# Patient Record
Sex: Female | Born: 1950
Health system: Southern US, Community
[De-identification: ages and names within clinical notes are randomized; demographics above are authoritative.]

## PROBLEM LIST (undated history)

## (undated) DIAGNOSIS — F1320 Sedative, hypnotic or anxiolytic dependence, uncomplicated: Secondary | ICD-10-CM

## (undated) DIAGNOSIS — Z79899 Other long term (current) drug therapy: Secondary | ICD-10-CM

## (undated) DIAGNOSIS — R51 Headache: Secondary | ICD-10-CM

## (undated) DIAGNOSIS — L309 Dermatitis, unspecified: Secondary | ICD-10-CM

## (undated) DIAGNOSIS — Z780 Asymptomatic menopausal state: Secondary | ICD-10-CM

## (undated) DIAGNOSIS — Z8719 Personal history of other diseases of the digestive system: Secondary | ICD-10-CM

## (undated) DIAGNOSIS — T7840XA Allergy, unspecified, initial encounter: Secondary | ICD-10-CM

## (undated) DIAGNOSIS — K2960 Other gastritis without bleeding: Secondary | ICD-10-CM

## (undated) DIAGNOSIS — M353 Polymyalgia rheumatica: Secondary | ICD-10-CM

## (undated) DIAGNOSIS — I10 Essential (primary) hypertension: Secondary | ICD-10-CM

## (undated) DIAGNOSIS — K602 Anal fissure, unspecified: Secondary | ICD-10-CM

## (undated) DIAGNOSIS — Z9889 Other specified postprocedural states: Secondary | ICD-10-CM

## (undated) HISTORY — DX: Other long term (current) drug therapy: Z79.899

## (undated) HISTORY — DX: Essential (primary) hypertension: I10

## (undated) HISTORY — DX: Headache: R51

## (undated) HISTORY — DX: Sedative, hypnotic or anxiolytic dependence, uncomplicated: F13.20

## (undated) HISTORY — PX: NO PAST SURGERIES: SHX2092

## (undated) HISTORY — DX: Other gastritis without bleeding: K29.60

## (undated) HISTORY — DX: Other specified postprocedural states: Z98.890

## (undated) HISTORY — DX: Asymptomatic menopausal state: Z78.0

## (undated) HISTORY — DX: Polymyalgia rheumatica: M35.3

## (undated) HISTORY — DX: Dermatitis, unspecified: L30.9

## (undated) HISTORY — DX: Personal history of other diseases of the digestive system: Z87.19

## (undated) HISTORY — DX: Allergy, unspecified, initial encounter: T78.40XA

## (undated) HISTORY — DX: Anal fissure, unspecified: K60.2

---

## 2004-07-27 ENCOUNTER — Ambulatory Visit: Payer: Self-pay | Admitting: Family Medicine

## 2004-11-30 ENCOUNTER — Ambulatory Visit: Payer: Self-pay | Admitting: Family Medicine

## 2004-12-15 ENCOUNTER — Ambulatory Visit: Payer: Self-pay | Admitting: Family Medicine

## 2005-05-10 ENCOUNTER — Ambulatory Visit: Payer: Self-pay | Admitting: Family Medicine

## 2005-09-13 ENCOUNTER — Ambulatory Visit: Payer: Self-pay | Admitting: Family Medicine

## 2005-12-13 ENCOUNTER — Ambulatory Visit: Payer: Self-pay | Admitting: Family Medicine

## 2005-12-27 ENCOUNTER — Ambulatory Visit: Payer: Self-pay | Admitting: Family Medicine

## 2006-06-13 ENCOUNTER — Ambulatory Visit: Payer: Self-pay | Admitting: Family Medicine

## 2006-12-28 ENCOUNTER — Ambulatory Visit: Payer: Self-pay | Admitting: Family Medicine

## 2007-06-05 DIAGNOSIS — R519 Headache, unspecified: Secondary | ICD-10-CM | POA: Insufficient documentation

## 2007-06-05 DIAGNOSIS — R51 Headache: Secondary | ICD-10-CM | POA: Insufficient documentation

## 2007-07-04 ENCOUNTER — Telehealth: Payer: Self-pay | Admitting: Family Medicine

## 2007-07-17 ENCOUNTER — Telehealth: Payer: Self-pay | Admitting: Family Medicine

## 2007-08-08 ENCOUNTER — Telehealth: Payer: Self-pay | Admitting: Family Medicine

## 2007-08-09 ENCOUNTER — Encounter: Payer: Self-pay | Admitting: Family Medicine

## 2007-08-29 ENCOUNTER — Ambulatory Visit: Payer: Self-pay | Admitting: Family Medicine

## 2007-08-29 DIAGNOSIS — G4700 Insomnia, unspecified: Secondary | ICD-10-CM | POA: Insufficient documentation

## 2007-08-29 DIAGNOSIS — I10 Essential (primary) hypertension: Secondary | ICD-10-CM | POA: Insufficient documentation

## 2007-08-29 DIAGNOSIS — F411 Generalized anxiety disorder: Secondary | ICD-10-CM | POA: Insufficient documentation

## 2007-09-25 ENCOUNTER — Telehealth: Payer: Self-pay | Admitting: Family Medicine

## 2007-10-17 ENCOUNTER — Telehealth: Payer: Self-pay | Admitting: Family Medicine

## 2007-10-24 ENCOUNTER — Telehealth: Payer: Self-pay | Admitting: Family Medicine

## 2007-11-15 ENCOUNTER — Telehealth: Payer: Self-pay | Admitting: Family Medicine

## 2007-11-28 ENCOUNTER — Telehealth: Payer: Self-pay | Admitting: Family Medicine

## 2007-12-11 ENCOUNTER — Telehealth: Payer: Self-pay | Admitting: Family Medicine

## 2008-01-10 ENCOUNTER — Telehealth: Payer: Self-pay | Admitting: Family Medicine

## 2008-02-06 ENCOUNTER — Ambulatory Visit: Payer: Self-pay | Admitting: Family Medicine

## 2008-03-05 ENCOUNTER — Telehealth: Payer: Self-pay | Admitting: Family Medicine

## 2008-03-19 ENCOUNTER — Ambulatory Visit: Payer: Self-pay | Admitting: Family Medicine

## 2008-03-19 DIAGNOSIS — N390 Urinary tract infection, site not specified: Secondary | ICD-10-CM | POA: Insufficient documentation

## 2008-03-24 LAB — CONVERTED CEMR LAB
ALT: 12 units/L (ref 0–35)
AST: 17 units/L (ref 0–37)
Alkaline Phosphatase: 61 units/L (ref 39–117)
BUN: 17 mg/dL (ref 6–23)
Basophils Absolute: 0 10*3/uL (ref 0.0–0.1)
Basophils Relative: 0.2 % (ref 0.0–1.0)
CO2: 33 meq/L — ABNORMAL HIGH (ref 19–32)
Chloride: 98 meq/L (ref 96–112)
Creatinine, Ser: 0.9 mg/dL (ref 0.4–1.2)
Direct LDL: 136.1 mg/dL
Eosinophils Absolute: 0 10*3/uL (ref 0.0–0.7)
Eosinophils Relative: 0.2 % (ref 0.0–5.0)
GFR calc non Af Amer: 69 mL/min
HDL: 137 mg/dL (ref >39.0)
Lymphocytes Relative: 18.9 % (ref 12.0–46.0)
Neutrophils Relative %: 74.6 % (ref 43.0–77.0)
Platelets: 380 10*3/uL (ref 150–400)
Potassium: 3.7 meq/L (ref 3.5–5.1)
RBC: 3.67 M/uL — ABNORMAL LOW (ref 3.87–5.11)
Total Bilirubin: 0.8 mg/dL (ref 0.3–1.2)
Triglycerides: 55 mg/dL (ref 0–149)
VLDL: 11 mg/dL (ref 0–40)
WBC: 7 10*3/uL (ref 4.5–10.5)

## 2008-05-01 ENCOUNTER — Telehealth: Payer: Self-pay | Admitting: Family Medicine

## 2008-06-04 ENCOUNTER — Telehealth: Payer: Self-pay | Admitting: Family Medicine

## 2008-09-03 ENCOUNTER — Telehealth: Payer: Self-pay | Admitting: Family Medicine

## 2008-09-04 ENCOUNTER — Ambulatory Visit: Payer: Self-pay | Admitting: Family Medicine

## 2008-11-19 ENCOUNTER — Telehealth: Payer: Self-pay | Admitting: Family Medicine

## 2009-01-29 ENCOUNTER — Encounter: Payer: Self-pay | Admitting: Family Medicine

## 2009-01-29 ENCOUNTER — Telehealth: Payer: Self-pay | Admitting: Family Medicine

## 2009-02-03 ENCOUNTER — Telehealth: Payer: Self-pay

## 2009-02-04 ENCOUNTER — Telehealth: Payer: Self-pay | Admitting: Family Medicine

## 2009-02-11 ENCOUNTER — Telehealth: Payer: Self-pay | Admitting: Family Medicine

## 2009-03-05 ENCOUNTER — Telehealth: Payer: Self-pay | Admitting: Family Medicine

## 2009-03-11 ENCOUNTER — Ambulatory Visit: Payer: Self-pay | Admitting: Family Medicine

## 2009-07-08 ENCOUNTER — Telehealth: Payer: Self-pay | Admitting: Family Medicine

## 2009-09-01 ENCOUNTER — Ambulatory Visit: Payer: Self-pay | Admitting: Family Medicine

## 2009-09-01 LAB — CONVERTED CEMR LAB
Bilirubin Urine: NEGATIVE
Blood in Urine, dipstick: NEGATIVE
Glucose, Urine, Semiquant: NEGATIVE
Ketones, urine, test strip: NEGATIVE
Protein, U semiquant: NEGATIVE
Urobilinogen, UA: 0.2
pH: 6.5

## 2009-09-02 ENCOUNTER — Encounter: Payer: Self-pay | Admitting: Family Medicine

## 2009-09-02 LAB — CONVERTED CEMR LAB
Albumin: 4.5 g/dL (ref 3.5–5.2)
Alkaline Phosphatase: 82 units/L (ref 39–117)
BUN: 23 mg/dL (ref 6–23)
Basophils Absolute: 0 10*3/uL (ref 0.0–0.1)
Chloride: 101 meq/L (ref 96–112)
Cholesterol: 267 mg/dL — ABNORMAL HIGH (ref 0–200)
Creatinine, Ser: 0.9 mg/dL (ref 0.4–1.2)
Direct LDL: 150.3 mg/dL
Eosinophils Absolute: 0 10*3/uL (ref 0.0–0.7)
GFR calc non Af Amer: 68.72 mL/min (ref >60)
Glucose, Bld: 90 mg/dL (ref 70–99)
HDL: 96.7 mg/dL (ref >39.00)
Hemoglobin: 14.4 g/dL (ref 12.0–15.0)
Lymphocytes Relative: 18.9 % (ref 12.0–46.0)
MCHC: 34.1 g/dL (ref 30.0–36.0)
Neutro Abs: 5 10*3/uL (ref 1.4–7.7)
Phosphorus: 3.7 mg/dL (ref 2.3–4.6)
Platelets: 250 10*3/uL (ref 150.0–400.0)
RDW: 12.2 % (ref 11.5–14.6)
TSH: 0.86 microintl units/mL (ref 0.35–5.50)
Total Bilirubin: 1 mg/dL (ref 0.3–1.2)
Total CHOL/HDL Ratio: 3
Triglycerides: 61 mg/dL (ref 0.0–149.0)
VLDL: 12.2 mg/dL (ref 0.0–40.0)

## 2009-11-11 ENCOUNTER — Telehealth: Payer: Self-pay | Admitting: Family Medicine

## 2010-02-16 ENCOUNTER — Ambulatory Visit: Payer: Self-pay | Admitting: Family Medicine

## 2010-02-16 DIAGNOSIS — E559 Vitamin D deficiency, unspecified: Secondary | ICD-10-CM | POA: Insufficient documentation

## 2010-02-16 DIAGNOSIS — E785 Hyperlipidemia, unspecified: Secondary | ICD-10-CM | POA: Insufficient documentation

## 2010-02-16 DIAGNOSIS — T50995A Adverse effect of other drugs, medicaments and biological substances, initial encounter: Secondary | ICD-10-CM | POA: Insufficient documentation

## 2010-02-23 ENCOUNTER — Telehealth (INDEPENDENT_AMBULATORY_CARE_PROVIDER_SITE_OTHER): Payer: Self-pay | Admitting: *Deleted

## 2010-02-23 LAB — CONVERTED CEMR LAB
Cholesterol: 242 mg/dL — ABNORMAL HIGH (ref 0–200)
Triglycerides: 44 mg/dL (ref 0.0–149.0)
VLDL: 8.8 mg/dL (ref 0.0–40.0)
Vit D, 25-Hydroxy: 24 ng/mL — ABNORMAL LOW (ref 30–89)

## 2010-07-27 ENCOUNTER — Ambulatory Visit: Payer: Self-pay | Admitting: Family Medicine

## 2010-07-28 LAB — CONVERTED CEMR LAB
ALT: 18 units/L (ref 0–35)
AST: 21 units/L (ref 0–37)
Bilirubin, Direct: 0.1 mg/dL (ref 0.0–0.3)
Creatinine, Ser: 0.8 mg/dL (ref 0.4–1.2)
Eosinophils Relative: 0.4 % (ref 0.0–5.0)
GFR calc non Af Amer: 83.26 mL/min (ref >60)
Glucose, Bld: 94 mg/dL (ref 70–99)
HDL: 109.5 mg/dL (ref >39.00)
Lymphocytes Relative: 19.2 % (ref 12.0–46.0)
Monocytes Absolute: 0.3 10*3/uL (ref 0.1–1.0)
Monocytes Relative: 5.1 % (ref 3.0–12.0)
Neutrophils Relative %: 75 % (ref 43.0–77.0)
Platelets: 248 10*3/uL (ref 150.0–400.0)
Sodium: 140 meq/L (ref 135–145)
Total Bilirubin: 0.8 mg/dL (ref 0.3–1.2)
Total CHOL/HDL Ratio: 2
Triglycerides: 73 mg/dL (ref 0.0–149.0)
Vit D, 25-Hydroxy: 40 ng/mL (ref 30–89)
WBC: 6.1 10*3/uL (ref 4.5–10.5)

## 2010-09-22 ENCOUNTER — Telehealth: Payer: Self-pay | Admitting: Family Medicine

## 2010-09-23 ENCOUNTER — Telehealth: Payer: Self-pay | Admitting: Family Medicine

## 2010-09-28 ENCOUNTER — Telehealth: Payer: Self-pay | Admitting: Family Medicine

## 2010-10-05 ENCOUNTER — Telehealth: Payer: Self-pay | Admitting: Family Medicine

## 2010-10-26 NOTE — Progress Notes (Signed)
Summary: Pt has several questions re: bp , Enteric Aspirin, Lisinopril  Phone Note Call from Patient Call back at Home Phone [phone]   Caller: Patient Summary of Call: Pt called and wants to know when she should have her bp rechecked? Also pt is suppose to be taking Enteric Aspirin 81mg  low dose, but pt hasnt started taking it yet because she needs to know if it will interfere with other meds? Pt is also wondering if she can discontinue Lisinopril, since her bp reading are a lot better?   Initial call taken by: Lucy Antigua,  November 11, 2009 1:52 PM  Follow-up for Phone Call        will need 3 month  ck up sometime in march and to cont her meds til seen and discusssed at time of visit.  Follow-up by: Pura Spice, RN,  November 11, 2009 2:11 PM    Prescriptions: LISINOPRIL-HYDROCHLOROTHIAZIDE 20-12.5 MG  TABS (LISINOPRIL-HYDROCHLOROTHIAZIDE) 1 once daily for blood pressure  #60 x 0   Entered by:   Lynann Beaver CMA   Authorized by:   Judithann Sheen MD   Signed by:   Lynann Beaver CMA on 11/11/2009   Method used:   [facility]        [facility] [phone][facility] (retail[facility]       [address] [place], [place]  [address]       Ph: [phone] or [phone]       Fax: [phone]   RxID:   [id number]

## 2010-10-26 NOTE — Progress Notes (Signed)
Summary: Re: Vit D  Phone Note Outgoing Call   Initial call taken by: Kathrynn Speed CMA,  Feb 23, 2010 1:47 PM Call placed by: Kathrynn Speed CMA,  Feb 23, 2010 1:47 PM Summary of Call: Per Dr. Cruzita Lederer D is Low Prescription at Drug store take one cap a week for 12 weeks, Lipids OK No Meds needed & continue with Caltrate  Initial call taken by: Kathrynn Speed CMA,  Feb 23, 2010 1:49 PM

## 2010-10-26 NOTE — Assessment & Plan Note (Signed)
Summary: follow up on cholesterol and meds/cjr   Vital Signs:  Patient profile:   60 year old female Weight:      125 pounds BMI:     22.22 O2 Sat:      92 % Temp:     98.1 degrees F Pulse rate:   100 / minute BP sitting:   120 / 80  (left arm)  Vitals Entered By: Pura Spice, RN (Feb 16, 2010 11:17 AM) CC: ? reck chloesterol and vit d level and refill all meds  Is Patient Diabetic? No   History of Present Illness: This 60 year old white married female with chronic tension headaches is in for refill of medications as well as to recheck her cholesterol level. She relates her anxiety depression and crit improved but continues to have headaches. Continues to need medication for insomnia Blood pressure is under good control 2 over laboratory studies  Allergies (verified): No Known Drug Allergies  Past History:  Past Surgical History: Last updated: 06/05/2007 Denies surgical history  Social History: Last updated: 06/05/2007 Occupation: Married  Past Medical History: Eczema Headache benigh essential hyperytension  Review of Systems      See HPI  The patient denies anorexia, fever, weight loss, weight gain, vision loss, decreased hearing, hoarseness, chest pain, syncope, dyspnea on exertion, peripheral edema, prolonged cough, headaches, hemoptysis, abdominal pain, melena, hematochezia, severe indigestion/heartburn, hematuria, incontinence, genital sores, muscle weakness, suspicious skin lesions, transient blindness, difficulty walking, depression, unusual weight change, abnormal bleeding, enlarged lymph nodes, angioedema, breast masses, and testicular masses.    Physical Exam  General:  Well-developed,well-nourished,in no acute distress; alert,appropriate and cooperative throughout examination Head:  Normocephalic and atraumatic without obvious abnormalities. No apparent alopecia or balding. Eyes:  No corneal or conjunctival inflammation noted. EOMI. Perrla. Funduscopic  exam benign, without hemorrhages, exudates or papilledema. Vision grossly normal. Ears:  External ear exam shows no significant lesions or deformities.  Otoscopic examination reveals clear canals, tympanic membranes are intact bilaterally without bulging, retraction, inflammation or discharge. Hearing is grossly normal bilaterally. Nose:  External nasal examination shows no deformity or inflammation. Nasal mucosa are pink and moist without lesions or exudates. Mouth:  Oral mucosa and oropharynx without lesions or exudates.  Teeth in good repair. Breasts:  gynecologist examined Lungs:  Normal respiratory effort, chest expands symmetrically. Lungs are clear to auscultation, no crackles or wheezes. Heart:  Normal rate and regular rhythm. S1 and S2 normal without gallop, murmur, click, rub or other extra sounds. Abdomen:  Bowel sounds positive,abdomen soft and non-tender without masses, organomegaly or hernias noted. Rectal:  GYN Genitalia:  GYN Extremities:  No clubbing, cyanosis, edema, or deformity noted with normal full range of motion of all joints.   Neurologic:  No cranial nerve deficits noted. Station and gait are normal. Plantar reflexes are down-going bilaterally. DTRs are symmetrical throughout. Sensory, motor and coordinative functions appear intact.   Impression & Recommendations:  Problem # 1:  HYPERTENSION, BENIGN (ICD-401.1) Assessment Improved  Her updated medication list for this problem includes:    Lisinopril-hydrochlorothiazide 20-12.5 Mg Tabs (Lisinopril-hydrochlorothiazide) .Marland Kitchen... 1 once daily for blood pressure  Problem # 2:  ANXIETY STATE, UNSPECIFIED (ICD-300.00) Assessment: Improved  Her updated medication list for this problem includes:    Alprazolam 1 Mg Tabs (Alprazolam) .Marland Kitchen... Take 1 tablet four times a day as needed stress and anxiety. no early refill  Problem # 3:  HEADACHE (ICD-784.0) Assessment: Unchanged  Her updated medication list for this problem  includes:    Butalbital-apap-caffeine  50-325-40 Mg Tabs (Butalbital-apap-caffeine) .Marland Kitchen... Take two  tabs every 6-8 hrs as needed migraines. do not take over 4 per day    Aspirin 81 Mg Tbec (Aspirin) .Marland Kitchen... 1 qd  Problem # 4:  INSOMNIA (ICD-780.52) Assessment: Unchanged  Her updated medication list for this problem includes:    Temazepam 30 Mg Caps (Temazepam) .Marland Kitchen... 1 by mouth hs as needed sleep  Complete Medication List: 1)  Butalbital-apap-caffeine 50-325-40 Mg Tabs (Butalbital-apap-caffeine) .... Take two  tabs every 6-8 hrs as needed migraines. do not take over 4 per day 2)  Lisinopril-hydrochlorothiazide 20-12.5 Mg Tabs (Lisinopril-hydrochlorothiazide) .Marland Kitchen.. 1 once daily for blood pressure 3)  Alprazolam 1 Mg Tabs (Alprazolam) .... Take 1 tablet four times a day as needed stress and anxiety. no early refill 4)  Temazepam 30 Mg Caps (Temazepam) .Marland Kitchen.. 1 by mouth hs as needed sleep 5)  Aspirin 81 Mg Tbec (Aspirin) .Marland Kitchen.. 1 qd 6)  Vitamin D (ergocalciferol) 50000 Unit Caps (Ergocalciferol) .Marland KitchenMarland KitchenMarland Kitchen 1 weekly x 12 weeks  Other Orders: Venipuncture (09811) T-Vitamin D (25-Hydroxy) ([address]1478-29562) TLB-Lipid Panel (80061-LIPID)  Patient Instructions: 1)  we'll call results of lab on the lateral 2)  Will refill your medications for blood pressure headaches anxiety depression and insomnia Prescriptions: VITAMIN D (ERGOCALCIFEROL) 50000 UNIT CAPS (ERGOCALCIFEROL) 1 weekly x 12 weeks  #12 x 1   Entered and Authorized by:   Judithann Sheen MD   Signed by:   Judithann Sheen MD on 02/23/2010   Method used:   Electronically to        [facility] [phone]* (retail)       [address] [place], [place]  [address]       Ph: [phone] or [phone]       Fax: [phone]   RxID:   [id number] TEMAZEPAM 30 MG  CAPS (TEMAZEPAM) 1 by mouth hs as needed sleep  #30 x 5   Entered and Authorized by:   Judithann Sheen MD   Signed by:   Judithann Sheen MD on 02/16/2010   Method  used:   Print then Give to Patient   RxID:   [id number] ALPRAZOLAM 1 MG  TABS (ALPRAZOLAM) TAKE 1 TABLET FOUR TIMES A DAY as needed stress and anxiety. NO EARLY REFILL  #120 x 5   Entered and Authorized by:   Judithann Sheen MD   Signed by:   Judithann Sheen MD on 02/16/2010   Method used:   Print then Give to Patient   RxID:   [id number] LISINOPRIL-HYDROCHLOROTHIAZIDE 20-12.5 MG  TABS (LISINOPRIL-HYDROCHLOROTHIAZIDE) 1 once daily for blood pressure  #30 x 11   Entered and Authorized by:   Judithann Sheen MD   Signed by:   Judithann Sheen MD on 02/16/2010   Method used:   Electronically to        [facility] [phone]* (retail)       [address] [place], [place]  [address]       Ph: [phone] or [phone]       Fax: [phone]   RxID:   [id number] BUTALBITAL-APAP-CAFFEINE 50-325-40 MG TABS (BUTALBITAL-APAP-CAFFEINE) TAKE TWO  TABS EVERY 6-8 HRS as needed MIGRAINES. DO Not take over 4 per day  #120 x 5   Entered and Authorized by:   Judithann Sheen MD   Signed  by:   Judithann Sheen MD on 02/16/2010   Method used:   Print then Give to Patient   RxID:   [id number]

## 2010-10-26 NOTE — Assessment & Plan Note (Signed)
Summary: MED CK / REFILL (PT WILL COME IN FASTING FOR POTENTIAL LABS) ...   Vital Signs:  Patient profile:   60 year old female Height:      63 inches (160.02 cm) Weight:      131 pounds (59.55 kg) O2 Sat:      99 % on Room air Temp:     97.6 degrees F (36.44 degrees C) oral Pulse rate:   87 / minute BP sitting:   142 / 82  (left arm) Cuff size:   regular  Vitals Entered By: Josph Macho RMA (July 27, 2010 10:27 AM)  O2 Flow:  Room air CC: Med check/ CF Is Patient Diabetic? No   History of Present Illness: is a 60 year old Caucasian female in today for reevaluation of her medications and medical problems. She denies any recent illness, fevers, chills, congestion, chest pain, palpitations, shortness of breath, GI or GU complaints. She takes alprazolam 4 times a day for ongoing stress and temazepam at bedtime which helps her sleep. She declines SSRIs and says she's not tried them. Denies sedation with this alprazolam and feels it is working well.  Current Medications (verified): 1)  Butalbital-Apap-Caffeine 50-325-40 Mg Tabs (Butalbital-Apap-Caffeine) .... Take Two  Tabs Every 6-8 Hrs As Needed Migraines. Do Not Take Over 4 Per Day 2)  Lisinopril-Hydrochlorothiazide 20-12.5 Mg  Tabs (Lisinopril-Hydrochlorothiazide) .Marland Kitchen.. 1 Once Daily For Blood Pressure 3)  Alprazolam 1 Mg  Tabs (Alprazolam) .... Take 1 Tablet Four Times A Day As Needed Stress and Anxiety. No Early Refill 4)  Temazepam 30 Mg  Caps (Temazepam) .Marland Kitchen.. 1 By Mouth Hs As Needed Sleep 5)  Aspirin 81 Mg Tbec (Aspirin) .Marland Kitchen.. 1 Qd 6)  Vitamin D (Ergocalciferol) 50000 Unit Caps (Ergocalciferol) .Marland Kitchen.. 1 Weekly X 12 Weeks  Allergies (verified): No Known Drug Allergies  Past History:  Past medical history reviewed for relevance to current acute and chronic problems. Social history (including risk factors) reviewed for relevance to current acute and chronic problems.  Past Medical History: Reviewed history from 02/16/2010  and no changes required. Eczema Headache benigh essential hyperytension  Social History: Reviewed history from 06/05/2007 and no changes required. Occupation: Married  Review of Systems      See HPI  Physical Exam  General:  Well-developed,well-nourished,in no acute distress; alert,appropriate and cooperative throughout examination Head:  Normocephalic and atraumatic without obvious abnormalities. No apparent alopecia or balding. Mouth:  Oral mucosa and oropharynx without lesions or exudates.  Teeth in good repair. Neck:  No deformities, masses, or tenderness noted. Lungs:  Normal respiratory effort, chest expands symmetrically. Lungs are clear to auscultation, no crackles or wheezes. Heart:  Normal rate and regular rhythm. S1 and S2 normal without gallop, murmur, click, rub or other extra sounds. Abdomen:  Bowel sounds positive,abdomen soft and non-tender without masses, organomegaly or hernias noted. Extremities:  No clubbing, cyanosis, edema, or deformity noted with normal full range of motion of all joints.   Cervical Nodes:  No lymphadenopathy noted Psych:  Cognition and judgment appear intact. Alert and cooperative with normal attention span and concentration. No apparent delusions, illusions, hallucinations   Impression & Recommendations:  Problem # 1:  HYPERLIPIDEMIA (ICD-272.4)  Orders: Venipuncture (09811) Specimen Handling (91478) TLB-Lipid Panel (80061-LIPID) Avoid trans fats, increase exercise  Problem # 2:  HYPERTENSION, BENIGN (ICD-401.1)  Her updated medication list for this problem includes:    Lisinopril-hydrochlorothiazide 20-12.5 Mg Tabs (Lisinopril-hydrochlorothiazide) .Marland Kitchen... 1 once daily for blood pressure  Orders: Venipuncture (29562) Specimen Handling (13086)  TLB-Renal Function Panel (80069-RENAL) TLB-CBC Platelet - w/Differential (85025-CBCD) TLB-Hepatic/Liver Function Pnl (80076-HEPATIC) TLB-TSH (Thyroid Stimulating Hormone)  (84443-TSH) Improved with repeat eval  Problem # 3:  INSOMNIA (ICD-780.52)  Her updated medication list for this problem includes:    Temazepam 30 Mg Caps (Temazepam) .Marland Kitchen... 1 by mouth hs as needed sleep Reports Temazepam is helpful, refill given  Problem # 4:  ANXIETY STATE, UNSPECIFIED (ICD-300.00)  Her updated medication list for this problem includes:    Alprazolam 1 Mg Tabs (Alprazolam) .Marland Kitchen... Take 1 tablet four times a day as needed stress and anxiety. no early refill Patient encouraged to consider an SSRI, patient declines, will discuss with PMD when he returns  Complete Medication List: 1)  Butalbital-apap-caffeine 50-325-40 Mg Tabs (Butalbital-apap-caffeine) .... Take two  tabs every 6-8 hrs as needed migraines. do not take over 4 per day 2)  Lisinopril-hydrochlorothiazide 20-12.5 Mg Tabs (Lisinopril-hydrochlorothiazide) .Marland Kitchen.. 1 once daily for blood pressure 3)  Alprazolam 1 Mg Tabs (Alprazolam) .... Take 1 tablet four times a day as needed stress and anxiety. no early refill 4)  Temazepam 30 Mg Caps (Temazepam) .Marland Kitchen.. 1 by mouth hs as needed sleep 5)  Aspirin 81 Mg Tbec (Aspirin) .Marland Kitchen.. 1 qd 6)  Vitamin D (ergocalciferol) 50000 Unit Caps (Ergocalciferol) .Marland KitchenMarland KitchenMarland Kitchen 1 weekly x 12 weeks 7)  Citracal Petites/vitamin D 200-250 Mg-unit Tabs (Calcium citrate-vitamin d) .Marland Kitchen.. 1 tab by mouth two times a day  Other Orders: T-Vitamin D (25-Hydroxy) ([address])  Patient Instructions: 1)  Please schedule a follow-up appointment in 2 to 3 months or sooner as needed Prescriptions: TEMAZEPAM 30 MG  CAPS (TEMAZEPAM) 1 by mouth hs as needed sleep  #30 x 1   Entered and Authorized by:   Danise Edge MD   Signed by:   Danise Edge MD on 07/27/2010   Method used:   Print then Give to Patient   RxID:   [id number] ALPRAZOLAM 1 MG  TABS (ALPRAZOLAM) TAKE 1 TABLET FOUR TIMES A DAY as needed stress and anxiety. NO EARLY REFILL  #120 x 1   Entered and Authorized by:   Danise Edge MD   Signed by:    Danise Edge MD on 07/27/2010   Method used:   Print then Give to Patient   RxID:   [id number] BUTALBITAL-APAP-CAFFEINE 50-325-40 MG TABS (BUTALBITAL-APAP-CAFFEINE) TAKE TWO  TABS EVERY 6-8 HRS as needed MIGRAINES. DO Not take over 4 per day  #120 x 1   Entered and Authorized by:   Danise Edge MD   Signed by:   Danise Edge MD on 07/27/2010   Method used:   Print then Give to Patient   RxID:   [id number]    Orders Added: 1)  T-Vitamin D (25-Hydroxy) [44034-74259] 2)  Venipuncture [56387] 3)  Specimen Handling [99000] 4)  TLB-Lipid Panel [80061-LIPID] 5)  TLB-Renal Function Panel [80069-RENAL] 6)  TLB-CBC Platelet - w/Differential [85025-CBCD] 7)  TLB-Hepatic/Liver Function Pnl [80076-HEPATIC] 8)  TLB-TSH (Thyroid Stimulating Hormone) [84443-TSH] 9)  Est. Patient Level IV [56433]

## 2010-10-28 NOTE — Progress Notes (Signed)
Summary: Pt called re: [facility] [place]  Phone Note Outgoing Call   Call placed by: Kyung Rudd, CMA,  September 28, 2010 4:38 PM Call placed to: Patient Summary of Call: left message for pt to call back with pharmacy information Initial call taken by: Kyung Rudd, CMA,  September 28, 2010 4:39 PM  Follow-up for Phone Call        Pt says she is out of town and to pls call med into [facility] in [place], [place] [phone]      If there are any questions, you can contact pt at [phone] cell. Follow-up by: Lucy Antigua,  September 29, 2010 1:26 PM  Additional Follow-up for Phone Call Additional follow up Details #1::        Phone call completed, rx called in  Additional Follow-up by: Alfred Levins, CMA,  September 30, 2010 11:20 AM    Prescriptions: BUTALBITAL-APAP-CAFFEINE 50-325-40 MG TABS (BUTALBITAL-APAP-CAFFEINE) TAKE TWO  TABS EVERY 6-8 HRS as needed MIGRAINES. DO Not take over 4 per day  #30 x 0   Entered by:   Alfred Levins, CMA   Authorized by:   Judithann Sheen MD   Signed by:   Alfred Levins, CMA on 09/30/2010   Method used:   Telephoned to ...       [facility]  [place] [phone]* (retail)       [address] [place], [place]  [address]       Ph: [phone] or [phone]       Fax: [phone]   RxID:   [id number]

## 2010-10-28 NOTE — Progress Notes (Signed)
Summary: Pt req refill of [facility]albital to [facility] in [place] by 09/29/10  Phone Note Refill Request Call back at Home Phone [phone] Message from:  Patient on September 22, 2010 1:43 PM  Refills Requested: Medication #1:  BUTALBITAL-APAP-CAFFEINE 50-325-40 MG TABS TAKE TWO  TABS EVERY 6-8 HRS as needed MIGRAINES. DO Not take over 4 per day   Dosage confirmed as above?Dosage Confirmed   Supply Requested: 1 month Pls call this into [facility] [address] [facility] [place] [phone] by 09/29/10, at the latest.  Pt would like this med to have a few refills on it, so that it will last until her appt with Dr Scotty Court in March 2012   Method Requested: Telephone to Pharmacy Initial call taken by: Lucy Antigua,  September 22, 2010 1:42 PM  Follow-up for Phone Call        denied per dr Scotty Court until 09/29/10 Follow-up by: Kern Reap CMA Duncan Dull),  September 23, 2010 12:47 PM  Additional Follow-up for Phone Call Additional follow up Details #1::        Pt has been informed.  Additional Follow-up by: Lucy Antigua,  September 23, 2010 3:34 PM

## 2010-10-28 NOTE — Progress Notes (Signed)
Summary: Pt has questions re: Butalbital on 09/29/10.   Phone Note Call from Patient Call back at [facility] Phone [phone]   Summary of Call: Pt called and wanted to know, if the refill of Butalbital for 09/29/10 could have some refills put on it to last until her appt in March? Also wanted to know if she could get refills put on scripts for Alprazolam and Temazepam as well. Pt said that Dr Abner Greenspan only gave pt one script for these, with no refills. Pls advise.  Initial call taken by: Lucy Antigua,  September 23, 2010 4:03 PM     Appended Document: Pt has questions re: Butalbital on 09/29/10.     Prescriptions: TEMAZEPAM 30 MG  CAPS (TEMAZEPAM) 1 by mouth hs as needed sleep  #30 x 3   Entered by:   Alfred Levins, CMA   Authorized by:   Judithann Sheen MD   Signed by:   Alfred Levins, CMA on 09/30/2010   Method used:   Telephoned to ...       [facility]  [place] [phone]* (retail)       [address] [place], [place]  [address]       Ph: [phone] or [phone]       Fax: [phone]   RxID:   [id number] ALPRAZOLAM 1 MG  TABS (ALPRAZOLAM) TAKE 1 TABLET FOUR TIMES A DAY as needed stress and anxiety. NO EARLY REFILL  #120 x 3   Entered by:   Alfred Levins, CMA   Authorized by:   Judithann Sheen MD   Signed by:   Alfred Levins, CMA on 09/30/2010   Method used:   Telephoned to ...       [facility]  [place] [phone]* (retail)       [address] [place], [place]  [address]       Ph: [phone] or [phone]       Fax: [phone]   RxID:   [id number]

## 2010-10-28 NOTE — Progress Notes (Signed)
Summary: med refill  Phone Note Refill Request Message from:  [facility] [address]  Refills Requested: Medication #1:  BUTALBITAL-APAP-CAFFEINE 50-325-40 MG TABS TAKE TWO  TABS EVERY 6-8 HRS as needed MIGRAINES. DO Not take over 4 per day pt needs #120 for one month supply. [facility] call [facility] [address] [facility] [place]  Initial call taken by: Heron Sabins,  October 05, 2010 11:30 AM  Follow-up for Phone Call        Rx called to pharmacy Follow-up by: Alfred Levins, CMA,  October 05, 2010 2:34 PM

## 2010-11-30 ENCOUNTER — Encounter: Payer: Self-pay | Admitting: Family Medicine

## 2010-11-30 ENCOUNTER — Ambulatory Visit (INDEPENDENT_AMBULATORY_CARE_PROVIDER_SITE_OTHER): Payer: BC Managed Care – PPO | Admitting: Family Medicine

## 2010-11-30 ENCOUNTER — Telehealth: Payer: Self-pay | Admitting: Family Medicine

## 2010-11-30 VITALS — BP 120/60 | HR 98 | Temp 98.5°F | Resp 16 | Wt 131.0 lb

## 2010-11-30 DIAGNOSIS — F411 Generalized anxiety disorder: Secondary | ICD-10-CM

## 2010-11-30 DIAGNOSIS — F419 Anxiety disorder, unspecified: Secondary | ICD-10-CM

## 2010-11-30 DIAGNOSIS — R51 Headache: Secondary | ICD-10-CM

## 2010-11-30 DIAGNOSIS — G4700 Insomnia, unspecified: Secondary | ICD-10-CM

## 2010-11-30 MED ORDER — ALPRAZOLAM 1 MG PO TABS: 1.0000 mg | ORAL_TABLET | Freq: Four times a day (QID) | ORAL | Status: DC | PRN

## 2010-11-30 MED ORDER — LISINOPRIL-HYDROCHLOROTHIAZIDE 20-12.5 MG PO TABS: 1.0000 | ORAL_TABLET | Freq: Every day | ORAL | Status: DC

## 2010-11-30 MED ORDER — BUTALBITAL-APAP-CAFF-COD 50-325-40-30 MG PO CAPS: 2.0000 | ORAL_CAPSULE | Freq: Four times a day (QID) | ORAL | Status: DC | PRN

## 2010-11-30 MED ORDER — TEMAZEPAM 30 MG PO CAPS: 30.0000 mg | ORAL_CAPSULE | Freq: Every evening | ORAL | Status: DC | PRN

## 2010-11-30 NOTE — Patient Instructions (Signed)
Refilled medications, not over 4 fioricet per day Return for physical in November as well as labs

## 2010-11-30 NOTE — Telephone Encounter (Signed)
Pharmacy called and said that pt says that she has always on plain Fioricet. Pls [facility] plain [facility]et [facility] [address] [place].

## 2010-11-30 NOTE — Progress Notes (Signed)
  Subjective:    Patient ID: Brooke Pennington, female    DOB: October 28, 1950, 60 y.o.   MRN: [id number] This 60 yr old emale is in for refill medications. Continue to be very anxious with panic attacks if she does not take alprazalam, also continues to need fioricet for headaches, also needs temazepam for insomnia, no oher complaints. BP well controlled HPI    Review of SystemsSee HPI     Objective:   Physical Exam well nourishe cooperative pleasant in no distress Heent negative Heart normal size, reg rhthym, no murmurs Lungs clear No edema       Assessment & Plan:  To continue  Same treatment, return in November for preventive health exam

## 2010-12-01 ENCOUNTER — Telehealth: Payer: Self-pay | Admitting: Family Medicine

## 2010-12-01 NOTE — Telephone Encounter (Signed)
°  Pt called to ck on status of previous call.... Pts # [id number].

## 2010-12-01 NOTE — Telephone Encounter (Signed)
Triage vm--------please return regarding her Butabitol without codeine. Please return call.

## 2010-12-02 MED ORDER — BUTALBITAL-APAP-CAFFEINE 50-325-40 MG PO TABS: 2.0000 | ORAL_TABLET | Freq: Four times a day (QID) | ORAL | Status: DC | PRN

## 2010-12-02 NOTE — Telephone Encounter (Signed)
Correction from printed rx for fioricet with codeine. Rx for plain Fioricet #120 with 0 refills called in to pharm

## 2010-12-02 NOTE — Telephone Encounter (Signed)
pls contact pharmacy. If did not fill fioricet then ok to call in plain fioricet without codine #120 no rf.

## 2010-12-02 NOTE — Telephone Encounter (Signed)
Pt called back again and said that Dr Scotty Court usually writes scripts for the [facility] without the codeine in it. Pt req the plain Butabitol APAP with Caffeine 50-325-40 mg, unless Dr Scotty Court is wanting to change the med. Pt is completely out of med and needs this taken care of today. [facility] [address] [address] [place] [phone].

## 2010-12-03 NOTE — Telephone Encounter (Signed)
No this needs to come from Dr. Scotty Court next week.

## 2010-12-03 NOTE — Telephone Encounter (Signed)
Left message on machine for patient

## 2011-05-16 ENCOUNTER — Encounter: Payer: Self-pay | Admitting: Family Medicine

## 2011-05-17 ENCOUNTER — Ambulatory Visit (INDEPENDENT_AMBULATORY_CARE_PROVIDER_SITE_OTHER): Payer: BC Managed Care – PPO | Admitting: Family Medicine

## 2011-05-17 ENCOUNTER — Encounter: Payer: Self-pay | Admitting: Family Medicine

## 2011-05-17 VITALS — BP 118/72 | HR 86 | Temp 98.4°F | Wt 136.0 lb

## 2011-05-17 DIAGNOSIS — F411 Generalized anxiety disorder: Secondary | ICD-10-CM

## 2011-05-17 DIAGNOSIS — G4700 Insomnia, unspecified: Secondary | ICD-10-CM

## 2011-05-17 DIAGNOSIS — R51 Headache: Secondary | ICD-10-CM

## 2011-05-17 DIAGNOSIS — G8929 Other chronic pain: Secondary | ICD-10-CM

## 2011-05-17 DIAGNOSIS — F419 Anxiety disorder, unspecified: Secondary | ICD-10-CM

## 2011-05-17 MED ORDER — BUTALBITAL-APAP-CAFFEINE 50-325-40 MG PO TABS: 2.0000 | ORAL_TABLET | Freq: Four times a day (QID) | ORAL | Status: DC | PRN

## 2011-05-17 MED ORDER — ALPRAZOLAM 1 MG PO TABS: 1.0000 mg | ORAL_TABLET | Freq: Four times a day (QID) | ORAL | Status: DC | PRN

## 2011-05-17 MED ORDER — TEMAZEPAM 30 MG PO CAPS: 30.0000 mg | ORAL_CAPSULE | Freq: Every evening | ORAL | Status: DC | PRN

## 2011-05-17 NOTE — Progress Notes (Signed)
  Subjective:    Patient ID: Brooke Pennington, female    DOB: 02/08/1951, 60 y.o.   MRN: [id number] This 60 year old white married female with known history of chronic headaches of anxiety and depression is in for refill of her medications and plans for a physical examination in November She relates she continues to have headaches but no increased frequency or severity and on systems review no other complaints she is under care of a gynecologist for Pap smear HPI    Review of Systems see history of present illness     Objective:   Physical Exam patient is a well-built well-nourished white female in no distress Vital signs normal Heart and lungs reveal no abnormality No peripheral edema        Assessment & Plan:  Exaggerative depression refill alprazolam 1 mg 4 times a day Chronic headaches Fioricet one every 4-6 hours when necessary for pain or headache Insomnia refill temazepam

## 2011-05-17 NOTE — Patient Instructions (Signed)
Have refilled your medications Fioricet temazepam and alprazolam turn in November for physical examination the lab studies

## 2011-08-03 ENCOUNTER — Encounter: Payer: BC Managed Care – PPO | Admitting: Family Medicine

## 2011-10-20 ENCOUNTER — Telehealth: Payer: Self-pay | Admitting: *Deleted

## 2011-10-20 NOTE — Telephone Encounter (Signed)
I do not prescribe this medication for chronic headaches . this is a rescue  Medication that I rarely  Use because it can cause rebound headaches and cause worsening over time  .  I advise referral to  a headache specialist    I wont be able to continue prescribing this regimen.  (I think Dr Sharene Skeans is in [place] and she is an excellent headache specialist)   In the meantime we can refill for  30 pills no refills  .

## 2011-10-20 NOTE — Telephone Encounter (Signed)
Pt is needing a refill on her headache med, butalbital-acetaminophen-caffeine (FIORICET) 50-325-40 MG per tablet. She is former [place] pt who is establishing with you on 2/5 @ 2pm. She uses [facility] on [address] in [place]. If this is problematic, please call pt. Would like to pick this up Sun., Monday if Sunday not possible. Is going out of town next week.

## 2011-10-20 NOTE — Telephone Encounter (Signed)
Pt is asking to speak to [place].  States she talked to [place] several months ago, and was assigned Dr Fabian Sharp as her MD, and needs to discuss this.

## 2011-10-21 ENCOUNTER — Telehealth: Payer: Self-pay | Admitting: *Deleted

## 2011-10-21 MED ORDER — BUTALBITAL-APAP-CAFFEINE 50-325-40 MG PO TABS: 2.0000 | ORAL_TABLET | Freq: Four times a day (QID) | ORAL | Status: DC | PRN

## 2011-10-21 NOTE — Telephone Encounter (Signed)
Left message on machine for patient to return our call.  #30 tabs called in.

## 2011-10-21 NOTE — Telephone Encounter (Signed)
Please call pt back about her meds.  She is confused.

## 2011-10-24 NOTE — Telephone Encounter (Signed)
See previous phone note.  

## 2011-10-24 NOTE — Telephone Encounter (Signed)
Left message to call back  

## 2011-10-24 NOTE — Telephone Encounter (Signed)
Pt aware of this. She is going to think about the Headache Specialist and discuss this with Dr. Fabian Sharp when she comes in.

## 2011-11-01 ENCOUNTER — Ambulatory Visit (INDEPENDENT_AMBULATORY_CARE_PROVIDER_SITE_OTHER): Payer: BC Managed Care – PPO | Admitting: Internal Medicine

## 2011-11-01 ENCOUNTER — Encounter: Payer: Self-pay | Admitting: Internal Medicine

## 2011-11-01 VITALS — BP 140/80 | HR 66 | Ht 63.0 in | Wt 144.0 lb

## 2011-11-01 DIAGNOSIS — I10 Essential (primary) hypertension: Secondary | ICD-10-CM

## 2011-11-01 DIAGNOSIS — G4700 Insomnia, unspecified: Secondary | ICD-10-CM

## 2011-11-01 DIAGNOSIS — R51 Headache: Secondary | ICD-10-CM

## 2011-11-01 DIAGNOSIS — Z79899 Other long term (current) drug therapy: Secondary | ICD-10-CM | POA: Insufficient documentation

## 2011-11-01 DIAGNOSIS — F411 Generalized anxiety disorder: Secondary | ICD-10-CM

## 2011-11-01 MED ORDER — ALPRAZOLAM 1 MG PO TABS: 1.0000 mg | ORAL_TABLET | Freq: Four times a day (QID) | ORAL | Status: DC | PRN

## 2011-11-01 MED ORDER — BUTALBITAL-APAP-CAFFEINE 50-325-40 MG PO TABS: 2.0000 | ORAL_TABLET | Freq: Two times a day (BID) | ORAL | Status: DC | PRN

## 2011-11-01 MED ORDER — ESCITALOPRAM OXALATE 10 MG PO TABS: 5.0000 mg | ORAL_TABLET | Freq: Every day | ORAL | Status: DC

## 2011-11-01 NOTE — Patient Instructions (Addendum)
Track headaches  And medication usage to minimize use and avoid rebound.  Begin low dose anxiety controller  And xanax .  eventual goal would be to see if anxiety is controlled on less xanax medication. Do not drive with the controlled medciations Consider seeing  Headache specialist .  Get fasting labs at next visit  In about 3-4 weeks.  You are due for this.  Get a mammogram.

## 2011-11-01 NOTE — Assessment & Plan Note (Signed)
Discussed use of this medicine which is a rescue medicine and is a controlled substance and has caffeine in it. It could also interfere with her sleep if she takes it in the evening. She can get rebound headaches with this but she does not think this is happening. At this time he will consider headache clinic but would like a refill of the medication. We've agreed to track her headaches and her medication use to see if she can avoid some triggers and in the meantime try to treat her anxiety disorder.  She will come back in about a month to review this problem also.

## 2011-11-01 NOTE — Assessment & Plan Note (Signed)
Dust use of Restoril as a dependent producing medication and can get rebound sleep problems. In addition to her Xanax and headache medication but try to reduce her pill count. Because she only uses this 2 or 3 times a week she will stop this at this point and optimize safe for treatment of the other problems as we discussed

## 2011-11-01 NOTE — Assessment & Plan Note (Signed)
Discussed at length that daily benzodiazepine and would not be a long-term solution for her anxiety as these her rescue medicines and treat the symptoms but not the underlying problem.  I do not feel that it is in her best interest nor this clinician's comfort level to continue round-the-clock benzodiazepine and without other interventions. She has great fear of not being able to continue her current regimen. However she is willing to try adding on low-dose controller medication extensive counseling given risk benefit. She will track her use of the Xanax not drive with that and we will follow her up in 3-4 weeks. She agrees to this plan.

## 2011-11-01 NOTE — Progress Notes (Signed)
Subjective:    Patient ID: Brooke Pennington, female    DOB: 1951-04-15, 61 y.o.   MRN: [id number]  HPI This is a new patient to my practice transferred from Dr. Scotty Court who is retired.  Her conditions include chronic tension headaches anxiety state and insomnia as well as hypertension.   Hx of headaches for years and usually from stress and using rx insTEAD  Of ot 2-3 advil. Has seem to have onset when stressed .   Med 2-3 x per week.  Not a rebound   Not caffiene .  Anxiety is a trigger  . No panic attacks.  On daily meds  Prn for xanax.  Takes  Usually evenings  Not a lot  A lot of driving.   Stress.  Takes a med and waits a while.    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 and lost marriage  Separated 03-08-2001 . and now remarried.   Hypertension on current regimen for a while seems to be tolerated is due for laboratory studies   HCM : Has had   ? Polyps neg colon  Screen utd on gyne  Last mammogram.  Was 6 years ago Unsure of her tetanus shot but would like to delay it today Review of Systems ROS:  GEN/ HEENT: No fever, significant weight changes sweats headaches vision problems hearing changes, CV/ PULM; No chest pain shortness of breath cough, syncope,edema  change in exercise tolerance. GI /GU: No adominal pain, vomiting, change in bowel habits. No blood in the stool. No significant GU symptoms. SKIN/HEME: ,no acute skin rashes suspicious lesions or bleeding. No lymphadenopathy, nodules, masses.  NEURO/ PSYCH:  No neurologic signs such as weakness numbness. No depression IMM/ Allergy: No unusual infections.  Allergy .   REST of 12 system review negative except as per HPI  Past Medical History  Diagnosis Date  . Eczema   . Headache   . Hypertension     History   Social History  . Marital Status: Married    Spouse Name: N/A    Number of Children: N/A  .  Years of Education: N/A   Occupational History  . Not on file.   Social History Main Topics  . Smoking status: Never Smoker   . Smokeless tobacco: Never Used  . Alcohol Use: 0.6 oz/week    1 Glasses of wine per week  . Drug Use: No  . Sexually Active: Yes   Other Topics Concern  . Not on file   Social History Narrative   Married h[employer] 2 College degreeG2P2    Past Surgical History  Procedure Date  . No past surgeries     Family History  Problem Relation Age of Onset  . Coronary artery disease Father   . Heart disease Father   . COPD Mother     No Known Allergies  Current Outpatient Prescriptions on File Prior to Visit  Medication Sig Dispense Refill  . aspirin 81 MG tablet Take 81 mg by mouth daily.        . calcium citrate-vitamin D 200-200 MG-UNIT TABS Take 1 tablet by mouth 2 (two) times daily.        Marland Kitchen lisinopril-hydrochlorothiazide (PRINZIDE,ZESTORETIC) 20-12.5 MG per tablet Take 1 tablet by mouth daily. For hypertension  30 tablet  11  . temazepam (RESTORIL) 30 MG capsule Take 1 capsule (30 mg  total) by mouth at bedtime as needed.  30 capsule  5  . escitalopram (LEXAPRO) 10 MG tablet Take 0.5 tablets (5 mg total) by mouth daily. Can increase to 1 po qd after 2 weeks  30 tablet  3    BP 140/80  Pulse 66  Ht 5\' 3"  (1.6 m)  Wt 144 lb (65.318 kg)  BMI 25.51 kg/m2       Objective:   Physical Exam WDWn in na d. She appears mildly anxious but has good eye contact she appears healthy HEENT: Normocephalic ;atraumatic , Eyes;  PERRL, EOMs  Full, lids and conjunctiva clear,,Ears: no deformities, canals nl, TM landmarks normal, Nose: no deformity or discharge  Mouth : OP clear without lesion or edema . Neck: Supple without adenopathy or masses or bruits Chest:  Clear to A&P without wheezes rales or rhonchi CV:  S1-S2 no gallops or murmurs peripheral perfusion is normal Abdomen:  Sof,t normal bowel sounds without hepatosplenomegaly, no guarding rebound or masses no  CVA tenderness No clubbing cyanosis or edema Oriented x 3 and no noted deficits in memory, attention, and speech. NEURO: oriented x 3 CN 3-12 appear intact. No focal muscle weakness or atrophy. DTRs symmetrical. Gait WNL.  Grossly non focal. No tremor or abnormal movement. Psych oriented no tremor or not depressed normal speech no tremor.  Lab Results  Component Value Date   WBC 6.1 07/27/2010   HGB 13.3 07/27/2010   HCT 38.2 07/27/2010   PLT 248.0 07/27/2010   GLUCOSE 94 07/27/2010   CHOL 267* 07/27/2010   TRIG 73.0 07/27/2010   HDL 109.50 07/27/2010   LDLDIRECT 128.9 07/27/2010   ALT 18 07/27/2010   AST 21 07/27/2010   NA 140 07/27/2010   K 4.9 07/27/2010   CL 101 07/27/2010   CREATININE 0.8 07/27/2010   BUN 19 07/27/2010   CO2 32 07/27/2010   TSH 1.16 07/27/2010        Assessment & Plan:  Chronic headaches   appear to be afternoon bitemporal tension headaches that she call them.  She's fallen into management of as needed medication of a rescue medicine that is controlled and contains caffeine. There no alarm symptoms with her headaches otherwise. The assessment and plan we'll try to reduce pill count and manage triggers. Unclear if this is affecting her sleep   Hypertension  controlled due for labs continue   Anxiety on around-the-clock Xanax for a number of years remote history of a medication and that was an antidepressant that "didn't work"  he is willing to add a controller medication and that we will try discussion about medicines good effects bad effects rebound anxiety et Karie Soda.  Sleep disturbance intermittent unclear if related to medications or underlying problem.   History of mildly elevated lipids we'll repeat a followup  Health care maintenance this at that time needs a mammogram we'll hold off on tdap    Total visit > 50% spent counseling and coordinating care     today as per patient doesn't want a flu shot

## 2011-11-01 NOTE — Assessment & Plan Note (Signed)
If her laboratory tests that got delayed because of Dr. Charmian Muff retirement. We'll plan lab tests fasting at her next visit

## 2011-11-07 ENCOUNTER — Telehealth: Payer: Self-pay | Admitting: *Deleted

## 2011-11-07 NOTE — Telephone Encounter (Signed)
Per Dr. Fabian Sharp try taking 1/4 tab and see if goes away for 1 week.

## 2011-11-07 NOTE — Telephone Encounter (Signed)
Left message to call back  

## 2011-11-07 NOTE — Telephone Encounter (Signed)
Lexapro 10 mg 1/2 daily is making pt nauseated x one week, and is asking if she should remain on it.

## 2011-11-16 NOTE — Telephone Encounter (Signed)
Pt aware of this. 

## 2011-11-28 ENCOUNTER — Encounter: Payer: Self-pay | Admitting: Internal Medicine

## 2011-11-28 ENCOUNTER — Ambulatory Visit (INDEPENDENT_AMBULATORY_CARE_PROVIDER_SITE_OTHER): Payer: BC Managed Care – PPO | Admitting: Internal Medicine

## 2011-11-28 VITALS — BP 100/60 | HR 72 | Temp 98.3°F | Ht 63.0 in | Wt 145.0 lb

## 2011-11-28 DIAGNOSIS — I10 Essential (primary) hypertension: Secondary | ICD-10-CM

## 2011-11-28 DIAGNOSIS — R51 Headache: Secondary | ICD-10-CM

## 2011-11-28 DIAGNOSIS — E559 Vitamin D deficiency, unspecified: Secondary | ICD-10-CM

## 2011-11-28 DIAGNOSIS — Z79899 Other long term (current) drug therapy: Secondary | ICD-10-CM

## 2011-11-28 DIAGNOSIS — F411 Generalized anxiety disorder: Secondary | ICD-10-CM

## 2011-11-28 DIAGNOSIS — G4700 Insomnia, unspecified: Secondary | ICD-10-CM

## 2011-11-28 DIAGNOSIS — E785 Hyperlipidemia, unspecified: Secondary | ICD-10-CM

## 2011-11-28 DIAGNOSIS — F4321 Adjustment disorder with depressed mood: Secondary | ICD-10-CM

## 2011-11-28 LAB — CBC WITH DIFFERENTIAL/PLATELET
Basophils Absolute: 0 10*3/uL (ref 0.0–0.1)
Eosinophils Absolute: 0 10*3/uL (ref 0.0–0.7)
Lymphocytes Relative: 21.1 % (ref 12.0–46.0)
MCHC: 34.2 g/dL (ref 30.0–36.0)
MCV: 100.3 fl — ABNORMAL HIGH (ref 78.0–100.0)
Monocytes Absolute: 0.3 10*3/uL (ref 0.1–1.0)
Neutro Abs: 3.7 10*3/uL (ref 1.4–7.7)
Neutrophils Relative %: 71.4 % (ref 43.0–77.0)
RDW: 12.6 % (ref 11.5–14.6)

## 2011-11-28 LAB — BASIC METABOLIC PANEL
CO2: 31 mEq/L (ref 19–32)
Calcium: 9.2 mg/dL (ref 8.4–10.5)
Chloride: 103 mEq/L (ref 96–112)
Creatinine, Ser: 0.8 mg/dL (ref 0.4–1.2)
Glucose, Bld: 84 mg/dL (ref 70–99)

## 2011-11-28 LAB — LIPID PANEL
Total CHOL/HDL Ratio: 3
Triglycerides: 58 mg/dL (ref 0.0–149.0)

## 2011-11-28 LAB — HEPATIC FUNCTION PANEL
Albumin: 4.1 g/dL (ref 3.5–5.2)
Alkaline Phosphatase: 78 U/L (ref 39–117)
Bilirubin, Direct: 0 mg/dL (ref 0.0–0.3)
Total Protein: 6.7 g/dL (ref 6.0–8.3)

## 2011-11-28 LAB — TSH: TSH: 1.27 u[IU]/mL (ref 0.35–5.50)

## 2011-11-28 MED ORDER — BUTALBITAL-APAP-CAFFEINE 50-325-40 MG PO TABS: 2.0000 | ORAL_TABLET | Freq: Two times a day (BID) | ORAL | Status: DC | PRN

## 2011-11-28 NOTE — Progress Notes (Signed)
  Subjective:    Patient ID: Rolanda Jay, female    DOB: 1951/01/03, 61 y.o.   MRN: [id number]  HPI Patient comes in today for follow up of  multiple medical problems.    BP  seems better today needs refill of meds . Was on ecotrin  And  To ask.  Headaches  :   Not that many stopped the esgic fo a few weeks cause ran out.  No rebound does caffiene  qd . Took  esgic 2 bid   Last one a week or so ago.   hasn't had many HAs  Just 2  Minor stress headache. Hasnt done HA calendar  Yet.  Asks for refill   When goes to bed  Mind races.    Dog died last fall and mom and had rough time   Giving time .     6 years.    Talks with sister.  Xanax about 4 x per day .  Or if cant sleep.   Lexapro had initial  nausea and had gi issues.poss a virus and now better did 1/4 tab  and just recently 10 mg per day.  Less than a week.  Seems ok now.   Review of Systems Neg cp sob falling vision change bleeding  Rest as per hpi Past history family history social history reviewed in the electronic medical record.     Objective:   Physical Exam  WDWN in nad    Mildly anxious good eye contact tearful when speaks of mom. appropriate conversation HEENT: Normocephalic ;atraumatic , Eyes;  PERRL, EOMs  Full, lids and conjunctiva clear,,Ears: no deformities, canals nl, TM landmarks normal, Nose: no deformity or discharge  Mouth : OP clear without lesion or edema . Neck: Supple without adenopathy or masses or bruits Chest:  Clear to A&P without wheezes rales or rhonchi CV:  S1-S2 no gallops or murmurs peripheral perfusion is normal Abdomen:  Sof,t normal bowel sounds without hepatosplenomegaly, no guarding rebound or masses no CVA tenderness No clubbing cyanosis or edema Neuro non focal no tremor       Assessment & Plan:  HT  Stable continue same med if labs ok   Headaches  Continue tracking   And use of meds  Use nsaid first and then fiorinal as rescue  Asks for 60 pills with refill  . Disc that this  Should  last quite a while if  Has controlled also caffeine can interfere with sleep.  Sleep  some anxiety other issues  Since she is taking reg benzos do not add the temazepam also .  Avoid her caffeine ( tea ) in pm .  Before 4-6 pm.  Anxiety   hasnt  Been on lexapro full dose for more than a week so will continue and assess in 2 months.   ? Vit d   Hx of osteopenia . Asks about other supplements not a lot of dairy.  Get level and then give recs   Check level today .  Grieving  Mom of 6 years recent pet.  Counseled.  Supports etc.   Hyperlipidemia  lsi    Check today   Asks about ASA   Risk benefit uncertain in her situation . Would temporarily hold while using a nsaid prn for HAs .   rsik of bleeding but eventually ok to uses low dose   If wishes .  At 67 may have more benefit than risk .

## 2011-11-28 NOTE — Patient Instructions (Addendum)
Will notify you  of labs when available.  Limit esgic medicine for headache as we discussed and it does have  caffiene in it . And this could affect sleep. Calendar  Headaches and what you take for the headaches and be aware of caffiene.  Try aleve as needed for basic headache .   1-2 aleve      Max twice a day  OR  Ibuprofen 600- 800 mg every 8 hours if needed.  Will notify you  of labs when available. Then make plan .

## 2011-11-29 ENCOUNTER — Encounter: Payer: Self-pay | Admitting: Internal Medicine

## 2011-11-29 ENCOUNTER — Encounter: Payer: BC Managed Care – PPO | Admitting: Family Medicine

## 2011-11-29 DIAGNOSIS — D7589 Other specified diseases of blood and blood-forming organs: Secondary | ICD-10-CM

## 2011-11-29 DIAGNOSIS — F4321 Adjustment disorder with depressed mood: Secondary | ICD-10-CM | POA: Insufficient documentation

## 2011-11-29 HISTORY — DX: Other specified diseases of blood and blood-forming organs: D75.89

## 2011-11-29 LAB — VITAMIN D 25 HYDROXY (VIT D DEFICIENCY, FRACTURES): Vit D, 25-Hydroxy: 17 ng/mL — ABNORMAL LOW (ref 30–89)

## 2011-11-29 MED ORDER — LISINOPRIL-HYDROCHLOROTHIAZIDE 20-12.5 MG PO TABS: 1.0000 | ORAL_TABLET | Freq: Every day | ORAL | Status: DC

## 2011-12-01 NOTE — Progress Notes (Signed)
Quick Note:  Left message to call back. ______ 

## 2011-12-02 ENCOUNTER — Telehealth: Payer: Self-pay | Admitting: *Deleted

## 2011-12-02 ENCOUNTER — Other Ambulatory Visit: Payer: Self-pay | Admitting: Internal Medicine

## 2011-12-02 DIAGNOSIS — D649 Anemia, unspecified: Secondary | ICD-10-CM

## 2011-12-02 DIAGNOSIS — E559 Vitamin D deficiency, unspecified: Secondary | ICD-10-CM

## 2011-12-02 DIAGNOSIS — D7589 Other specified diseases of blood and blood-forming organs: Secondary | ICD-10-CM

## 2011-12-02 NOTE — Telephone Encounter (Signed)
Pt is wanting to know when she should have her lipids rechecked since they were elevated?

## 2011-12-06 NOTE — Telephone Encounter (Signed)
i would have her  Intensify healthy eating and exercise and check this in 6 months or so .  We can coordinate this at her next OV .

## 2011-12-07 NOTE — Telephone Encounter (Signed)
Left message to call back  

## 2011-12-07 NOTE — Telephone Encounter (Signed)
Pt aware of this. 

## 2011-12-19 ENCOUNTER — Telehealth: Payer: Self-pay

## 2011-12-19 NOTE — Telephone Encounter (Signed)
Need more info ;is she asking for more ? How many ?

## 2011-12-19 NOTE — Telephone Encounter (Signed)
VM:  Pt states she has a question about her medication.  Pt was standing at her sink and opened her butalbital and the medication fell in the sink into some water.  Pt states she has been taking the medication as Dr. Fabian Sharp has recommended.  Pls advise.

## 2011-12-20 NOTE — Telephone Encounter (Signed)
Called pt to get more information.

## 2011-12-27 ENCOUNTER — Telehealth: Payer: Self-pay | Admitting: *Deleted

## 2011-12-27 DIAGNOSIS — Z79899 Other long term (current) drug therapy: Secondary | ICD-10-CM

## 2011-12-27 DIAGNOSIS — E785 Hyperlipidemia, unspecified: Secondary | ICD-10-CM

## 2011-12-27 DIAGNOSIS — E559 Vitamin D deficiency, unspecified: Secondary | ICD-10-CM

## 2011-12-27 DIAGNOSIS — R51 Headache: Secondary | ICD-10-CM

## 2011-12-27 DIAGNOSIS — I10 Essential (primary) hypertension: Secondary | ICD-10-CM

## 2011-12-27 DIAGNOSIS — F4321 Adjustment disorder with depressed mood: Secondary | ICD-10-CM

## 2011-12-27 DIAGNOSIS — G4700 Insomnia, unspecified: Secondary | ICD-10-CM

## 2011-12-27 DIAGNOSIS — F411 Generalized anxiety disorder: Secondary | ICD-10-CM

## 2011-12-27 NOTE — Telephone Encounter (Signed)
Left a message for pt to return call 

## 2011-12-27 NOTE — Telephone Encounter (Signed)
Pt is asking for a refill of Fioricet.  States she dropped her whole bottle down the sink.

## 2011-12-27 NOTE — Telephone Encounter (Signed)
Ok to refill x 1  early

## 2011-12-28 MED ORDER — BUTALBITAL-APAP-CAFFEINE 50-325-40 MG PO TABS: ORAL_TABLET | ORAL | Status: DC

## 2011-12-28 NOTE — Telephone Encounter (Signed)
Rx called into pharmacy. Pt aware.  

## 2012-01-09 ENCOUNTER — Other Ambulatory Visit: Payer: Self-pay

## 2012-01-09 NOTE — Telephone Encounter (Signed)
Pt called requesting alprazolam 1 mg tablet- take 1 tablet qid.  Pt last seen 11/28/2011.  Rx last filled 11/01/11 #90 x 1 rf.  Pls advise.

## 2012-01-10 NOTE — Telephone Encounter (Signed)
Can refill x 1  Disp 90

## 2012-01-11 MED ORDER — ALPRAZOLAM 1 MG PO TABS: 1.0000 mg | ORAL_TABLET | Freq: Four times a day (QID) | ORAL | Status: DC | PRN

## 2012-01-11 NOTE — Telephone Encounter (Signed)
Rx called in to pharmacy. 

## 2012-01-16 ENCOUNTER — Other Ambulatory Visit: Payer: Self-pay

## 2012-01-16 DIAGNOSIS — E785 Hyperlipidemia, unspecified: Secondary | ICD-10-CM

## 2012-01-16 DIAGNOSIS — Z79899 Other long term (current) drug therapy: Secondary | ICD-10-CM

## 2012-01-16 DIAGNOSIS — F4321 Adjustment disorder with depressed mood: Secondary | ICD-10-CM

## 2012-01-16 DIAGNOSIS — I10 Essential (primary) hypertension: Secondary | ICD-10-CM

## 2012-01-16 DIAGNOSIS — R51 Headache: Secondary | ICD-10-CM

## 2012-01-16 DIAGNOSIS — E559 Vitamin D deficiency, unspecified: Secondary | ICD-10-CM

## 2012-01-16 DIAGNOSIS — F411 Generalized anxiety disorder: Secondary | ICD-10-CM

## 2012-01-16 DIAGNOSIS — G4700 Insomnia, unspecified: Secondary | ICD-10-CM

## 2012-01-16 NOTE — Telephone Encounter (Signed)
Pt called stating she is coming in next week and needs her butalbital-acetaminophen- caffeine called in to the pharmacy. Pt states on voicemail that she has been taking the medication as Dr. Fabian Sharp prescribed.  Rx last filled 12/28/11 #60.  Pt has lab appt on 4/26 and f/u ov on 5/3. Pls advise.

## 2012-01-16 NOTE — Telephone Encounter (Signed)
Needs to try lower dosing perhaps one at a time  To wean down for her headaches .   In the meantime can refill disp#   60 no  Refill .

## 2012-01-17 MED ORDER — BUTALBITAL-APAP-CAFFEINE 50-325-40 MG PO TABS: ORAL_TABLET | ORAL | Status: DC

## 2012-01-17 NOTE — Telephone Encounter (Signed)
LMTCB

## 2012-01-17 NOTE — Telephone Encounter (Signed)
Spoke with patient and she is willing to try and ween of the medication.  Rx called in

## 2012-01-20 ENCOUNTER — Other Ambulatory Visit (INDEPENDENT_AMBULATORY_CARE_PROVIDER_SITE_OTHER): Payer: BC Managed Care – PPO

## 2012-01-20 DIAGNOSIS — D649 Anemia, unspecified: Secondary | ICD-10-CM

## 2012-01-20 DIAGNOSIS — E559 Vitamin D deficiency, unspecified: Secondary | ICD-10-CM

## 2012-01-20 DIAGNOSIS — D7589 Other specified diseases of blood and blood-forming organs: Secondary | ICD-10-CM

## 2012-01-20 LAB — IBC PANEL
Saturation Ratios: 30.3 % (ref 20.0–50.0)
Transferrin: 254.5 mg/dL (ref 212.0–360.0)

## 2012-01-20 LAB — CBC WITH DIFFERENTIAL/PLATELET
Eosinophils Relative: 0.1 % (ref 0.0–5.0)
HCT: 35.9 % — ABNORMAL LOW (ref 36.0–46.0)
Hemoglobin: 12.2 g/dL (ref 12.0–15.0)
Lymphs Abs: 1.2 10*3/uL (ref 0.7–4.0)
Monocytes Relative: 4.6 % (ref 3.0–12.0)
Neutro Abs: 5.2 10*3/uL (ref 1.4–7.7)
Platelets: 256 10*3/uL (ref 150.0–400.0)
WBC: 6.7 10*3/uL (ref 4.5–10.5)

## 2012-01-20 LAB — VITAMIN B12: Vitamin B-12: 651 pg/mL (ref 211–911)

## 2012-01-23 ENCOUNTER — Telehealth: Payer: Self-pay

## 2012-01-23 NOTE — Telephone Encounter (Signed)
Pt states for the last week she has been hearing gurgling noises in her stomach.  Pt states at times it feels as though she needs to have a bowel movement and sometimes she does and sometimes she does not. Pt states when she does not and she wipes she gets a little stool on the tissue.  Pt denies any blood in BM.  Pt states today and yesterday she had lower abdominal pain that feels uncomfortable and a like a "fullness in her vagina".  Pt states she has had a bowel movement 3 times today.  Pt states this is keeping her up at night and last night was the first time she slept all night.  Pt states the pain in on the left side below her navel.  Pt would like to know if Dr. Fabian Sharp recommends she goes to her gynecologist.  Pt does not have a GI specialist.  Pls advise.

## 2012-01-23 NOTE — Telephone Encounter (Signed)
Pt states she has been sick with allergies and would like to know if this could be causing this to happen?

## 2012-01-23 NOTE — Telephone Encounter (Signed)
Doubt its from allergy . She should see her gyne to rule out gyne issues but it sounds like a GI problem .  Change in bowel habits .  If she has no fever or vomiting  We can wait and discuss this at her OV

## 2012-01-24 NOTE — Telephone Encounter (Signed)
Left a message for pt to return call 

## 2012-01-25 NOTE — Telephone Encounter (Signed)
Pt is aware and states it seems like everything has cleared itself up.  Pt states she will discuss this at there ov on 01/27/12.

## 2012-01-25 NOTE — Telephone Encounter (Signed)
Left a message for pt to return call 

## 2012-01-27 ENCOUNTER — Telehealth: Payer: Self-pay | Admitting: Internal Medicine

## 2012-01-27 ENCOUNTER — Encounter: Payer: Self-pay | Admitting: Internal Medicine

## 2012-01-27 ENCOUNTER — Ambulatory Visit (INDEPENDENT_AMBULATORY_CARE_PROVIDER_SITE_OTHER): Payer: BC Managed Care – PPO | Admitting: Internal Medicine

## 2012-01-27 VITALS — BP 116/74 | HR 88 | Temp 98.0°F | Wt 139.0 lb

## 2012-01-27 DIAGNOSIS — I10 Essential (primary) hypertension: Secondary | ICD-10-CM

## 2012-01-27 DIAGNOSIS — Z79899 Other long term (current) drug therapy: Secondary | ICD-10-CM

## 2012-01-27 DIAGNOSIS — D649 Anemia, unspecified: Secondary | ICD-10-CM

## 2012-01-27 DIAGNOSIS — F411 Generalized anxiety disorder: Secondary | ICD-10-CM

## 2012-01-27 DIAGNOSIS — E785 Hyperlipidemia, unspecified: Secondary | ICD-10-CM

## 2012-01-27 DIAGNOSIS — E559 Vitamin D deficiency, unspecified: Secondary | ICD-10-CM

## 2012-01-27 DIAGNOSIS — R51 Headache: Secondary | ICD-10-CM

## 2012-01-27 DIAGNOSIS — F4321 Adjustment disorder with depressed mood: Secondary | ICD-10-CM

## 2012-01-27 DIAGNOSIS — G4700 Insomnia, unspecified: Secondary | ICD-10-CM

## 2012-01-27 MED ORDER — BUTALBITAL-APAP-CAFFEINE 50-325-40 MG PO TABS: ORAL_TABLET | ORAL | Status: DC

## 2012-01-27 MED ORDER — ALPRAZOLAM 1 MG PO TABS: 1.0000 mg | ORAL_TABLET | Freq: Four times a day (QID) | ORAL | Status: DC | PRN

## 2012-01-27 MED ORDER — ESCITALOPRAM OXALATE 10 MG PO TABS: 15.0000 mg | ORAL_TABLET | Freq: Every day | ORAL | Status: DC

## 2012-01-27 NOTE — Telephone Encounter (Signed)
Spoke with pt and pt is aware that medication will not be refilled until Tuesday.  Pt states she only has 3 to 4 tablets left.  Pt states she is not 100% sure and will call back on Monday to give the final number of medication.

## 2012-01-27 NOTE — Progress Notes (Signed)
  Subjective:    Patient ID: Brooke Pennington, female    DOB: Feb 23, 1951, 61 y.o.   MRN: [id number]  HPI Patient comes in today for followup of multiple medical issues and is medication management. At the onset of the visit the electronic record was not available. Since her last visit she has been able to get tolerates Lexapro 10 mg unsure if it helps her she's been taking the Xanax pretty much 4 times a day and the Fioricet pretty regularly however she states that when she stops it she doesn't get her rebound headache. Takes it to once or twice a day midday and evening and it makes her feel better but she can't explain why. She is taking 2000 units of vitamin D a day without side effects.   Review of Systems Negative for chest pain shortness of breath syncope as above no neurologic signs.    Objective:   Physical Exam BP 116/74  Pulse 88  Temp(Src) 98 F (36.7 C) (Oral)  Wt 139 lb (63.05 kg)  SpO2 98% Well-developed well-nourished in no acute distress mildly anxious normal speech good eye contact.  Laboratory studies were reviewed     Assessment & Plan:   Anxiety mood; Lexapro may be helping some still feels like she needs to take Xanax 4 times a day to treat anxiety will increase to 15 mg slowly consider switching to Klonopin  Headaches taking Fioricet but on further questioning she is really taking it to feel better like to twice a day sometimes. She can't explain it but says that it makes her feel better. She is avoiding caffeine in her diet otherwise. Would have her track how she takes medication consider switching to the caffeine free version of esgic  realizing that we may be treating anxiety and not headaches. Review other caffiene and etoh social at recheck.   She is taking the vitamin D 3 and her blood levels are better. To continue Ho on calcium  a nd vit d   Borderline anemia iron level is low normal was probably lower previously. For now stay off aspirin. Her macrocytosis  is better.  Hypertension stable no change   We'll followup in 2 months or as needed have given her 100 of the Xanax with the refill 160 with 2 refills of the Esgic plus. Or Fioricet.  Total visit > 50% spent counseling and coordinating care

## 2012-01-27 NOTE — Telephone Encounter (Signed)
Called and spoke with Morrie Sheldon at the Pharmacy and she states pt received alprazolam on 01/11/12 and butalbital on 01/17/12.  Per Dr. Fabian Sharp pt can have a refill of her medications on 01/31/12.  Morrie Sheldon is aware at the pharmacy.    Left a message for pt to return call.

## 2012-01-27 NOTE — Telephone Encounter (Signed)
Pls advise.  

## 2012-01-27 NOTE — Patient Instructions (Addendum)
Is to increase the Lexapro to 15 mg or 1-1/2 tablets a day. This may help anxiety and mood.  I want you to track how many and when you are taking the Fioricet and the Xanax. Please write down time of day and how much you were taking. Please notice how it helps you.  Do this until you come back as best you can.  I am suspecting that the Fioricet is helping more your anxiety again your headaches. We may consider changing this to minimize the caffeine which could make her anxiety worse.  At this time we'll not change it however. I still would have your goal as decreasing her pill count for future.  There are other medications in the Xanax categories such as Klonopin which lasts longer and can be used for anxiety and sleep. They are more likely to have less rebound anxiety.   Continue the vitamin D 3 2000 units per day you can add one calcium pill if you do not think you're getting enough calcium in your diet 500-600 mg a day.   For now we are going to hold off on the aspirin and will probably add it back later.

## 2012-01-27 NOTE — Telephone Encounter (Signed)
Ok

## 2012-01-27 NOTE — Telephone Encounter (Signed)
Pharmacist called and said that pt is req early refill on butalbital-acetaminophen-caffeine (FIORICET) 50-325-40 MG per tablet and  ALPRAZolam (XANAX) 1 MG tablet.

## 2012-03-14 ENCOUNTER — Other Ambulatory Visit: Payer: Self-pay | Admitting: Family Medicine

## 2012-03-14 NOTE — Telephone Encounter (Signed)
Ok to refill x 1 disp 100 no refills

## 2012-03-14 NOTE — Telephone Encounter (Signed)
This pt was last seen on 01/27/12 and has a f/u on 03/28/12.  Please advise.

## 2012-03-16 MED ORDER — ALPRAZOLAM 1 MG PO TABS: 1.0000 mg | ORAL_TABLET | Freq: Four times a day (QID) | ORAL | Status: DC | PRN

## 2012-03-16 NOTE — Telephone Encounter (Signed)
Rx called in 

## 2012-03-28 ENCOUNTER — Ambulatory Visit: Payer: BC Managed Care – PPO | Admitting: Internal Medicine

## 2012-04-03 ENCOUNTER — Ambulatory Visit: Payer: BC Managed Care – PPO | Admitting: Internal Medicine

## 2012-04-09 ENCOUNTER — Encounter: Payer: Self-pay | Admitting: Internal Medicine

## 2012-04-09 ENCOUNTER — Ambulatory Visit (INDEPENDENT_AMBULATORY_CARE_PROVIDER_SITE_OTHER): Payer: BC Managed Care – PPO | Admitting: Internal Medicine

## 2012-04-09 VITALS — BP 120/70 | HR 90 | Temp 98.1°F | Wt 143.0 lb

## 2012-04-09 DIAGNOSIS — R51 Headache: Secondary | ICD-10-CM

## 2012-04-09 DIAGNOSIS — Z79899 Other long term (current) drug therapy: Secondary | ICD-10-CM

## 2012-04-09 DIAGNOSIS — E785 Hyperlipidemia, unspecified: Secondary | ICD-10-CM

## 2012-04-09 DIAGNOSIS — F411 Generalized anxiety disorder: Secondary | ICD-10-CM

## 2012-04-09 DIAGNOSIS — E559 Vitamin D deficiency, unspecified: Secondary | ICD-10-CM

## 2012-04-09 DIAGNOSIS — I10 Essential (primary) hypertension: Secondary | ICD-10-CM

## 2012-04-09 DIAGNOSIS — D649 Anemia, unspecified: Secondary | ICD-10-CM

## 2012-04-09 DIAGNOSIS — F4321 Adjustment disorder with depressed mood: Secondary | ICD-10-CM

## 2012-04-09 DIAGNOSIS — G4700 Insomnia, unspecified: Secondary | ICD-10-CM

## 2012-04-09 LAB — POCT HEMOGLOBIN: Hemoglobin: 11.9 g/dL — AB (ref 12.2–16.2)

## 2012-04-09 MED ORDER — ESCITALOPRAM OXALATE 10 MG PO TABS: 20.0000 mg | ORAL_TABLET | Freq: Every day | ORAL | Status: DC

## 2012-04-09 MED ORDER — BUTALBITAL-APAP-CAFFEINE 50-325-40 MG PO TABS: ORAL_TABLET | ORAL | Status: DC

## 2012-04-09 MED ORDER — ALPRAZOLAM 1 MG PO TABS: 1.0000 mg | ORAL_TABLET | Freq: Four times a day (QID) | ORAL | Status: DC | PRN

## 2012-04-09 NOTE — Patient Instructions (Addendum)
Increase  lexapro to 20 mg  Per day ( 2 per day)  And see how you are doing after 3 weeks  If doing ok then remain on 20 mg for this.    81 mg or aspirin  Per day or 3 x per week for risk reduction .  But your hg is borderline so dont start yet.   Continue to limit meds if possible  Can try 1/2 of xanax dose occassionally

## 2012-04-09 NOTE — Progress Notes (Signed)
  Subjective:    Patient ID: Brooke Pennington, female    DOB: 31-May-1951, 61 y.o.   MRN: [id number]  HPI  Patient comes in today for follow up of  multiple medical problems.  On high risk meds for years and discussing optimization of use. HAs  :one or 2 fioricet  per day.   Not rebound . HAs  Makes her feel better .  After she take these.  Taking lexapro 15 mg per day.  Using bathroom after this.   Has better   tension type ild  has .    1-2 per day. A bit more energetic.  Xanax : For nervous.     And feels needs this.  As taking   On it for years denies amnesia  Trying to take less Holding on to the asa for now.   No bleeding  .  BP seems to be controlled without se.  Review of Systems No fever syncope cp sob new sx  Past history family history social history reviewed in the electronic medical record.     Objective:   Physical Exam BP 120/70  Pulse 90  Temp 98.1 F (36.7 C) (Oral)  Wt 143 lb (64.864 kg)  SpO2 99% WDWN in nad Good eye contact mild anxiety  Oriented x 3 and no noted deficits in memory, attention, and speech. Co rr no g or m  Reviewed med taking  Neuro grossly non focal   Lab Results  Component Value Date   HGB 11.9* 04/09/2012       Assessment & Plan:  Chronic  anxiety      Requiring  Chronic  benzos  And controller meds.   No obs se otherwise . Risk benefit of medication discussed.  Increase lexapro to 20 mg trial in the meantime and see if 1/2 dose ocass will do just as well.  Pt aware. Refill 100  In 30 days  HAs minor  i still think the med help her anxiety  More than headaches  Refilling 120  X 2  Has tension   Using 2 x per day.  Consideration of med without caffiene such as esgic  . Continued care with the caffiene . Doesn't add much to her diet.  ASA continue to hold the asa  And fu   Total visit > 50% spent counseling and coordinating care

## 2012-04-15 ENCOUNTER — Encounter: Payer: Self-pay | Admitting: Internal Medicine

## 2012-04-15 NOTE — Assessment & Plan Note (Signed)
Better at last check

## 2012-05-08 ENCOUNTER — Telehealth: Payer: Self-pay | Admitting: Family Medicine

## 2012-05-08 NOTE — Telephone Encounter (Signed)
Patient called and left a message on my voicemail, 05/07/12.  Message was unclear.  I think she needs a medication refill.  Tried calling the patient back.  Left a message on machine.  Pt can be reached at [phone].

## 2012-05-09 ENCOUNTER — Other Ambulatory Visit: Payer: Self-pay | Admitting: Family Medicine

## 2012-05-09 NOTE — Telephone Encounter (Signed)
Patient called and informed me that she had accidentally spilled her Fioricet into the sink with water in it.  Please advise if I can give additional refill.  Thanks!!!

## 2012-05-09 NOTE — Telephone Encounter (Signed)
NO early refills unless Dr. Fabian Sharp wants to next week

## 2012-05-10 NOTE — Telephone Encounter (Signed)
Left message on machine for the pt to return my call. 

## 2012-05-11 NOTE — Telephone Encounter (Signed)
Left message on voicemail for the pt to return my call. 

## 2012-05-11 NOTE — Telephone Encounter (Signed)
Notified pt.  Will send to [facility] for further instructions.

## 2012-05-13 NOTE — Telephone Encounter (Signed)
Rx disp 40  #  This time .

## 2012-05-14 ENCOUNTER — Other Ambulatory Visit: Payer: Self-pay | Admitting: Family Medicine

## 2012-05-14 DIAGNOSIS — R51 Headache: Secondary | ICD-10-CM

## 2012-05-14 DIAGNOSIS — F411 Generalized anxiety disorder: Secondary | ICD-10-CM

## 2012-05-14 DIAGNOSIS — F4321 Adjustment disorder with depressed mood: Secondary | ICD-10-CM

## 2012-05-14 DIAGNOSIS — Z79899 Other long term (current) drug therapy: Secondary | ICD-10-CM

## 2012-05-14 DIAGNOSIS — E785 Hyperlipidemia, unspecified: Secondary | ICD-10-CM

## 2012-05-14 DIAGNOSIS — G4700 Insomnia, unspecified: Secondary | ICD-10-CM

## 2012-05-14 DIAGNOSIS — E559 Vitamin D deficiency, unspecified: Secondary | ICD-10-CM

## 2012-05-14 DIAGNOSIS — I10 Essential (primary) hypertension: Secondary | ICD-10-CM

## 2012-05-14 MED ORDER — BUTALBITAL-APAP-CAFFEINE 50-325-40 MG PO TABS: ORAL_TABLET | ORAL | Status: DC

## 2012-05-14 NOTE — Telephone Encounter (Signed)
Pt notified by telephone. 

## 2012-05-14 NOTE — Telephone Encounter (Signed)
Left message for pt to return my call.

## 2012-06-11 ENCOUNTER — Telehealth: Payer: Self-pay | Admitting: Family Medicine

## 2012-06-11 NOTE — Telephone Encounter (Signed)
Pt requesting refills.  Last seen 04/09/12 and filled on this day.  Has fu on 07/10/12.  Please advise.

## 2012-06-13 ENCOUNTER — Telehealth: Payer: Self-pay | Admitting: Family Medicine

## 2012-06-13 NOTE — Telephone Encounter (Signed)
[facility] to ask [place] for specific directions for this medications.  Seems the pt has been taking 5-6 daily.  Please supply more specific sig.  Thanks!!  Contact:  Steward Drone or F[place]on Mole [phone]

## 2012-06-14 ENCOUNTER — Telehealth: Payer: Self-pay | Admitting: Internal Medicine

## 2012-06-14 ENCOUNTER — Other Ambulatory Visit: Payer: Self-pay | Admitting: Family Medicine

## 2012-06-14 DIAGNOSIS — F411 Generalized anxiety disorder: Secondary | ICD-10-CM

## 2012-06-14 DIAGNOSIS — E785 Hyperlipidemia, unspecified: Secondary | ICD-10-CM

## 2012-06-14 DIAGNOSIS — I10 Essential (primary) hypertension: Secondary | ICD-10-CM

## 2012-06-14 DIAGNOSIS — Z79899 Other long term (current) drug therapy: Secondary | ICD-10-CM

## 2012-06-14 DIAGNOSIS — G4700 Insomnia, unspecified: Secondary | ICD-10-CM

## 2012-06-14 DIAGNOSIS — R51 Headache: Secondary | ICD-10-CM

## 2012-06-14 DIAGNOSIS — F4321 Adjustment disorder with depressed mood: Secondary | ICD-10-CM

## 2012-06-14 DIAGNOSIS — E559 Vitamin D deficiency, unspecified: Secondary | ICD-10-CM

## 2012-06-14 MED ORDER — ALPRAZOLAM 1 MG PO TABS: 1.0000 mg | ORAL_TABLET | Freq: Four times a day (QID) | ORAL | Status: DC | PRN

## 2012-06-14 MED ORDER — BUTALBITAL-APAP-CAFFEINE 50-325-40 MG PO TABS: ORAL_TABLET | ORAL | Status: DC

## 2012-06-14 NOTE — Telephone Encounter (Signed)
limit  Maximum per day  4   Of fioricet . Disp : 60  No refill

## 2012-06-14 NOTE — Telephone Encounter (Signed)
Refill x1 

## 2012-06-14 NOTE — Telephone Encounter (Signed)
Called pharmacy and spoke with Kathee Polite pt's rx called in.  Called and spoke with pt and pt is aware rx sent in.

## 2012-06-14 NOTE — Telephone Encounter (Signed)
Sent to the pharmacy and noted.

## 2012-06-14 NOTE — Telephone Encounter (Signed)
Pt called and said that [facility] [place] and they have not rcvd the script for ALPRAZolam Brooke Pennington) 1 MG tablet . Pls call in directly to the pharmacy asap today. Pls notify pt when this has been done. [facility] [place] [address].

## 2012-06-14 NOTE — Telephone Encounter (Signed)
Faxed to the pharmacy.

## 2012-07-04 ENCOUNTER — Telehealth: Payer: Self-pay | Admitting: Family Medicine

## 2012-07-04 NOTE — Telephone Encounter (Signed)
Pt is requesting a refill.  Last seen on 04/09/12 and has a fu on 07/10/12.  Last filled on 06/11/12.  Please advise.  Thanks!!!

## 2012-07-06 ENCOUNTER — Other Ambulatory Visit: Payer: Self-pay | Admitting: Internal Medicine

## 2012-07-06 MED ORDER — ALPRAZOLAM 1 MG PO TABS: 1.0000 mg | ORAL_TABLET | Freq: Four times a day (QID) | ORAL | Status: DC | PRN

## 2012-07-06 NOTE — Telephone Encounter (Signed)
Called to the pharmacy and left on voicemail. 

## 2012-07-06 NOTE — Telephone Encounter (Signed)
Ok x 1

## 2012-07-09 ENCOUNTER — Other Ambulatory Visit: Payer: Self-pay | Admitting: Family Medicine

## 2012-07-09 DIAGNOSIS — Z79899 Other long term (current) drug therapy: Secondary | ICD-10-CM

## 2012-07-09 DIAGNOSIS — I10 Essential (primary) hypertension: Secondary | ICD-10-CM

## 2012-07-09 DIAGNOSIS — F4321 Adjustment disorder with depressed mood: Secondary | ICD-10-CM

## 2012-07-09 DIAGNOSIS — G4700 Insomnia, unspecified: Secondary | ICD-10-CM

## 2012-07-09 DIAGNOSIS — R51 Headache: Secondary | ICD-10-CM

## 2012-07-09 DIAGNOSIS — F411 Generalized anxiety disorder: Secondary | ICD-10-CM

## 2012-07-09 DIAGNOSIS — E785 Hyperlipidemia, unspecified: Secondary | ICD-10-CM

## 2012-07-09 DIAGNOSIS — E559 Vitamin D deficiency, unspecified: Secondary | ICD-10-CM

## 2012-07-09 NOTE — Telephone Encounter (Signed)
Pt is requesting refills.  She has a fu scheduled on 07/17/12.  Last seen 04/09/12.  Last filled on 06/14/12 #60 with 0 additional refills.  Pt stated she is only taking 4 per day.  Please advise.  Thanks!!

## 2012-07-10 ENCOUNTER — Ambulatory Visit: Payer: BC Managed Care – PPO | Admitting: Internal Medicine

## 2012-07-11 ENCOUNTER — Other Ambulatory Visit: Payer: Self-pay | Admitting: Family Medicine

## 2012-07-11 MED ORDER — BUTALBITAL-APAP-CAFFEINE 50-325-40 MG PO TABS: ORAL_TABLET | ORAL | Status: DC

## 2012-07-11 NOTE — Telephone Encounter (Signed)
Need to wean this medication   Refill x 1  At present

## 2012-07-17 ENCOUNTER — Ambulatory Visit: Payer: BC Managed Care – PPO | Admitting: Internal Medicine

## 2012-07-24 ENCOUNTER — Other Ambulatory Visit: Payer: Self-pay | Admitting: Family Medicine

## 2012-07-24 ENCOUNTER — Encounter: Payer: Self-pay | Admitting: Internal Medicine

## 2012-07-24 ENCOUNTER — Ambulatory Visit (INDEPENDENT_AMBULATORY_CARE_PROVIDER_SITE_OTHER): Payer: BC Managed Care – PPO | Admitting: Internal Medicine

## 2012-07-24 VITALS — BP 134/76 | HR 80 | Temp 98.2°F | Wt 144.0 lb

## 2012-07-24 DIAGNOSIS — I10 Essential (primary) hypertension: Secondary | ICD-10-CM

## 2012-07-24 DIAGNOSIS — E559 Vitamin D deficiency, unspecified: Secondary | ICD-10-CM

## 2012-07-24 DIAGNOSIS — F411 Generalized anxiety disorder: Secondary | ICD-10-CM

## 2012-07-24 DIAGNOSIS — Z79899 Other long term (current) drug therapy: Secondary | ICD-10-CM

## 2012-07-24 DIAGNOSIS — R51 Headache: Secondary | ICD-10-CM

## 2012-07-24 DIAGNOSIS — G4700 Insomnia, unspecified: Secondary | ICD-10-CM

## 2012-07-24 DIAGNOSIS — D649 Anemia, unspecified: Secondary | ICD-10-CM

## 2012-07-24 DIAGNOSIS — E785 Hyperlipidemia, unspecified: Secondary | ICD-10-CM

## 2012-07-24 DIAGNOSIS — F4321 Adjustment disorder with depressed mood: Secondary | ICD-10-CM

## 2012-07-24 LAB — POCT HEMOGLOBIN: Hemoglobin: 11.9 g/dL — AB (ref 12.2–16.2)

## 2012-07-24 MED ORDER — BUTALBITAL-APAP-CAFFEINE 50-325-40 MG PO TABS: ORAL_TABLET | ORAL | Status: DC

## 2012-07-24 MED ORDER — ALPRAZOLAM 1 MG PO TABS: 1.0000 mg | ORAL_TABLET | Freq: Four times a day (QID) | ORAL | Status: DC | PRN

## 2012-07-24 MED ORDER — ALPRAZOLAM 1 MG PO TABS: ORAL_TABLET | ORAL | Status: DC

## 2012-07-24 MED ORDER — ESCITALOPRAM OXALATE 10 MG PO TABS: 20.0000 mg | ORAL_TABLET | Freq: Every day | ORAL | Status: DC

## 2012-07-24 NOTE — Progress Notes (Signed)
Chief Complaint  Patient presents with  . Follow-up    anxiety meds.  . Hypertension    HPI: Patient comes in today for follow up of  multiple medical problems.  Mostly med management .  Anxiety ;Aprazolam qid    Tried to take 1/2  And didn't feel that good.  Taking  Max 4 per day of fioricet and agrees l  Not acutaully for ha at this point.   Makes her feel better and worse if not taking  Down to 10 of lexapro  Took 20 mg for about 4-5 days and no difference so didn't stay at higher dose.  Ht doing ok on meds  Sleep better some  Usually ok .  No bleeding  Consider as a again.   ROS: See pertinent positives and negatives per HPI. No new cp sob syncope depression  Past Medical History  Diagnosis Date  . Eczema   . Headache   . Hypertension     Family History  Problem Relation Age of Onset  . Coronary artery disease Father   . Heart disease Father   . COPD Mother     History   Social History  . Marital Status: Married    Spouse Name: N/A    Number of Children: N/A  . Years of Education: N/A   Social History Main Topics  . Smoking status: Never Smoker   . Smokeless tobacco: Never Used  . Alcohol Use: 0.6 oz/week    1 Glasses of wine per week  . Drug Use: No  . Sexually Active: Yes   Other Topics Concern  . None   Social History Narrative   Married remarried   he is self-employedhhof 2 Automotive engineer degree worked at [employer]. [place] previouslyG2P2No falls .  Has smoke detector and wears seat belts.  No firearms. No excess sun exposure. Sees dentist regularly . No depression    Current outpatient prescriptions:butalbital-acetaminophen-caffeine (FIORICET) 50-325-40 MG per tablet, Take 1-2 tabs if needed with a maximum of four per day. Fill on or after November 11, Disp: 60 tablet, Rfl: 2;  Cholecalciferol (VITAMIN D3) 2000 UNITS TABS, Take 1 tablet by mouth daily., Disp: , Rfl:  lisinopril-hydrochlorothiazide (PRINZIDE,ZESTORETIC) 20-12.5 MG per tablet, Take 1 tablet by mouth  daily. For hypertension, Disp: 90 tablet, Rfl: 3;  ALPRAZolam (XANAX) 1 MG tablet, Decrease to 1/2 pill when tolerated fill on or after November 4th., Disp: 120 tablet, Rfl: 2 ALPRAZolam (XANAX) 1 MG tablet, Take 1 tablet (1 mg total) by mouth 4 (four) times daily as needed for sleep or anxiety. Decrease to 1/2 pill when tolerated fill on or after November 4, Disp: 120 tablet, Rfl: 2;  escitalopram (LEXAPRO) 10 MG tablet, Take 2 tablets (20 mg total) by mouth daily. Or as directed, Disp: 60 tablet, Rfl: 3  EXAM:  Filed Vitals:   07/24/12 1438  BP: 134/76  Pulse: 80  Temp: 98.2 F (36.8 C)  BP 134/76  Pulse 80  Temp 98.2 F (36.8 C)  Wt 144 lb (65.318 kg)   There is no height on file to calculate BMI.  GENERAL: vitals reviewed and listed above, alert, oriented, appears well hydrated and in no acute distress goo d eye contact .  HEENT: atraumatic, conjunttiva clear, no obvious abnormalities on inspection of external nose and ears OP : no lesin edema or exudate   Behind right ear is a tiny cyst no redness  Slight tenderness  NECK: no obvious masses on inspection palpation  Or adenopathy  LUNGS: clear to auscultation bilaterally, no wheezes, rales or rhonchi, good air movement  CV: HRRR, no clubbing cyanosis or  peripheral edema nl cap refill   Abdomen:  Sof,t normal bowel sounds without hepatosplenomegaly, no guarding rebound or masses no CVA tenderness  MS: moves all extremities without noticeable focal  abnormality  PSYCH: pleasant and cooperative, no obvious depression mild anxiety  Good eye contact  Lab Results  Component Value Date   HGB 11.9* 07/24/2012    ASSESSMENT AND PLAN:  Discussed the following assessment and plan:  1. Anxiety state, unspecified  escitalopram (LEXAPRO) 10 MG tablet, butalbital-acetaminophen-caffeine (FIORICET) 50-325-40 MG per tablet   give abetter trial of 20 lexapro and stay on the qid xanax for now  try to dec the butabitol  2.  HYPERTENSION, BENIGN  escitalopram (LEXAPRO) 10 MG tablet, butalbital-acetaminophen-caffeine (FIORICET) 50-325-40 MG per tablet  3. Mild anemia  POCT hemoglobin  4. HYPERLIPIDEMIA  escitalopram (LEXAPRO) 10 MG tablet, butalbital-acetaminophen-caffeine (FIORICET) 50-325-40 MG per tablet  5. VITAMIN D DEFICIENCY  escitalopram (LEXAPRO) 10 MG tablet, butalbital-acetaminophen-caffeine (FIORICET) 50-325-40 MG per tablet  6. High risk medication use  escitalopram (LEXAPRO) 10 MG tablet, butalbital-acetaminophen-caffeine (FIORICET) 50-325-40 MG per tablet   prob dependence on the fioricet  pt agrees  try to dec if possible 1/2 pill    - Patient Instructions  Give a trial of increase lexapro 20 mg per day for a tleast 2 weeks to see if helps.  If doesn't can go back down to 10 mg per day.  Call and let us know how things are going.   i agree that you body is probably dependent on the fioricet   At this time can try dec to 1/2 pill ocassionally  Do not increase the dose.   Continue blood pressure medication. Can add asa 81 mg  Qd  Or  3-4 x per week.   No change in the xanax at this time since we will try the above.   meds refilled for qid xanax and 4 per day fioricet .  Pt aware of my concerns about  Risk  Of this amount of med but will try above plan.Has been on these meds for years.  . Will continue regular monitoring in the meantime. Plan stay on the xanax and try to wean the other.    Lorretta Harp

## 2012-07-24 NOTE — Patient Instructions (Signed)
Give a trial of increase lexapro 20 mg per day for a tleast 2 weeks to see if helps.  If doesn't can go back down to 10 mg per day.  Call and let us know how things are going.   i agree that you body is probably dependent on the fioricet   At this time can try dec to 1/2 pill ocassionally  Do not increase the dose.   Continue blood pressure medication. Can add asa 81 mg  Qd  Or  3-4 x per week.   No change in the xanax at this time since we will try the above.

## 2012-09-10 ENCOUNTER — Telehealth: Payer: Self-pay

## 2012-09-10 NOTE — Telephone Encounter (Signed)
VM: Pt called and left a message for a refill on butalbital-acetaminophen-caffeine (FIORICET) 50-325-40 MG per tablet .  Rx last filled #60x 2 rf on 07/24/12. Pls advise.

## 2012-09-11 NOTE — Telephone Encounter (Signed)
This patient will need an office visit with Dr.wp  to discuss her medication refills

## 2012-09-12 ENCOUNTER — Other Ambulatory Visit: Payer: Self-pay | Admitting: Family Medicine

## 2012-09-12 DIAGNOSIS — F411 Generalized anxiety disorder: Secondary | ICD-10-CM

## 2012-09-12 DIAGNOSIS — E785 Hyperlipidemia, unspecified: Secondary | ICD-10-CM

## 2012-09-12 DIAGNOSIS — E559 Vitamin D deficiency, unspecified: Secondary | ICD-10-CM

## 2012-09-12 DIAGNOSIS — Z79899 Other long term (current) drug therapy: Secondary | ICD-10-CM

## 2012-09-12 DIAGNOSIS — I10 Essential (primary) hypertension: Secondary | ICD-10-CM

## 2012-09-12 MED ORDER — BUTALBITAL-APAP-CAFFEINE 50-325-40 MG PO TABS: ORAL_TABLET | ORAL | Status: DC

## 2012-09-12 NOTE — Telephone Encounter (Signed)
Faxed to the pharmacy.  Pt notified.

## 2012-09-12 NOTE — Telephone Encounter (Signed)
Last seen: 07/24/12 Last Filled: 07/24/12 #60 with 2 refills Has follow up with WP on 11/21/12. Please advise.  Thanks!!!

## 2012-09-12 NOTE — Telephone Encounter (Signed)
The [facility] [address]

## 2012-09-12 NOTE — Telephone Encounter (Signed)
Call in #60 Fioricet with no rf

## 2012-09-21 NOTE — Telephone Encounter (Signed)
This has been completed.

## 2012-09-24 ENCOUNTER — Telehealth: Payer: Self-pay | Admitting: Family Medicine

## 2012-09-24 NOTE — Telephone Encounter (Signed)
Pt is requesting refills.  Last seen on 07/24/12.  Last filled on 09/12/12 #60 with 0 additional refills.  Has a fu on 11/22/11.  Please advise.  Thanks!!

## 2012-09-25 ENCOUNTER — Other Ambulatory Visit: Payer: Self-pay | Admitting: Family Medicine

## 2012-09-25 DIAGNOSIS — I10 Essential (primary) hypertension: Secondary | ICD-10-CM

## 2012-09-25 DIAGNOSIS — Z79899 Other long term (current) drug therapy: Secondary | ICD-10-CM

## 2012-09-25 DIAGNOSIS — E785 Hyperlipidemia, unspecified: Secondary | ICD-10-CM

## 2012-09-25 DIAGNOSIS — F411 Generalized anxiety disorder: Secondary | ICD-10-CM

## 2012-09-25 DIAGNOSIS — E559 Vitamin D deficiency, unspecified: Secondary | ICD-10-CM

## 2012-09-25 MED ORDER — BUTALBITAL-APAP-CAFFEINE 50-325-40 MG PO TABS: ORAL_TABLET | ORAL | Status: DC

## 2012-09-25 NOTE — Telephone Encounter (Signed)
Ok to refill x 1  

## 2012-09-25 NOTE — Telephone Encounter (Signed)
Faxed to the pharmacy.

## 2012-10-05 ENCOUNTER — Telehealth: Payer: Self-pay | Admitting: Family Medicine

## 2012-10-05 NOTE — Telephone Encounter (Signed)
Pt would like refill of her Fioricet.  Please advise.  Thanks!!

## 2012-10-08 ENCOUNTER — Other Ambulatory Visit: Payer: Self-pay | Admitting: Family Medicine

## 2012-10-08 DIAGNOSIS — E785 Hyperlipidemia, unspecified: Secondary | ICD-10-CM

## 2012-10-08 DIAGNOSIS — E559 Vitamin D deficiency, unspecified: Secondary | ICD-10-CM

## 2012-10-08 DIAGNOSIS — F411 Generalized anxiety disorder: Secondary | ICD-10-CM

## 2012-10-08 DIAGNOSIS — I10 Essential (primary) hypertension: Secondary | ICD-10-CM

## 2012-10-08 DIAGNOSIS — Z79899 Other long term (current) drug therapy: Secondary | ICD-10-CM

## 2012-10-08 MED ORDER — BUTALBITAL-APAP-CAFFEINE 50-325-40 MG PO TABS: ORAL_TABLET | ORAL | Status: DC

## 2012-10-08 NOTE — Telephone Encounter (Signed)
Faxed to the pharmacy @ [phone].

## 2012-10-08 NOTE — Telephone Encounter (Signed)
Refill x1  #60 

## 2012-10-09 ENCOUNTER — Telehealth: Payer: Self-pay | Admitting: Family Medicine

## 2012-10-09 NOTE — Telephone Encounter (Signed)
The patient called and left a message on my machine.  She wanted me to call her back as soon as I received the message. Stated she needed to talk to me but did not say what about.  Returned her call but received no answer.  Left message on her machine.

## 2012-10-10 NOTE — Telephone Encounter (Signed)
Patient returned my call.  I called her back and left message on her home number.

## 2012-10-10 NOTE — Telephone Encounter (Signed)
Left message on home phone for the pt to return my call. 

## 2012-10-11 NOTE — Telephone Encounter (Signed)
Dropped her alprazolam down the sink and needs a refill.  Please help! Thanks!!

## 2012-10-12 ENCOUNTER — Other Ambulatory Visit: Payer: Self-pay | Admitting: Family Medicine

## 2012-10-12 MED ORDER — ALPRAZOLAM 1 MG PO TABS: 1.0000 mg | ORAL_TABLET | Freq: Four times a day (QID) | ORAL | Status: DC | PRN

## 2012-10-12 NOTE — Telephone Encounter (Signed)
Called to the pharmacy.

## 2012-10-12 NOTE — Telephone Encounter (Signed)
Last filled on Dec. 30th 2013.

## 2012-10-12 NOTE — Telephone Encounter (Signed)
Find out when  This was filled from the pharmacy and then  Can rx estimated remaining amount   Refill

## 2012-10-18 ENCOUNTER — Telehealth: Payer: Self-pay | Admitting: Family Medicine

## 2012-10-18 NOTE — Telephone Encounter (Signed)
Pt is requesting a refill.  Last seen on 07/24/12 and has a follow up on 11/21/12.  Last filled on 10/08/12 #60 with 0 additional refills.  Please advise.  Thanks!!

## 2012-10-19 ENCOUNTER — Other Ambulatory Visit: Payer: Self-pay | Admitting: Family Medicine

## 2012-10-19 DIAGNOSIS — E785 Hyperlipidemia, unspecified: Secondary | ICD-10-CM

## 2012-10-19 DIAGNOSIS — F411 Generalized anxiety disorder: Secondary | ICD-10-CM

## 2012-10-19 DIAGNOSIS — Z79899 Other long term (current) drug therapy: Secondary | ICD-10-CM

## 2012-10-19 DIAGNOSIS — E559 Vitamin D deficiency, unspecified: Secondary | ICD-10-CM

## 2012-10-19 DIAGNOSIS — I10 Essential (primary) hypertension: Secondary | ICD-10-CM

## 2012-10-19 MED ORDER — BUTALBITAL-APAP-CAFFEINE 50-325-40 MG PO TABS: ORAL_TABLET | ORAL | Status: DC

## 2012-10-19 NOTE — Telephone Encounter (Signed)
Sent to the pharmacy by fax.  Confirmation received.

## 2012-10-19 NOTE — Telephone Encounter (Signed)
Refill  60  X 1

## 2012-10-22 ENCOUNTER — Other Ambulatory Visit: Payer: Self-pay | Admitting: Family Medicine

## 2012-10-22 NOTE — Telephone Encounter (Signed)
Patient is requesting refills.  Last filled on 10/12/12 #70 with 0 additional refills.  Last seen on 07/24/13.  Please advise.  Thanks!!

## 2012-10-22 NOTE — Telephone Encounter (Signed)
Should have 30 pills left on maximum dosing qid  If actually filled on jan 17th   Can call in # 120  to be filled  No earlier than  Friday  Jan 31 or later .

## 2012-10-26 MED ORDER — ALPRAZOLAM 1 MG PO TABS: ORAL_TABLET | ORAL | Status: DC

## 2012-10-26 NOTE — Telephone Encounter (Signed)
Pt notified Rx refill for Xanax called into pharmacy.

## 2012-11-02 ENCOUNTER — Other Ambulatory Visit: Payer: Self-pay | Admitting: Family Medicine

## 2012-11-02 DIAGNOSIS — F411 Generalized anxiety disorder: Secondary | ICD-10-CM

## 2012-11-02 DIAGNOSIS — E559 Vitamin D deficiency, unspecified: Secondary | ICD-10-CM

## 2012-11-02 DIAGNOSIS — E785 Hyperlipidemia, unspecified: Secondary | ICD-10-CM

## 2012-11-02 DIAGNOSIS — Z79899 Other long term (current) drug therapy: Secondary | ICD-10-CM

## 2012-11-02 DIAGNOSIS — I10 Essential (primary) hypertension: Secondary | ICD-10-CM

## 2012-11-02 MED ORDER — BUTALBITAL-APAP-CAFFEINE 50-325-40 MG PO TABS: ORAL_TABLET | ORAL | Status: DC

## 2012-11-02 NOTE — Telephone Encounter (Signed)
Refill x 1  FU appt needs to be when I am here  Make it 2 slots

## 2012-11-02 NOTE — Telephone Encounter (Signed)
Patient is requesting refills.  She would like to pick this up on Sunday.  Please advise.  Thanks!!

## 2012-11-02 NOTE — Telephone Encounter (Signed)
Rx called in.  Spoke with patient and she will reschedule

## 2012-11-13 ENCOUNTER — Telehealth: Payer: Self-pay | Admitting: Family Medicine

## 2012-11-13 NOTE — Telephone Encounter (Signed)
The patient would like refills of both medications.  Please advise.  Thanks!!!

## 2012-11-14 ENCOUNTER — Other Ambulatory Visit: Payer: Self-pay | Admitting: Internal Medicine

## 2012-11-14 DIAGNOSIS — I10 Essential (primary) hypertension: Secondary | ICD-10-CM

## 2012-11-14 DIAGNOSIS — E559 Vitamin D deficiency, unspecified: Secondary | ICD-10-CM

## 2012-11-14 DIAGNOSIS — F411 Generalized anxiety disorder: Secondary | ICD-10-CM

## 2012-11-14 DIAGNOSIS — E785 Hyperlipidemia, unspecified: Secondary | ICD-10-CM

## 2012-11-14 DIAGNOSIS — Z79899 Other long term (current) drug therapy: Secondary | ICD-10-CM

## 2012-11-14 MED ORDER — ALPRAZOLAM 1 MG PO TABS: ORAL_TABLET | ORAL | Status: DC

## 2012-11-14 MED ORDER — BUTALBITAL-APAP-CAFFEINE 50-325-40 MG PO TABS: ORAL_TABLET | ORAL | Status: DC

## 2012-11-14 NOTE — Telephone Encounter (Signed)
Given to [facility] the pharmacist

## 2012-11-14 NOTE — Telephone Encounter (Signed)
Refill each x 1   Has appt on march 4th

## 2012-11-14 NOTE — Telephone Encounter (Signed)
Called to the pharmacy and given to [facility].

## 2012-11-21 ENCOUNTER — Ambulatory Visit: Payer: BC Managed Care – PPO | Admitting: Internal Medicine

## 2012-11-27 ENCOUNTER — Ambulatory Visit: Payer: BC Managed Care – PPO | Admitting: Internal Medicine

## 2012-11-28 ENCOUNTER — Telehealth: Payer: Self-pay | Admitting: Family Medicine

## 2012-11-28 NOTE — Telephone Encounter (Signed)
Patient is requesting a refill.  She has an appt on 12/11/12 @ 2:30.  Last filled on 11/15/11 #60 with 0 refills.  Please advise.  Thanks!!

## 2012-11-29 ENCOUNTER — Other Ambulatory Visit: Payer: Self-pay | Admitting: Internal Medicine

## 2012-11-29 DIAGNOSIS — E785 Hyperlipidemia, unspecified: Secondary | ICD-10-CM

## 2012-11-29 DIAGNOSIS — I10 Essential (primary) hypertension: Secondary | ICD-10-CM

## 2012-11-29 DIAGNOSIS — E559 Vitamin D deficiency, unspecified: Secondary | ICD-10-CM

## 2012-11-29 DIAGNOSIS — Z79899 Other long term (current) drug therapy: Secondary | ICD-10-CM

## 2012-11-29 DIAGNOSIS — F411 Generalized anxiety disorder: Secondary | ICD-10-CM

## 2012-11-29 MED ORDER — BUTALBITAL-APAP-CAFFEINE 50-325-40 MG PO TABS: ORAL_TABLET | ORAL | Status: DC

## 2012-11-29 NOTE — Telephone Encounter (Signed)
Refill x 1   Since appt did get delayed for weather

## 2012-12-11 ENCOUNTER — Ambulatory Visit: Payer: BC Managed Care – PPO | Admitting: Internal Medicine

## 2012-12-12 ENCOUNTER — Telehealth: Payer: Self-pay | Admitting: Family Medicine

## 2012-12-12 NOTE — Telephone Encounter (Signed)
Patient would like a refill.  Rescheduled with you on 12/25/12 due to being out of town on last appt date.  Filled on 11/29/12 #60 with 0 refills.   Please advise.  Thanks!!

## 2012-12-13 ENCOUNTER — Other Ambulatory Visit: Payer: Self-pay | Admitting: Family Medicine

## 2012-12-13 DIAGNOSIS — I10 Essential (primary) hypertension: Secondary | ICD-10-CM

## 2012-12-13 DIAGNOSIS — Z79899 Other long term (current) drug therapy: Secondary | ICD-10-CM

## 2012-12-13 DIAGNOSIS — E785 Hyperlipidemia, unspecified: Secondary | ICD-10-CM

## 2012-12-13 DIAGNOSIS — F411 Generalized anxiety disorder: Secondary | ICD-10-CM

## 2012-12-13 DIAGNOSIS — E559 Vitamin D deficiency, unspecified: Secondary | ICD-10-CM

## 2012-12-13 MED ORDER — BUTALBITAL-APAP-CAFFEINE 50-325-40 MG PO TABS: ORAL_TABLET | ORAL | Status: DC

## 2012-12-13 NOTE — Telephone Encounter (Signed)
Per [facility], fill 1 time until she comes in on April 1.

## 2012-12-25 ENCOUNTER — Telehealth: Payer: Self-pay | Admitting: Internal Medicine

## 2012-12-25 ENCOUNTER — Ambulatory Visit (INDEPENDENT_AMBULATORY_CARE_PROVIDER_SITE_OTHER): Payer: BC Managed Care – PPO | Admitting: Internal Medicine

## 2012-12-25 ENCOUNTER — Encounter: Payer: Self-pay | Admitting: Internal Medicine

## 2012-12-25 VITALS — BP 130/80 | HR 84 | Temp 98.4°F | Wt 145.0 lb

## 2012-12-25 DIAGNOSIS — D649 Anemia, unspecified: Secondary | ICD-10-CM

## 2012-12-25 DIAGNOSIS — E559 Vitamin D deficiency, unspecified: Secondary | ICD-10-CM

## 2012-12-25 DIAGNOSIS — F411 Generalized anxiety disorder: Secondary | ICD-10-CM

## 2012-12-25 DIAGNOSIS — G4700 Insomnia, unspecified: Secondary | ICD-10-CM

## 2012-12-25 DIAGNOSIS — Z79899 Other long term (current) drug therapy: Secondary | ICD-10-CM

## 2012-12-25 DIAGNOSIS — E785 Hyperlipidemia, unspecified: Secondary | ICD-10-CM

## 2012-12-25 DIAGNOSIS — I10 Essential (primary) hypertension: Secondary | ICD-10-CM

## 2012-12-25 LAB — BASIC METABOLIC PANEL
BUN: 22 mg/dL (ref 6–23)
Chloride: 101 mEq/L (ref 96–112)
Creatinine, Ser: 0.9 mg/dL (ref 0.4–1.2)
Glucose, Bld: 96 mg/dL (ref 70–99)
Potassium: 4.7 mEq/L (ref 3.5–5.1)

## 2012-12-25 LAB — CBC WITH DIFFERENTIAL/PLATELET
Basophils Absolute: 0 10*3/uL (ref 0.0–0.1)
Eosinophils Absolute: 0 10*3/uL (ref 0.0–0.7)
HCT: 34.4 % — ABNORMAL LOW (ref 36.0–46.0)
Lymphs Abs: 1.1 10*3/uL (ref 0.7–4.0)
MCV: 98.3 fl (ref 78.0–100.0)
Monocytes Absolute: 0.3 10*3/uL (ref 0.1–1.0)
Neutrophils Relative %: 75.4 % (ref 43.0–77.0)
Platelets: 271 10*3/uL (ref 150.0–400.0)
RDW: 13.3 % (ref 11.5–14.6)

## 2012-12-25 LAB — T4, FREE: Free T4: 0.79 ng/dL (ref 0.60–1.60)

## 2012-12-25 LAB — LIPID PANEL
Cholesterol: 261 mg/dL — ABNORMAL HIGH (ref 0–200)
Total CHOL/HDL Ratio: 3

## 2012-12-25 LAB — HEPATIC FUNCTION PANEL
Bilirubin, Direct: 0 mg/dL (ref 0.0–0.3)
Total Bilirubin: 0.5 mg/dL (ref 0.3–1.2)

## 2012-12-25 LAB — IBC PANEL
Saturation Ratios: 24 % (ref 20.0–50.0)
Transferrin: 252.6 mg/dL (ref 212.0–360.0)

## 2012-12-25 LAB — TSH: TSH: 1.18 u[IU]/mL (ref 0.35–5.50)

## 2012-12-25 MED ORDER — LISINOPRIL-HYDROCHLOROTHIAZIDE 20-12.5 MG PO TABS: 1.0000 | ORAL_TABLET | Freq: Every day | ORAL | Status: DC

## 2012-12-25 MED ORDER — ALPRAZOLAM 1 MG PO TABS: ORAL_TABLET | ORAL | Status: DC

## 2012-12-25 MED ORDER — BUTALBITAL-APAP-CAFFEINE 50-325-40 MG PO TABS: ORAL_TABLET | ORAL | Status: DC

## 2012-12-25 NOTE — Patient Instructions (Addendum)
Consider trying ativan as this may give you less rebound  For the anxiety and is in the same medicine group.  Continue the lexapro 10 mg per day .   Will look into again  The fioricet without the caffiene because this may aggravate your anxiety for which you are taking the xananx  . Would rather you control intake of caffeine without the pills.    I understand that you have been on these meds for years.    Exercise is a good  Thing also for anxiety . And stress and heart health.  Will notify you  of labs when available.

## 2012-12-25 NOTE — Telephone Encounter (Signed)
Opened in error

## 2012-12-25 NOTE — Progress Notes (Signed)
Chief Complaint  Patient presents with  . Follow-up    meds  . Hypertension    HPI: Patient comes in today for follow up of  multiple medical problems.  Medication management. Last visit in the fall   rescheduled for weather delays etc.   Bp still taking medicine . Needs new rx.  Anxiety Taking 10 mg per day of  Lexapro.    10 mg   Per day. No different that the 20 . So she decreased the dose. Xanax  ; has tried to cut down but then ends up trying to one at take more so feels that she needs 1 mg 4 times a day takes it about  10 am  And   About 3 and  Then 7 or so and then at   11    Helps sleep  If takes late Tried doing half and didn't do well   With this.  More nervous.   She states she doesn't take medicine when she has to go out to drive. Not a lot of breakfast  And eats lunch mid day. Eats dinner. Snack.   No current regular exercise no cardiovascular symptoms. fioricet :   Taking still taking this medicine and feels bad if doesn't take it.  Feels it gives her energy Energy   4 per day. That she states for at least 10 years. Taking fioricet    .  Around noon and then  Around 3 pm and then   Not at bedtime   .   She does drink some sweet tea and occasional other caffeine but not a lot.  ROS: See pertinent positives and negatives per HPI. No tremors chest pain syncope Family history Sis had severe  Anemia and hospitalized question hemoglobin of 6    .     rx with b12    Bruises on legs.   An doing rehab. Now.   Was very worried about her.   Past Medical History  Diagnosis Date  . Eczema   . Headache   . Hypertension     Family History  Problem Relation Age of Onset  . Coronary artery disease Father   . Heart disease Father   . COPD Mother     History   Social History  . Marital Status: Married    Spouse Name: N/A    Number of Children: N/A  . Years of Education: N/A   Social History Main Topics  . Smoking status: Never Smoker   . Smokeless tobacco: Never Used  .  Alcohol Use: 0.6 oz/week    1 Glasses of wine per week  . Drug Use: No  . Sexually Active: Yes   Other Topics Concern  . 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  Has smoke detector and wears seat belts.  No firearms. No excess sun exposure. Sees dentist regularly . No depression          Outpatient Encounter Prescriptions as of 12/25/2012  Medication Sig Dispense Refill  . ALPRAZolam (XANAX) 1 MG tablet One tablet by mouth 4 times a day as needed.  120 tablet  0  . butalbital-acetaminophen-caffeine (FIORICET, ESGIC) 50-325-40 MG per tablet Take 3-4 tabs daily or as directed  100 tablet  1  . Cholecalciferol (VITAMIN D3) 2000 UNITS TABS Take 1  tablet by mouth daily.      Marland Kitchen lisinopril-hydrochlorothiazide (PRINZIDE,ZESTORETIC) 20-12.5 MG per tablet Take 1 tablet by mouth daily. For hypertension  90 tablet  3  . [DISCONTINUED] ALPRAZolam (XANAX) 1 MG tablet One tablet by mouth 4 times a day as needed.  120 tablet  0  . [DISCONTINUED] butalbital-acetaminophen-caffeine (FIORICET) 50-325-40 MG per tablet Take 1-2 tabs if needed with a maximum of four per day.  60 tablet  0  . [DISCONTINUED] butalbital-acetaminophen-caffeine (FIORICET, ESGIC) 50-325-40 MG per tablet Take 3-4 tabs daily or as directed      . [DISCONTINUED] lisinopril-hydrochlorothiazide (PRINZIDE,ZESTORETIC) 20-12.5 MG per tablet Take 1 tablet by mouth daily. For hypertension  90 tablet  3  . escitalopram (LEXAPRO) 10 MG tablet Take 2 tablets (20 mg total) by mouth daily. Or as directed  60 tablet  3  . [DISCONTINUED] ALPRAZolam (XANAX) 1 MG tablet Decrease to 1/2 pill when tolerated fill on or after November 4th.  120 tablet  2   No facility-administered encounter medications on file as of 12/25/2012.    EXAM:  BP 130/80  Pulse 84  Temp(Src) 98.4 F (36.9 C) (Oral)  Wt 145 lb (65.772 kg)  BMI 25.69  kg/m2  Body mass index is 25.69 kg/(m^2).  GENERAL: vitals reviewed and listed above, alert, oriented, appears well hydrated and in no acute distress gets very distressed anxious and agitated when discussing the possibility of changing her medication. Cognitively intact  HEENT: atraumatic, conjunctiva  clear, no obvious abnormalities on inspection of external nose and ears OP : no lesion edema or exudate   NECK: no obvious masses on inspection palpation no masses noted no adenopathy or bruit  LUNGS: clear to auscultation bilaterally, no wheezes, rales or rhonchi, good air movement  CV: HRRR, no gallops or murmurs noted no clubbing cyanosis or  peripheral edema nl cap refill   MS: moves all extremities without noticeable focal  abnormality  PSYCH: pleasant and cooperative, no obvious depression appears to be a bit anxious but good eye contact and cognition no tremor is noted.  ASSESSMENT AND PLAN:  Discussed the following assessment and plan:  Anxiety state, unspecified - Plan: Basic metabolic panel, CBC with Differential, Hepatic function panel, TSH, T4, free, Lipid panel, Vitamin D 25 hydroxy, IBC panel, lisinopril-hydrochlorothiazide (PRINZIDE,ZESTORETIC) 20-12.5 MG per tablet  High risk medication use - Plan: lisinopril-hydrochlorothiazide (PRINZIDE,ZESTORETIC) 20-12.5 MG per tablet  HYPERTENSION, BENIGN - Plan: Basic metabolic panel, CBC with Differential, Hepatic function panel, TSH, T4, free, Lipid panel, Vitamin D 25 hydroxy, IBC panel, lisinopril-hydrochlorothiazide (PRINZIDE,ZESTORETIC) 20-12.5 MG per tablet  HYPERLIPIDEMIA - Plan: Basic metabolic panel, CBC with Differential, Hepatic function panel, TSH, T4, free, Lipid panel, Vitamin D 25 hydroxy, IBC panel, lisinopril-hydrochlorothiazide (PRINZIDE,ZESTORETIC) 20-12.5 MG per tablet  VITAMIN D DEFICIENCY - Plan: Basic metabolic panel, CBC with Differential, Hepatic function panel, TSH, T4, free, Lipid panel, Vitamin D 25  hydroxy, IBC panel, lisinopril-hydrochlorothiazide (PRINZIDE,ZESTORETIC) 20-12.5 MG per tablet  Mild anemia - Plan: Basic metabolic panel, CBC with Differential, Hepatic function panel, TSH, T4, free, Lipid panel, Vitamin D 25 hydroxy, IBC panel  INSOMNIA - Plan: lisinopril-hydrochlorothiazide (PRINZIDE,ZESTORETIC) 20-12.5 MG per tablet  very resistant  To changing  medication.  To another in the group.  States that there was a friend who was on Ativan and had a hard time with it she feels that the current regimen Takes off  the edge. And she states that since she's been on this over 10 years is very hard for her  to think about changing. She was unaware when she first started these medications that it would be so difficult to not be on them.  Doesn't take med  For driving mostly stays at home.    I think she might be helped by a longer acting been so but she'll think about it and is worried about changing. He has tried to go down from 4 a day it sounds like she might get rebound anxiety. However all this is complicated by daily Fioricet use which has caffeine in it explained to her that caffeine can make her anxiety worse even though increases her energy and she may be dependent on that also in the past I was unable to find a medication that had the butabarbital without the caffeine. We'll look again consider changing to a preparation where she would have caffeine controlled by foods and not in a pill.   She states that she doesn't think the Lexapro the higher dose is really doing much uncertain if the 10 mg is doing much but at the beginning we started it was helpful for her would like her to continue on the controller medicine she is very anxious and concerned about changes . Which of course is part of her anxiety  We'll check full blood panel today including a vitamin D refill the alprazolam at 120 with 2 refills at this time she was down for a day no more  At this point we'll give Rx 100 of the  Fioricet  so she can take 3-4 a day at the most and still want her to wean we'll look into other preparations.  Laboratory monitoring I think she is dependent on all of these medicines and discussed risk benefit but uncertain how to help her change this regimen. Other than above.  Check for her anemia today interesting that sister had a severe anemia possibly nutritional with B12 problem. Patient thinks that her sister just in the right. -Patient advised to return or notify health care team  if symptoms worsen or persist or new concerns arise.  We had to fax and call in the medicine because my printer and computer wasn't working adequately to hand her  the prescription today.  Patient Instructions  Consider trying ativan as this may give you less rebound  For the anxiety and is in the same medicine group.  Continue the lexapro 10 mg per day .   Will look into again  The fioricet without the caffiene because this may aggravate your anxiety for which you are taking the xananx  . Would rather you control intake of caffeine without the pills.    I understand that you have been on these meds for years.    Exercise is a good  Thing also for anxiety . And stress and heart health.  Will notify you  of labs when available.    Neta Mends. Sakeena Teall M.D.  Total visit 40 mins > 50% spent counseling and coordinating care .   at next refill will strongly consider  changing to plain tylenol butalbitol   ( no caffiene) there is a preparation on the market esgic for this .

## 2012-12-26 LAB — LDL CHOLESTEROL, DIRECT: Direct LDL: 168.2 mg/dL

## 2013-01-21 ENCOUNTER — Telehealth: Payer: Self-pay | Admitting: Family Medicine

## 2013-01-21 NOTE — Telephone Encounter (Signed)
Last seen and filled on 12/25/12 for 120 with 0 additional refills Has a follow up on 04/26/13 Please advise.   Would like to pick this up at the pharmacy on Wednesday Thanks!!

## 2013-01-24 ENCOUNTER — Other Ambulatory Visit: Payer: Self-pay | Admitting: Family Medicine

## 2013-01-24 MED ORDER — ALPRAZOLAM 1 MG PO TABS: ORAL_TABLET | ORAL | Status: DC

## 2013-01-24 NOTE — Telephone Encounter (Signed)
Ok to refill 120  X 3

## 2013-01-24 NOTE — Telephone Encounter (Signed)
Called and left on voicemail. 

## 2013-02-05 ENCOUNTER — Telehealth: Payer: Self-pay | Admitting: Family Medicine

## 2013-02-05 NOTE — Telephone Encounter (Signed)
Last seen and filled on 12/25/12 #100 with 1 additional refill.  Has a follow up appt scheduled for 04/26/13.  Please advise.  Thanks!!

## 2013-02-07 NOTE — Telephone Encounter (Signed)
Please document that patient doesn't want to change to plain Esgic  With  Transitioning  Off of caffiene with beverages.   Still advise decrease to 3 per day and less . Refill x 1

## 2013-02-08 ENCOUNTER — Other Ambulatory Visit: Payer: Self-pay | Admitting: Internal Medicine

## 2013-02-08 MED ORDER — BUTALBITAL-APAP-CAFFEINE 50-325-40 MG PO TABS: ORAL_TABLET | ORAL | Status: DC

## 2013-02-08 NOTE — Telephone Encounter (Signed)
Called to the pharmacy and left on voicemail. 

## 2013-02-28 ENCOUNTER — Telehealth: Payer: Self-pay | Admitting: Family Medicine

## 2013-02-28 NOTE — Telephone Encounter (Signed)
Last filled on 02/08/13 #100 with 0 additional refills Last seen on 12/25/12 Has follow up on 04/26/13 Please advise. Thanks!

## 2013-03-01 ENCOUNTER — Other Ambulatory Visit: Payer: Self-pay | Admitting: Internal Medicine

## 2013-03-01 MED ORDER — BUTALBITAL-APAP-CAFFEINE 50-325-40 MG PO TABS: ORAL_TABLET | ORAL | Status: DC

## 2013-03-01 NOTE — Telephone Encounter (Signed)
Call in #60 with no rf 

## 2013-03-01 NOTE — Telephone Encounter (Signed)
I called in script 

## 2013-03-01 NOTE — Addendum Note (Signed)
Addended by: Aniceto Boss A on: 03/01/2013 03:57 PM   Modules accepted: Orders

## 2013-03-13 ENCOUNTER — Telehealth: Payer: Self-pay | Admitting: Family Medicine

## 2013-03-13 NOTE — Telephone Encounter (Signed)
Last filled by Dr. Clent Ridges on 03/01/13 #60 with 0 additional refills.  Usually gets 100 Last seen on 12/25/12 Has upcoming appointment on 04/26/13 Please advise.  Thanks!

## 2013-03-14 MED ORDER — BUTALBITAL-APAP-CAFFEINE 50-325-40 MG PO TABS: ORAL_TABLET | ORAL | Status: DC

## 2013-03-14 NOTE — Telephone Encounter (Signed)
Refill x 1; 100

## 2013-03-14 NOTE — Telephone Encounter (Signed)
Called to the pharmacy and left on voicemail. 

## 2013-04-03 ENCOUNTER — Telehealth: Payer: Self-pay | Admitting: Family Medicine

## 2013-04-03 NOTE — Telephone Encounter (Signed)
Last filled on 03/14/13 #100 with 0 additional refills Pt would like it to be called in on Thurs or Fri. Has upcoming appointment on 04/26/13 Last seen acutely on 12/25/12 Please advise Thanks!

## 2013-04-03 NOTE — Telephone Encounter (Signed)
Refill x1 

## 2013-04-04 ENCOUNTER — Other Ambulatory Visit: Payer: Self-pay | Admitting: Internal Medicine

## 2013-04-04 MED ORDER — BUTALBITAL-APAP-CAFFEINE 50-325-40 MG PO TABS: ORAL_TABLET | ORAL | Status: DC

## 2013-04-16 ENCOUNTER — Other Ambulatory Visit: Payer: Self-pay | Admitting: Family Medicine

## 2013-04-16 MED ORDER — ALPRAZOLAM 1 MG PO TABS: ORAL_TABLET | ORAL | Status: DC

## 2013-04-25 ENCOUNTER — Other Ambulatory Visit: Payer: Self-pay | Admitting: Family Medicine

## 2013-04-25 MED ORDER — BUTALBITAL-APAP-CAFFEINE 50-325-40 MG PO TABS: ORAL_TABLET | ORAL | Status: DC

## 2013-04-26 ENCOUNTER — Ambulatory Visit: Payer: BC Managed Care – PPO | Admitting: Internal Medicine

## 2013-05-01 ENCOUNTER — Ambulatory Visit (INDEPENDENT_AMBULATORY_CARE_PROVIDER_SITE_OTHER): Payer: BC Managed Care – PPO | Admitting: Internal Medicine

## 2013-05-01 ENCOUNTER — Encounter: Payer: Self-pay | Admitting: Internal Medicine

## 2013-05-01 ENCOUNTER — Encounter: Payer: Self-pay | Admitting: Family Medicine

## 2013-05-01 VITALS — BP 110/66 | HR 80 | Temp 98.5°F | Wt 141.0 lb

## 2013-05-01 DIAGNOSIS — E559 Vitamin D deficiency, unspecified: Secondary | ICD-10-CM

## 2013-05-01 DIAGNOSIS — F1320 Sedative, hypnotic or anxiolytic dependence, uncomplicated: Secondary | ICD-10-CM

## 2013-05-01 DIAGNOSIS — Z79899 Other long term (current) drug therapy: Secondary | ICD-10-CM

## 2013-05-01 DIAGNOSIS — D7589 Other specified diseases of blood and blood-forming organs: Secondary | ICD-10-CM

## 2013-05-01 DIAGNOSIS — F411 Generalized anxiety disorder: Secondary | ICD-10-CM

## 2013-05-01 DIAGNOSIS — I10 Essential (primary) hypertension: Secondary | ICD-10-CM

## 2013-05-01 DIAGNOSIS — D649 Anemia, unspecified: Secondary | ICD-10-CM | POA: Insufficient documentation

## 2013-05-01 HISTORY — DX: Sedative, hypnotic or anxiolytic dependence, uncomplicated: F13.20

## 2013-05-01 LAB — HEPATIC FUNCTION PANEL
ALT: 19 U/L (ref 0–35)
AST: 20 U/L (ref 0–37)
Albumin: 5 g/dL (ref 3.5–5.2)
Total Protein: 8 g/dL (ref 6.0–8.3)

## 2013-05-01 LAB — CBC WITH DIFFERENTIAL/PLATELET
Eosinophils Relative: 0.2 % (ref 0.0–5.0)
HCT: 42 % (ref 36.0–46.0)
Hemoglobin: 14.1 g/dL (ref 12.0–15.0)
Lymphs Abs: 1.9 10*3/uL (ref 0.7–4.0)
MCV: 99.7 fl (ref 78.0–100.0)
Monocytes Absolute: 0.5 10*3/uL (ref 0.1–1.0)
Monocytes Relative: 6.2 % (ref 3.0–12.0)
Neutro Abs: 6 10*3/uL (ref 1.4–7.7)
WBC: 8.4 10*3/uL (ref 4.5–10.5)

## 2013-05-01 MED ORDER — ALPRAZOLAM 1 MG PO TABS: ORAL_TABLET | ORAL | Status: DC

## 2013-05-01 MED ORDER — BUTALBITAL-APAP-CAFFEINE 50-325-40 MG PO TABS: ORAL_TABLET | ORAL | Status: DC

## 2013-05-01 NOTE — Progress Notes (Signed)
Chief Complaint  Patient presents with  . Follow-up    Meds hypertension    HPI: Fu visit .  Since her last visit no major changes in her health she stopped the Lexapro about a month ago can she says it wasn't really helping her anxiety or mood. He states she is going to through some rough personal times and feels pretty stressed however. Not deeply depressed or despondent.  She continues to take alprazolam 1 mg 4 times a day and Fioricet 4 times a day  She states she takes it as directed but reminded that when we tried to go down on her dose she asked for higher frequency dosing. She states she just feels better when she takes it on a regular basis she states that she doesn't drive after taking the medication.Taking  Qid   8 - 9 and then 2 and then around 6 and then bed. 9   Not taking iron at this time no active bleeding  2000mg . a vitamin D isn't taking a calcium right now  Bp medication . Taking every day   Denies any falling accidents takes alcohol rarely once a week or couple times a month at the most. Denies other medications except as discussed.  Having worse time sleeping since external factors.  ROS: See pertinent positives and negatives per HPI. No current chest pain shortness of breath neurologic signs.  Past Medical History  Diagnosis Date  . Eczema   . Headache(784.0)   . Hypertension     Family History  Problem Relation Age of Onset  . Coronary artery disease Father   . Heart disease Father   . COPD Mother     History   Social History  . Marital Status: Married    Spouse Name: N/A    Number of Children: N/A  . Years of Education: N/A   Social History Main Topics  . Smoking status: Never Smoker   . Smokeless tobacco: Never Used  . Alcohol Use: 0.6 oz/week    1 Glasses of wine per week  . Drug Use: No  . Sexually Active: Yes   Other Topics Concern  . 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  Has smoke detector and wears seat belts.  No firearms. No excess sun exposure. Sees dentist regularly . No depression          Outpatient Encounter Prescriptions as of 05/01/2013  Medication Sig Dispense Refill  . ALPRAZolam (XANAX) 1 MG tablet One tablet by mouth 4 times a day as needed.fill on or after august 24  120 tablet  1  . butalbital-acetaminophen-caffeine (FIORICET, ESGIC) 50-325-40 MG per tablet Take 3-4 tabs daily  And wean as  Directed  90 tablet  0  . Cholecalciferol (VITAMIN D3) 2000 UNITS TABS Take 1 tablet by mouth daily.      Marland Kitchen lisinopril-hydrochlorothiazide (PRINZIDE,ZESTORETIC) 20-12.5 MG per tablet Take 1 tablet by mouth daily. For hypertension  90 tablet  3  . [DISCONTINUED] ALPRAZolam (XANAX) 1 MG tablet One tablet by mouth 4 times a day as needed.  120 tablet  0  . [DISCONTINUED] butalbital-acetaminophen-caffeine (FIORICET, ESGIC) 50-325-40 MG per tablet Take 3-4 tabs daily or as directed  60 tablet  0  . escitalopram (LEXAPRO) 10 MG tablet Take 2 tablets (20 mg total) by mouth daily.  Or as directed  60 tablet  3   No facility-administered encounter medications on file as of 05/01/2013.    EXAM:  BP 110/66  Pulse 80  Temp(Src) 98.5 F (36.9 C) (Oral)  Wt 141 lb (63.957 kg)  BMI 24.98 kg/m2  SpO2 97%  Body mass index is 24.98 kg/(m^2).  GENERAL: vitals reviewed and listed above, alert, oriented, appears well hydrated and in no acute distress appears tearful at times and somewhat anxious.  HEENT: atraumatic, conjunctiva  clear, no obvious abnormalities on inspection of external nose and ears  NECK: no obvious masses on inspection palpation  LUNGS: clear to auscultation bilaterally, no wheezes, rales or rhonchi, good air movement CV: HRRR, no clubbing cyanosis or  peripheral edema nl cap refill  Abdomen soft without organomegaly guarding or rebound MS: moves all extremities without noticeable  focal  abnormality  PSYCH:  cooperative,  somewhat anxious and distressed today but cognitively intact and normal speech Lab Results  Component Value Date   WBC 8.4 05/01/2013   HGB 14.1 05/01/2013   HCT 42.0 05/01/2013   PLT 293.0 05/01/2013   GLUCOSE 96 12/25/2012   CHOL 261* 12/25/2012   TRIG 94.0 12/25/2012   HDL 75.10 12/25/2012   LDLDIRECT 168.2 12/25/2012   ALT 19 05/01/2013   AST 20 05/01/2013   NA 138 12/25/2012   K 4.7 12/25/2012   CL 101 12/25/2012   CREATININE 0.9 12/25/2012   BUN 22 12/25/2012   CO2 31 12/25/2012   TSH 1.18 12/25/2012    ASSESSMENT AND PLAN:  Discussed the following assessment and plan:  VITAMIN D DEFICIENCY - Plan: CBC with Differential, Hepatic function panel, Vitamin D 25 hydroxy, IBC panel, Vitamin D 25 hydroxy, Methylmalonic acid, serum  Anxiety state, unspecified - Plan: CBC with Differential, Hepatic function panel, Vitamin D 25 hydroxy, IBC panel, Vitamin D 25 hydroxy, Methylmalonic acid, serum  HYPERTENSION, BENIGN - Plan: CBC with Differential, Hepatic function panel, Vitamin D 25 hydroxy, IBC panel, Vitamin D 25 hydroxy, Methylmalonic acid, serum  Anemia - Plan: CBC with Differential, Hepatic function panel, Vitamin D 25 hydroxy, IBC panel, Vitamin D 25 hydroxy, Methylmalonic acid, serum  Macrocytosis - Plan: CBC with Differential, Hepatic function panel, Vitamin D 25 hydroxy, IBC panel, Vitamin D 25 hydroxy, Methylmalonic acid, serum  High risk medication use  Dependence, barbiturates ? - Have come to the conclusion that she is dependent on this medication based on her history and the fact that she feels better when she takes it  Discussed with her that I'm concerned about dependence and side effects and that she's getting withdrawal symptoms when she tries to stop. Advised we get a specialist involved psychiatrist or other however at that point she decided she would try to wean this medication on her own. Discussed a slow wean.  Reminded her that even though she is  taking the medicine as directed either at needed medicines and she requested the higher dosing.  The Fioricet medication is meant to be short-term and she is apparently been on this for years.  Alprazolam in addition has been being used for years weaning of that medication may be more problematic and should be under psychiatric care if needed because she indeed does have anxiety. How much his rebound remains to be seen.  Fortunately she denies other medication use and no regular alcohol use.  Unfortunately she went off Lexapro because she says it didn't work. Thus she is on no controller medication.  Certain about the amount  of Tylenol she has to take also monitor liver tests  We'll recheck her anemia to make sure that is not continuing more evaluation if needed  Check vitamin D currently she is taking 2000 units  Hand refilled her alprazolam for 2 months worth.  Prescription for 90 of the Fioricet to use to wean and have close followup for 6 weeks from now if we fail on this regimen we will refer.  Also had her sign controlled substance contract today. (This was our  oversight in not presenting to this to her previously electronic record issue) -Patient advised to return or notify health care team  if symptoms worsen or persist or new concerns arise.  Patient Instructions   I believe that you are dependent on these medications and are probable getting withdrawal sx if not taking these,.    The can be a danger to your health   And can add to depression.   You should not drive under the influence of these medications.   Would like you to see a specialist who can help with this    But in the short run  You can try to wean on your own of the butabital.  Decrease to 3 per day for 2-3 weeks then  Try 2.5 or 2 per day  For 2-3 weeks    Then 1-1.5 per day for few weeks      You will  May  Also  feel  caffeine withdrawal also    Need to check liver tests   Blood pressure is good    Still have some concerns about the anemia   Check iron levels and  Other today  consider seeing a blood specialist  irt is possible that medication could do this.   Will let  You know about vit d dosage   But should take  600 - 1000 mg calcium per day in diet and pills if tolerating ok.  ROV in about 6 weeks or as needed to see how the medication wean is doing.    Neta Mends. Minetta Krisher M.D. Total visit > 50% spent counseling and coordinating care

## 2013-05-01 NOTE — Patient Instructions (Addendum)
   I believe that you are dependent on these medications and are probable getting withdrawal sx if not taking these,.    The can be a danger to your health   And can add to depression.   You should not drive under the influence of these medications.   Would like you to see a specialist who can help with this    But in the short run  You can try to wean on your own of the butabital.  Decrease to 3 per day for 2-3 weeks then  Try 2.5 or 2 per day  For 2-3 weeks    Then 1-1.5 per day for few weeks      You will  May  Also  feel  caffeine withdrawal also    Need to check liver tests   Blood pressure is good   Still have some concerns about the anemia   Check iron levels and  Other today  consider seeing a blood specialist  irt is possible that medication could do this.   Will let  You know about vit d dosage   But should take  600 - 1000 mg calcium per day in diet and pills if tolerating ok.  ROV in about 6 weeks or as needed to see how the medication wean is doing.

## 2013-05-02 LAB — VITAMIN D 25 HYDROXY (VIT D DEFICIENCY, FRACTURES): Vit D, 25-Hydroxy: 20 ng/mL — ABNORMAL LOW (ref 30–89)

## 2013-05-06 LAB — METHYLMALONIC ACID, SERUM: Methylmalonic Acid, Quant: 0.4 umol/L — ABNORMAL HIGH (ref <0.40)

## 2013-05-28 ENCOUNTER — Encounter: Payer: Self-pay | Admitting: Family Medicine

## 2013-05-28 ENCOUNTER — Telehealth: Payer: Self-pay | Admitting: Family Medicine

## 2013-05-28 NOTE — Telephone Encounter (Signed)
Pt called requesting a refill of Fioricet.  Last seen and filled on 05/01/13 #90 with 0 refills.  Has an upcoming appt on 06/11/13.

## 2013-05-29 NOTE — Telephone Encounter (Signed)
Estimated 3 per day use based on refill  Disp 40 #  And will reevaluated at next ROV.

## 2013-05-30 ENCOUNTER — Other Ambulatory Visit: Payer: Self-pay | Admitting: Family Medicine

## 2013-05-30 MED ORDER — BUTALBITAL-APAP-CAFFEINE 50-325-40 MG PO TABS: ORAL_TABLET | ORAL | Status: DC

## 2013-05-30 NOTE — Telephone Encounter (Signed)
Called and given to the pharmacist by telephone.

## 2013-06-11 ENCOUNTER — Ambulatory Visit: Payer: BC Managed Care – PPO | Admitting: Internal Medicine

## 2013-06-12 ENCOUNTER — Telehealth: Payer: Self-pay | Admitting: Family Medicine

## 2013-06-12 NOTE — Telephone Encounter (Signed)
Pt has follow up on 06/28/13.  Last filled on 05/30/13 #40.  Please advise.  Thanks!!

## 2013-06-12 NOTE — Telephone Encounter (Signed)
change sig to wean from 3 per day  To 2 per day as directed  Disp40#  Keep appt.

## 2013-06-13 ENCOUNTER — Other Ambulatory Visit: Payer: Self-pay | Admitting: Family Medicine

## 2013-06-13 MED ORDER — BUTALBITAL-APAP-CAFFEINE 50-325-40 MG PO TABS: ORAL_TABLET | ORAL | Status: DC

## 2013-06-13 NOTE — Telephone Encounter (Signed)
Called to the pharmacy and left on voicemail with new directions.

## 2013-06-25 ENCOUNTER — Telehealth: Payer: Self-pay | Admitting: Family Medicine

## 2013-06-25 NOTE — Telephone Encounter (Signed)
Last filled on 06/13/13 #40 with 0 additional refills Has an upcoming appt on 07/09/13 Last seen on 05/01/13 Please advise. Thanks!

## 2013-06-26 ENCOUNTER — Other Ambulatory Visit: Payer: Self-pay | Admitting: Family Medicine

## 2013-06-26 MED ORDER — BUTALBITAL-APAP-CAFFEINE 50-325-40 MG PO TABS: ORAL_TABLET | ORAL | Status: DC

## 2013-06-26 NOTE — Telephone Encounter (Signed)
Called to the pharmacy and left on voicemail. 

## 2013-06-26 NOTE — Telephone Encounter (Signed)
Refill 42 # only till her appt.

## 2013-06-28 ENCOUNTER — Ambulatory Visit: Payer: BC Managed Care – PPO | Admitting: Internal Medicine

## 2013-07-08 ENCOUNTER — Telehealth: Payer: Self-pay | Admitting: Family Medicine

## 2013-07-08 NOTE — Telephone Encounter (Signed)
Patient is requesting refill to be picked up on Wednesday 07/10/13 Last filled on 06/26/13 #42 with 0 additional refills Has a follow up scheduled on 07/23/13 Last seen on 05/01/13 Please advise. Thanks!

## 2013-07-09 ENCOUNTER — Ambulatory Visit: Payer: BC Managed Care – PPO | Admitting: Internal Medicine

## 2013-07-10 ENCOUNTER — Other Ambulatory Visit: Payer: Self-pay | Admitting: Family Medicine

## 2013-07-10 MED ORDER — BUTALBITAL-APAP-CAFFEINE 50-325-40 MG PO TABS: ORAL_TABLET | ORAL | Status: DC

## 2013-07-10 NOTE — Telephone Encounter (Signed)
Per WP, #40 to be called in.

## 2013-07-10 NOTE — Telephone Encounter (Signed)
Called to the pharmacy and left on voicemail. 

## 2013-07-10 NOTE — Telephone Encounter (Signed)
Ok 40 # 3rd cancellation rescedule

## 2013-07-10 NOTE — Telephone Encounter (Signed)
Patient calling back to follow up on refill request.

## 2013-07-15 ENCOUNTER — Telehealth: Payer: Self-pay | Admitting: Family Medicine

## 2013-07-15 NOTE — Telephone Encounter (Signed)
Last filled and seen on 05/01/13 #120 with 1 additional refills Has an upcoming appointment on 07/23/13. Please advise.  Thanks!

## 2013-07-17 ENCOUNTER — Other Ambulatory Visit: Payer: Self-pay | Admitting: Family Medicine

## 2013-07-17 MED ORDER — ALPRAZOLAM 1 MG PO TABS: ORAL_TABLET | ORAL | Status: DC

## 2013-07-17 NOTE — Telephone Encounter (Signed)
Called to the pharmacy and left on voicemail. 

## 2013-07-17 NOTE — Telephone Encounter (Signed)
Refill x1 

## 2013-07-22 ENCOUNTER — Telehealth: Payer: Self-pay | Admitting: Family Medicine

## 2013-07-22 NOTE — Telephone Encounter (Signed)
Last filled on 07/10/13 #40 with 0 additional refills Has moved her appt again to 08/06/13. Last seen on 05/01/13. Please advise. Thanks!

## 2013-07-23 ENCOUNTER — Ambulatory Visit: Payer: BC Managed Care – PPO | Admitting: Internal Medicine

## 2013-07-24 ENCOUNTER — Other Ambulatory Visit: Payer: Self-pay | Admitting: Family Medicine

## 2013-07-24 MED ORDER — BUTALBITAL-APAP-CAFFEINE 50-325-40 MG PO TABS: ORAL_TABLET | ORAL | Status: DC

## 2013-07-24 NOTE — Telephone Encounter (Signed)
Called to the pharmacy.  Given to pharmacist.

## 2013-07-24 NOTE — Telephone Encounter (Addendum)
Decrease to  1- 2  x per day disp 30#   Patient needs to wean off medication in the next weeks.

## 2013-08-06 ENCOUNTER — Ambulatory Visit (INDEPENDENT_AMBULATORY_CARE_PROVIDER_SITE_OTHER): Payer: BC Managed Care – PPO | Admitting: Internal Medicine

## 2013-08-06 ENCOUNTER — Encounter: Payer: Self-pay | Admitting: Internal Medicine

## 2013-08-06 VITALS — BP 102/60 | HR 68 | Temp 97.9°F | Wt 148.0 lb

## 2013-08-06 DIAGNOSIS — I10 Essential (primary) hypertension: Secondary | ICD-10-CM

## 2013-08-06 DIAGNOSIS — E559 Vitamin D deficiency, unspecified: Secondary | ICD-10-CM

## 2013-08-06 DIAGNOSIS — F411 Generalized anxiety disorder: Secondary | ICD-10-CM

## 2013-08-06 DIAGNOSIS — D649 Anemia, unspecified: Secondary | ICD-10-CM

## 2013-08-06 DIAGNOSIS — F1320 Sedative, hypnotic or anxiolytic dependence, uncomplicated: Secondary | ICD-10-CM

## 2013-08-06 MED ORDER — BUTALBITAL-APAP-CAFFEINE 50-325-40 MG PO TABS: ORAL_TABLET | ORAL | Status: DC

## 2013-08-06 MED ORDER — ALPRAZOLAM 1 MG PO TABS: ORAL_TABLET | ORAL | Status: DC

## 2013-08-06 NOTE — Progress Notes (Signed)
Chief Complaint  Patient presents with  . Follow-up    HPI: Pt comes  in today . For followup delayed on medications. For fu meds..  has a number of family problems that are since resolved and had to reschedule her  office visits. See phone calls and notes. Since her last visit she has been trying to do healthier things over the last month. Feels a bit better with it   Cutting out of sugar and eating better  .  30 - 45 minutes    Walked  outisde 4 x per week.   Recently  Weeks  Ask about risk of fthis.   Also has a vitamin calcium vitamin D has questions about this had been taking 2000.  She's been taking the Fioricet about 3 times a day still taking her Xanax 4 times a day pleads that it is a very bad time for her to try to stop it and she really doesn't want to have another doctor in her life. She doesn't feel comfortable talking to most clinicians about her difficulties.  She denies significant alcohol other drugs.  Asks about over-the-counter multivitamin has vitamin D calcium and other vitamins has some constipation unsure if it's related to that  Wt Readings from Last 3 Encounters:  08/06/13 148 lb (67.132 kg)  05/01/13 141 lb (63.957 kg)  12/25/12 145 lb (65.772 kg)    ROS: See pertinent positives and negatives per HPI.  Past Medical History  Diagnosis Date  . Eczema   . Headache(784.0)   . Hypertension     Family History  Problem Relation Age of Onset  . Coronary artery disease Father   . Heart disease Father   . COPD Mother     History   Social History  . Marital Status: Married    Spouse Name: N/A    Number of Children: N/A  . Years of Education: N/A   Social History Main Topics  . Smoking status: Never Smoker   . Smokeless tobacco: Never Used  . Alcohol Use: 0.6 oz/week    1 Glasses of wine per week  . Drug Use: No  . Sexual Activity: Yes   Other Topics Concern  . 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  Has smoke detector and wears seat belts.  No firearms. No excess sun exposure. Sees dentist regularly . No depression          Outpatient Encounter Prescriptions as of 08/06/2013  Medication Sig  . ALPRAZolam (XANAX) 1 MG tablet One tablet by mouth 4 times a day as needed.fill on or after NOvemb er 20th  . butalbital-acetaminophen-caffeine (FIORICET, ESGIC) 50-325-40 MG per tablet Take 1-2 tabs daily  And wean as directed.  . Cholecalciferol (VITAMIN D3) 2000 UNITS TABS Take 1 tablet by mouth daily.  Marland Kitchen lisinopril-hydrochlorothiazide (PRINZIDE,ZESTORETIC) 20-12.5 MG per tablet Take 1 tablet by mouth daily. For hypertension  . [DISCONTINUED] ALPRAZolam (XANAX) 1 MG tablet One tablet by mouth 4 times a day as needed.fill on or after august 24  . [DISCONTINUED] butalbital-acetaminophen-caffeine (FIORICET, ESGIC) 50-325-40 MG per tablet Take 1-2 tabs daily  And wean as directed.  . [DISCONTINUED] escitalopram (LEXAPRO) 10 MG tablet Take 2 tablets (20 mg total) by mouth daily. Or as directed    EXAM:  BP 102/60  Pulse 68  Temp(Src)  97.9 F (36.6 C) (Oral)  Wt 148 lb (67.132 kg)  SpO2 99%  Body mass index is 26.22 kg/(m^2).  GENERAL: vitals reviewed and listed above, alert, oriented, appears well hydrated and in no acute distress she is only mildly anxious today no tremor HEENT: atraumatic, conjunctiva  clear, no obvious abnormalities on inspection of external nose and ears OP : no lesion edema or exudate  NECK: no obvious masses on inspection palpation no bruit LUNGS: clear to auscultation bilaterally, no wheezes, rales or rhonchi, good air movement CV: HRRR, no clubbing cyanosis or  peripheral edema nl cap refill  Abdomen soft without organomegaly guarding or rebound MS: moves all extremities without noticeable focal  abnormality PSYCH: pleasant and cooperative, no obvious depression or anxiety Lab Results   Component Value Date   WBC 8.4 05/01/2013   HGB 14.1 05/01/2013   HCT 42.0 05/01/2013   PLT 293.0 05/01/2013   GLUCOSE 96 12/25/2012   CHOL 261* 12/25/2012   TRIG 94.0 12/25/2012   HDL 75.10 12/25/2012   LDLDIRECT 168.2 12/25/2012   ALT 19 05/01/2013   AST 20 05/01/2013   NA 138 12/25/2012   K 4.7 12/25/2012   CL 101 12/25/2012   CREATININE 0.9 12/25/2012   BUN 22 12/25/2012   CO2 31 12/25/2012   TSH 1.18 12/25/2012    ASSESSMENT AND PLAN:  Discussed the following assessment and plan:  Anxiety state, unspecified - Continues on Xanax as she has for years continue exercise other modalities  HYPERTENSION, BENIGN  VITAMIN D DEFICIENCY - Equivalent 2000 units a day continue  Dependence, barbiturates ? - Patient please to not stop during the holidays we'll go down to 2 a day and then more as tolerated refilled 60x2 for followup after the new year  Anemia - Better on last CBC follow she could restart the aspirin 81 mg if she wishes We talked about referral to behavioral health to get help with these medications the patient is resistant to going for many reasons at least at this time would not be against consultation has difficulties with establishing new doctors. We'll continue at this time discussed with her my discomfort with continued refills of the Fioricet to be taken daily. -Patient advised to return or notify health care team  if symptoms worsen or persist or new concerns arise. hh of 2  Patient Instructions  Decrease to twice a day maximum of the fioricet.  For reasons dsicussed I still  want you  to wean this medication  .   Exercise is good  Increase fruits and vegges  To help with constipation.     ROV after the New Year.   Vit d 1600 IU  Ok per day  Or 2000 IU    Neta Mends. Brieanne Mignone M.D.  Total visit > 50% spent counseling and coordinating care     After patient left reviewed the record her last CBC was within normal limits although her MMA was slightly up. Her MCV was much better in the  normal range. At her followup visit we will discuss this. Also at next visit we will make sure to review her health care maintenance. She  Has had a gyne

## 2013-08-06 NOTE — Patient Instructions (Addendum)
Decrease to twice a day maximum of the fioricet.  For reasons dsicussed I still  want you  to wean this medication  .   Exercise is good  Increase fruits and vegges  To help with constipation.     ROV after the New Year.   Vit d 1600 IU  Ok per day  Or 2000 IU

## 2013-08-26 ENCOUNTER — Telehealth: Payer: Self-pay | Admitting: Family Medicine

## 2013-08-26 NOTE — Telephone Encounter (Signed)
The patient called to report that she had this medication filled on 08/15/13.  She dropped the remainder of her prescription into sink water this morning.  She is asking for the remainder of the prescription to be filled for the month.  She understands that her insurance will not pay for this and she will have to pay out of pocket.  She is taking 4 per day.  Please advise.  Thanks!

## 2013-09-09 ENCOUNTER — Telehealth: Payer: Self-pay | Admitting: Family Medicine

## 2013-09-09 NOTE — Telephone Encounter (Signed)
Patient is requesting a new prescription be called in so she can pick this up at the pharmacy on Thursday.

## 2013-09-10 NOTE — Telephone Encounter (Signed)
Checked the system.  Pt should have refills on file at the pharmacy.  Called the pharmacy and spoke to the pharmacy tech.  By mistake the refills were not placed in their system.  Problem resolved and they will fill the rx.

## 2013-09-23 ENCOUNTER — Telehealth: Payer: Self-pay | Admitting: Family Medicine

## 2013-09-23 NOTE — Telephone Encounter (Signed)
See next phone message on refills

## 2013-09-23 NOTE — Telephone Encounter (Signed)
Patient called and left a message on my machine.  Would like to pick this up on Friday or Saturday of this week. Last seen and filled on 08/06/13 #60 with 1 additional refill She has a future appt scheduled on 10/08/13 Please advise.  Thanks!

## 2013-09-27 MED ORDER — BUTALBITAL-APAP-CAFFEINE 50-325-40 MG PO TABS: ORAL_TABLET | ORAL | Status: DC

## 2013-09-27 NOTE — Telephone Encounter (Signed)
Refill x1 

## 2013-09-27 NOTE — Telephone Encounter (Signed)
[facility]

## 2013-10-08 ENCOUNTER — Ambulatory Visit: Payer: BC Managed Care – PPO | Admitting: Internal Medicine

## 2013-10-15 ENCOUNTER — Telehealth: Payer: Self-pay | Admitting: Family Medicine

## 2013-10-15 NOTE — Telephone Encounter (Signed)
Last filled on 09/27/13 #60 with 0 additional refills Has a future appointment on 11/05/13 Last seen on 08/06/2013. Please advise.  Thanks!  Pt said she is not doing well with weaning.  She is taking 1-3 daily.

## 2013-10-16 MED ORDER — BUTALBITAL-APAP-CAFFEINE 50-325-40 MG PO TABS: ORAL_TABLET | ORAL | Status: DC

## 2013-10-16 NOTE — Telephone Encounter (Signed)
Called to the pharmacy and left on voicemail. 

## 2013-10-16 NOTE — Telephone Encounter (Signed)
Keep trying to wean  Disp 40 #

## 2013-10-30 ENCOUNTER — Telehealth: Payer: Self-pay | Admitting: Family Medicine

## 2013-10-30 NOTE — Telephone Encounter (Signed)
The pt would like a refill of her butalbital-acetaminophen-caffeine (FIORICET, ESGIC) 50-325-40 MG per tablet and  ALPRAZolam (XANAX) 1 MG tablet.  She is requesting # 60 Fioricet instead of #40.  Please advise if you will increase the quantify. She would like to pick up both medication at [facility] on November 04, 2013.

## 2013-10-31 NOTE — Telephone Encounter (Signed)
She canceled the last 2 appts and 40 were given as total the time for fu .   Request denied to increase amount at this time. Refill alprazolam  X 1   This should get her to her next appt.,  If she hasnt had this please have her do the urine drug screen before picking up rx.

## 2013-11-01 ENCOUNTER — Encounter: Payer: Self-pay | Admitting: Family Medicine

## 2013-11-01 ENCOUNTER — Other Ambulatory Visit: Payer: Self-pay | Admitting: Family Medicine

## 2013-11-01 MED ORDER — ALPRAZOLAM 1 MG PO TABS: ORAL_TABLET | ORAL | Status: DC

## 2013-11-01 MED ORDER — BUTALBITAL-APAP-CAFFEINE 50-325-40 MG PO TABS: ORAL_TABLET | ORAL | Status: DC

## 2013-11-01 NOTE — Telephone Encounter (Signed)
Pt notified to pick up at the front desk after she submits to drug screen.

## 2013-11-01 NOTE — Telephone Encounter (Signed)
[facility], patient must pick up at the front desk and submit to drug screening.  Left message on the below listed number for the pt to return my call.

## 2013-11-04 ENCOUNTER — Telehealth: Payer: Self-pay | Admitting: Family Medicine

## 2013-11-04 NOTE — Telephone Encounter (Signed)
error 

## 2013-11-04 NOTE — Telephone Encounter (Signed)
Pt called back and left a message on my machine.  She wanted to know if the prescriptions could be called to the pharmacy and if she could do the drug screen when she came in to see Dr. Fabian SharpPanosh at her next scheduled visit.  Per WP, pt must pick up at the front desk and submit to drug screening.  Tried to return the patient's call.  Left a message on her home number for the pt to return my call.

## 2013-11-05 ENCOUNTER — Ambulatory Visit: Payer: BC Managed Care – PPO | Admitting: Internal Medicine

## 2013-11-05 NOTE — Telephone Encounter (Signed)
Patient notified to pick up at the front desk and submit to testing.  Pt has agreed and said she will come on 11/06/13.

## 2013-11-18 ENCOUNTER — Ambulatory Visit: Payer: BC Managed Care – PPO | Admitting: Internal Medicine

## 2013-11-25 ENCOUNTER — Telehealth: Payer: Self-pay | Admitting: Family Medicine

## 2013-11-25 NOTE — Telephone Encounter (Signed)
Pt would like a refill of her Fioricet.  She has an upcoming appointment on 12/03/13.  Is requesting #60. Please advise.  Thanks!

## 2013-11-25 NOTE — Telephone Encounter (Signed)
Denied.

## 2013-11-26 NOTE — Telephone Encounter (Signed)
Pt notified she will need to be seen on 12/03/13 in order to receive a refill of her medication.

## 2013-11-26 NOTE — Telephone Encounter (Signed)
Left message on home/cell for the pt to return my call. 

## 2013-11-27 ENCOUNTER — Ambulatory Visit: Payer: BC Managed Care – PPO | Admitting: Internal Medicine

## 2013-11-27 ENCOUNTER — Encounter: Payer: Self-pay | Admitting: Internal Medicine

## 2013-11-27 DIAGNOSIS — Z79899 Other long term (current) drug therapy: Secondary | ICD-10-CM | POA: Insufficient documentation

## 2013-11-27 HISTORY — DX: Other long term (current) drug therapy: Z79.899

## 2013-11-27 NOTE — Progress Notes (Unsigned)
tox screen form 2 12 15   Shows neg for butabital and alprazolam  disc with [facility]  Should show if taking as prescribed .  Will discuss at ov that has been advanced forward by patient ( some weather related )

## 2013-12-03 ENCOUNTER — Encounter: Payer: Self-pay | Admitting: Internal Medicine

## 2013-12-03 ENCOUNTER — Ambulatory Visit (INDEPENDENT_AMBULATORY_CARE_PROVIDER_SITE_OTHER): Payer: BC Managed Care – PPO | Admitting: Internal Medicine

## 2013-12-03 VITALS — BP 130/72 | Temp 98.2°F | Ht 63.0 in | Wt 147.0 lb

## 2013-12-03 DIAGNOSIS — F411 Generalized anxiety disorder: Secondary | ICD-10-CM

## 2013-12-03 DIAGNOSIS — D649 Anemia, unspecified: Secondary | ICD-10-CM

## 2013-12-03 DIAGNOSIS — I10 Essential (primary) hypertension: Secondary | ICD-10-CM

## 2013-12-03 DIAGNOSIS — Z79899 Other long term (current) drug therapy: Secondary | ICD-10-CM

## 2013-12-03 MED ORDER — ALPRAZOLAM 1 MG PO TABS: ORAL_TABLET | ORAL | Status: DC

## 2013-12-03 MED ORDER — BUTALBITAL-APAP-CAFFEINE 50-325-40 MG PO TABS: ORAL_TABLET | ORAL | Status: DC

## 2013-12-03 NOTE — Progress Notes (Signed)
Chief Complaint  Patient presents with  . Follow-up    Pt needs refills of medication    HPI: Brooke Pennington Come in today  Due for fu in jan but appt delayed multiple times( x 4)   Patient requested and also weather considerations  NO change in health.  Except recent Some allergy sx. No se stopped up. Taking Claritin.  Itching also.    Xanax and fioricet.    Dec to 1-2 per day. For amount  Is out of this. Xanax  Is taking.  Feb 7th takin qid  In am and 1-2 pm and 6 pm and before bed.   Ran out last week. Of the fioricet.   States she was without the Xanax for about 2 weeks and didn't feel that good at episode of losing her pills down the sink  ROS: See pertinent positives and negatives per HPI. No chest pain shortness of breath denies dv abuse etc  No etoh.taking vit d no alcohol intake reported bp stable   Past Medical History  Diagnosis Date  . Eczema   . Headache(784.0)   . Hypertension     Family History  Problem Relation Age of Onset  . Coronary artery disease Father   . Heart disease Father   . COPD Mother     History   Social History  . Marital Status: Married    Spouse Name: N/A    Number of Children: N/A  . Years of Education: N/A   Social History Main Topics  . Smoking status: Never Smoker   . Smokeless tobacco: Never Used  . Alcohol Use: 0.6 oz/week    1 Glasses of wine per week  . Drug Use: No  . Sexual Activity: Yes   Other Topics Concern  . 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  Has smoke detector and wears seat belts.  No firearms. No excess sun exposure. Sees dentist regularly . No depression          Outpatient Encounter Prescriptions as of 12/03/2013  Medication Sig  . ALPRAZolam (XANAX) 1 MG tablet One tablet by mouth 4 times a day as needed.  . butalbital-acetaminophen-caffeine (FIORICET, ESGIC) 50-325-40 MG per  tablet Take 1-2 tabs daily  And wean as directed.  . Cholecalciferol (VITAMIN D3) 2000 UNITS TABS Take 1 tablet by mouth daily.  Marland Kitchen lisinopril-hydrochlorothiazide (PRINZIDE,ZESTORETIC) 20-12.5 MG per tablet Take 1 tablet by mouth daily. For hypertension  . [DISCONTINUED] ALPRAZolam (XANAX) 1 MG tablet One tablet by mouth 4 times a day as needed.  . [DISCONTINUED] ALPRAZolam (XANAX) 1 MG tablet One tablet by mouth 4 times a day as needed.  . [DISCONTINUED] butalbital-acetaminophen-caffeine (FIORICET, ESGIC) 50-325-40 MG per tablet Take 1-2 tabs daily  And wean as directed.    EXAM:  BP 130/72  Temp(Src) 98.2 F (36.8 C) (Oral)  Ht 5\' 3"  (1.6 m)  Wt 147 lb (66.679 kg)  BMI 26.05 kg/m2  Body mass index is 26.05 kg/(m^2).  GENERAL: vitals reviewed and listed above, alert, oriented, appears well hydrated and in no acute distress  HEENT: atraumatic, conjunctiva  clear, no obvious abnormalities on inspection of external nose and ears congested face non tender  OP : no lesion edema or exudate  NECK: no obvious masses on inspection palpation  LUNGS: clear to auscultation  bilaterally, no wheezes, rales or rhonchi, good air movement CV: HRRR, no clubbing cyanosis or  peripheral edema nl cap refill  MS: moves all extremities without noticeable focal  abnormality PSYCH: pleasant and cooperative,  anxiety TOX screen from 2 12 15   Was negative for both butalbital and alprazolam  ASSESSMENT AND PLAN:  Discussed the following assessment and plan:  Anxiety state, unspecified  Mild anemia  Medication management failed tox screen  High risk medication use  HYPERTENSION, BENIGN Pt said xanax lost cause of Dropped in sink   Went a few weeks with out  medicine . ! Was nervous off it.  Cannot remember when  Ran out and went back on . Said she had some withdrawal.  We'll give the patient the benefit of the doubt that she was out of her medicine when she had the tox screen done. She denies  diversion or personal pressures. I told her I want her to stop the Fioricet she asked for another 20-40 I said no but would give her 10 only. No more refills I told her I cannot explain the negative alprazolam screen unless she was off it for while. But then she would not be running out of the medication at this time except for the episode of losing it in the sink. Will continue to only give one month at that time until things are stable. Repeat tox screen today. accidentally Printed out 2 months refills for xanax.  and but only given one months worth , decide on refill next month Should be taking vits with b and d  -Patient advised to return or notify health care team  If new concerns arise.  went back to  Patient Instructions  Calcium 1000 - 1200 mg per day.  Including in diet. Continue vit d .   Get off the fioricet .  Disp #10 at this time.   No more refills   If loose med down the sink will not refill  Early for that reason.  If anxiety is the problem we need to use different tactic.  At this time .    Try otc flonase  Nasal spray .  every day for   Nose allergy in addition toi other allergy sx .  tox urine  screen today and regular  At this time.       Neta MendsWanda K. Mariamawit Depaoli M.D.  Pre visit review using our clinic review tool, if applicable. No additional management support is needed unless otherwise documented below in the visit note. Total visit 40mins > 50% spent counseling and coordinating care

## 2013-12-03 NOTE — Patient Instructions (Addendum)
Calcium 1000 - 1200 mg per day.  Including in diet. Continue vit d .   Get off the fioricet .  Disp #10 at this time.   No more refills   If loose med down the sink will not refill  Early for that reason.  If anxiety is the problem we need to use different tactic.  At this time .    Try otc flonase  Nasal spray .  every day for   Nose allergy in addition toi other allergy sx .  tox urine  screen today and regular  At this time.

## 2013-12-17 ENCOUNTER — Telehealth: Payer: Self-pay | Admitting: Family Medicine

## 2013-12-17 NOTE — Telephone Encounter (Signed)
The patient called and left a message on my machine.  She would like a refill of her butalbital-acetaminophen-caffeine (FIORICET, ESGIC) 50-325-40 MG per tablet. Please advise.  Thanks!

## 2013-12-18 NOTE — Telephone Encounter (Signed)
See my last note     Her urine screen showed that she was not taking the xanax at the last visit .  Please explain  As she was given enough for 4 x per day every day.  Also she needs to stop the fiorinal . No refill   If she  is anxious we can start another anxiety medication such as  Buspirone 7.5 mg bid disp 60 and we can increase  Dose to gt to controlling levels.  return office visit in about one month.

## 2013-12-20 NOTE — Telephone Encounter (Signed)
[facility] she would like to come in and retake urine screen.  Insists she has been taking her alprazolam as instructed. Please advise.  Thanks!

## 2013-12-20 NOTE — Telephone Encounter (Signed)
Ok But   Document her last dose 2 days doses of medication in the record before getting the screen .  (But she still may benefit from the buspar . )

## 2013-12-23 NOTE — Telephone Encounter (Signed)
Left message on home/cell for the pt to return my call. 

## 2013-12-24 NOTE — Telephone Encounter (Signed)
Left message on home/cell for the pt to return my call. 

## 2013-12-25 NOTE — Telephone Encounter (Signed)
Left a message on the home/cell for the pt to return my call.

## 2013-12-25 NOTE — Telephone Encounter (Signed)
Called and left a message that the pt may come to the office and redo her urine screen.  If she has any further questions than she may call the office.

## 2014-01-01 ENCOUNTER — Telehealth: Payer: Self-pay | Admitting: Internal Medicine

## 2014-01-01 NOTE — Telephone Encounter (Signed)
That is fine [name] MessierKathy.  WP will tell the pt when to return.

## 2014-01-01 NOTE — Telephone Encounter (Signed)
Pt states you called and advised pt to make appt.  Pt made appt for Fri am at 8:30.  Pt had another appt  May  12 that we canceled. Is that OK?

## 2014-01-03 ENCOUNTER — Ambulatory Visit (INDEPENDENT_AMBULATORY_CARE_PROVIDER_SITE_OTHER): Payer: BC Managed Care – PPO | Admitting: Internal Medicine

## 2014-01-03 ENCOUNTER — Encounter: Payer: Self-pay | Admitting: Internal Medicine

## 2014-01-03 ENCOUNTER — Telehealth: Payer: Self-pay | Admitting: Internal Medicine

## 2014-01-03 VITALS — BP 126/62 | Temp 98.2°F | Ht 63.0 in | Wt 153.0 lb

## 2014-01-03 DIAGNOSIS — Z79899 Other long term (current) drug therapy: Secondary | ICD-10-CM

## 2014-01-03 DIAGNOSIS — F411 Generalized anxiety disorder: Secondary | ICD-10-CM

## 2014-01-03 DIAGNOSIS — I10 Essential (primary) hypertension: Secondary | ICD-10-CM

## 2014-01-03 MED ORDER — BUTALBITAL-APAP-CAFFEINE 50-325-40 MG PO TABS: ORAL_TABLET | ORAL | Status: DC

## 2014-01-03 MED ORDER — BUSPIRONE HCL 7.5 MG PO TABS: 7.5000 mg | ORAL_TABLET | Freq: Two times a day (BID) | ORAL | Status: DC

## 2014-01-03 MED ORDER — ALPRAZOLAM 1 MG PO TABS: ORAL_TABLET | ORAL | Status: DC

## 2014-01-03 NOTE — Progress Notes (Signed)
Chief Complaint  Patient presents with  . Follow-up    Has not been taking Fioricet.  Taking alprazolam and needs a refill.  Has not retaken the urine screen.    HPI: FU from last monhmedication management  And didn't tell me last time. taht she dropped th pills   Took yesterday and 2 x now spilled  In sink. So was out again  Spaces x 4   Was out   For 1-2 weeks.  And didn't feel that good but now back on it  10 -4 pmand bedtime 10  Pm and one this am.  Pleased for more fioricet  Even though hasnt taken it recently would fell better  See last rx  ROS: See pertinent positives and negatives per HPI.denies new s says still stressed famiy isses she cant change still denies bullying diversion hoarding and other issues   Past Medical History  Diagnosis Date  . Eczema   . Headache(784.0)   . Hypertension     Family History  Problem Relation Age of Onset  . Coronary artery disease Father   . Heart disease Father   . COPD Mother     History   Social History  . Marital Status: Married    Spouse Name: N/A    Number of Children: N/A  . Years of Education: N/A   Social History Main Topics  . Smoking status: Never Smoker   . Smokeless tobacco: Never Used  . Alcohol Use: 0.6 oz/week    1 Glasses of wine per week  . Drug Use: No  . Sexual Activity: Yes   Other Topics Concern  . 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  Has smoke detector and wears seat belts.  No firearms. No excess sun exposure. Sees dentist regularly . No depression      Only 1 car at this time          Outpatient Encounter Prescriptions as of 01/03/2014  Medication Sig  . ALPRAZolam (XANAX) 1 MG tablet One tablet by mouth 4 times a day as needed.  . Cholecalciferol (VITAMIN D3) 2000 UNITS TABS Take 1 tablet by mouth daily.  Marland Kitchen lisinopril-hydrochlorothiazide (PRINZIDE,ZESTORETIC) 20-12.5  MG per tablet Take 1 tablet by mouth daily. For hypertension  . [DISCONTINUED] ALPRAZolam (XANAX) 1 MG tablet One tablet by mouth 4 times a day as needed.  . busPIRone (BUSPAR) 7.5 MG tablet Take 1 tablet (7.5 mg total) by mouth 2 (two) times daily.  . butalbital-acetaminophen-caffeine (FIORICET, ESGIC) 50-325-40 MG per tablet Take 1-2 tabs  as needed   And wean as directed.  . [DISCONTINUED] butalbital-acetaminophen-caffeine (FIORICET, ESGIC) 50-325-40 MG per tablet Take 1-2 tabs daily  And wean as directed.    EXAM:  BP 126/62  Temp(Src) 98.2 F (36.8 C)  Ht 5\' 3"  (1.6 m)  Wt 153 lb (69.4 kg)  BMI 27.11 kg/m2  Body mass index is 27.11 kg/(m^2).  GENERAL: vitals reviewed and listed above, alert, oriented, appears well hydrated and in no acute distress mild anxiety  HEENT: atraumatic, conjunctiva  clear, no obvious abnormalities on inspection of external nose and earsNECK: no obvious masses on inspection palpation  PSYCH: oriented anxious cognitively nol and speech normal  Good eye contact   ASSESSMENT AND PLAN:  Discussed the following assessment and plan:  Anxiety state,  unspecified - would rather lora but nto nervous denies diversion  other use follow closley still needs controller med and she is very anx about this  trial buspar  cont alpra  Medication management failed tox screen - pleads for 10 of the fioricet  had been off for weeks but then took left over bid  xanax "keeps her even and no more losing pills she says  . took 4 in last 24h  HYPERTENSION, BENIGN - good. Patient aware that  Scenario could have resulted in dc from practice but at this time will continue  She feels that she has been on these meds for 20 years and is also psychologically dependent on them  But interesting ly was able to tolerate a 2 week withdrawal time I want her to give a trial of buspar and inc slowly and can remain on alp for now  [facility] for [facility] fior[facility]et  .20 to have around  rx for 10 only .   Close fu planned  -Patient advised to return or notify health care team  if r new concerns arise.  Patient Instructions  Begin busprione as directed  1 twice a day  Can continue with alprazolam    But hopefully  may not need .  This.   I do not want you to continue on the fioricet  .     But will give you 10 for  Now for psychological reasons . This is a sedative  .   return office visit in a month .  Neta MendsWanda K. Domanique Huesman M.D.   Pre visit review using our clinic review tool, if applicable. No additional management support is needed unless otherwise documented below in the visit note. Total visit 40mins > 50% spent counseling and coordinating care   tox screen done

## 2014-01-03 NOTE — Patient Instructions (Signed)
Begin busprione as directed  1 twice a day  Can continue with alprazolam    But hopefully  may not need .  This.   I do not want you to continue on the fioricet  .     But will give you 10 for  Now for psychological reasons . This is a sedative  .   return office visit in a month .

## 2014-01-03 NOTE — Telephone Encounter (Signed)
Relevant patient education mailed to patient.  

## 2014-01-13 ENCOUNTER — Telehealth: Payer: Self-pay | Admitting: Family Medicine

## 2014-01-13 NOTE — Telephone Encounter (Signed)
Patient called and left a message on my machine for a return phone call.

## 2014-01-14 NOTE — Telephone Encounter (Signed)
Left a message at the below listed number for the pt to return my call. 

## 2014-01-15 NOTE — Telephone Encounter (Signed)
Left a message for the pt to return my call.  

## 2014-01-15 NOTE — Telephone Encounter (Signed)
Patient would like to know the results of her drug screen  She would also like a refills of her  ALPRAZolam (XANAX) 1 MG tablet busPIRone (BUSPAR) 7.5 MG tablet butalbital-acetaminophen-caffeine (FIORICET, ESGIC) 50-325-40 MG per tablet  PLEASE ADVISE.  THANKS!

## 2014-01-15 NOTE — Telephone Encounter (Signed)
Patient returned my call.  Tried calling her back.  Asked her to call back and leave any information on my machine.

## 2014-01-16 NOTE — Telephone Encounter (Signed)
She should have a refill on the buspirone .at same dose  We can choose to increase dose, but can wait until comes in   Increase to 15 mg bid ( 2 bid) or 15 bid . Disp 60 .  Shouldn't be out of the xanax yet  As we rxed this  On 4 10?  If needed we can rx  But say do not  fill until 5 /7 /15   tox screen was ok  .  I dont want her to be on the fioricet  .  No.

## 2014-01-16 NOTE — Telephone Encounter (Signed)
Left a message on home number  for the pt to return my call. 

## 2014-01-20 NOTE — Telephone Encounter (Signed)
Left a message at the below listed number for the pt to return my call. 

## 2014-01-21 NOTE — Telephone Encounter (Signed)
Left a message at the below listed number for the pt to return my call. 

## 2014-01-24 ENCOUNTER — Encounter: Payer: Self-pay | Admitting: Family Medicine

## 2014-01-24 NOTE — Telephone Encounter (Signed)
Tried reaching the patient by telephone.  No answer.  Will now send a letter.

## 2014-01-28 ENCOUNTER — Encounter: Payer: Self-pay | Admitting: Internal Medicine

## 2014-02-04 ENCOUNTER — Encounter: Payer: Self-pay | Admitting: Internal Medicine

## 2014-02-04 ENCOUNTER — Ambulatory Visit: Payer: BC Managed Care – PPO | Admitting: Internal Medicine

## 2014-02-04 ENCOUNTER — Ambulatory Visit (INDEPENDENT_AMBULATORY_CARE_PROVIDER_SITE_OTHER): Payer: BC Managed Care – PPO | Admitting: Internal Medicine

## 2014-02-04 VITALS — BP 130/80 | HR 67 | Temp 98.3°F | Wt 154.0 lb

## 2014-02-04 DIAGNOSIS — F411 Generalized anxiety disorder: Secondary | ICD-10-CM

## 2014-02-04 DIAGNOSIS — E559 Vitamin D deficiency, unspecified: Secondary | ICD-10-CM

## 2014-02-04 DIAGNOSIS — I10 Essential (primary) hypertension: Secondary | ICD-10-CM

## 2014-02-04 DIAGNOSIS — Z79899 Other long term (current) drug therapy: Secondary | ICD-10-CM

## 2014-02-04 MED ORDER — LISINOPRIL-HYDROCHLOROTHIAZIDE 20-12.5 MG PO TABS: 1.0000 | ORAL_TABLET | Freq: Every day | ORAL | Status: DC

## 2014-02-04 MED ORDER — ALPRAZOLAM 1 MG PO TABS: ORAL_TABLET | ORAL | Status: DC

## 2014-02-04 MED ORDER — BUSPIRONE HCL 7.5 MG PO TABS: 7.5000 mg | ORAL_TABLET | Freq: Two times a day (BID) | ORAL | Status: DC

## 2014-02-04 MED ORDER — BUTALBITAL-APAP-CAFFEINE 50-325-40 MG PO TABS: ORAL_TABLET | ORAL | Status: DC

## 2014-02-04 NOTE — Progress Notes (Signed)
Chief Complaint  Patient presents with  . Follow-up    medications anxiety HT     HPI: Fu    medications .   Anxiety "Doing ok." Alprazolam.  Qid doesn't take pre driving had tox screen last rx   Butalbital.  No rebound.   Helpsher calm?   No other caffeine.   Got 10 last month pleads for 10 more and then will quit all together   Tried buspar and  finished the bottle not refilled  Noted that? Didn't   Helped some finished it.   Low dose no difference.  First week  Nausea . Marland Kitchen. Ran out of medication didn't get filled and ran out a week ago.   Out of bp med for a week.  Called in   Prescribed bp medication  Years ago.   One time. Has ? About dx  ( at [facility] years ago based on one BP readings)  Taking vit d no iron    ROS: See pertinent positives and negatives per HPI.no cp sob   Past Medical History  Diagnosis Date  . Eczema   . Headache(784.0)   . Hypertension     Family History  Problem Relation Age of Onset  . Coronary artery disease Father   . Heart disease Father   . COPD Mother     History   Social History  . Marital Status: Married    Spouse Name: N/A    Number of Children: N/A  . Years of Education: N/A   Social History Main Topics  . Smoking status: Never Smoker   . Smokeless tobacco: Never Used  . Alcohol Use: 0.6 oz/week    1 Glasses of wine per week  . Drug Use: No  . Sexual Activity: Yes   Other Topics Concern  . 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  Has smoke detector and wears seat belts.  No firearms. No excess sun exposure. Sees dentist regularly . No depression      Only 1 car at this time          Outpatient Encounter Prescriptions as of 02/04/2014  Medication Sig  . ALPRAZolam (XANAX) 1 MG tablet One tablet by mouth 4 times a day as needed.  . busPIRone (BUSPAR) 7.5 MG tablet Take 1 tablet (7.5 mg total) by mouth  2 (two) times daily. Increase to 1.5 pills bid after 2 weeks then 2 po bid  .after 1-2 weeks or as directed  . butalbital-acetaminophen-caffeine (FIORICET, ESGIC) 50-325-40 MG per tablet Take 1-2 tabs  as needed   And wean as directed.  . Cholecalciferol (VITAMIN D3) 2000 UNITS TABS Take 1 tablet by mouth daily.  Marland Kitchen. lisinopril-hydrochlorothiazide (PRINZIDE,ZESTORETIC) 20-12.5 MG per tablet Take 1 tablet by mouth daily. For hypertension  . [DISCONTINUED] ALPRAZolam (XANAX) 1 MG tablet One tablet by mouth 4 times a day as needed.  . [DISCONTINUED] busPIRone (BUSPAR) 7.5 MG tablet Take 1 tablet (7.5 mg total) by mouth 2 (two) times daily.  . [DISCONTINUED] butalbital-acetaminophen-caffeine (FIORICET, ESGIC) 50-325-40 MG per tablet Take 1-2 tabs  as needed   And wean as directed.  . [DISCONTINUED] lisinopril-hydrochlorothiazide (PRINZIDE,ZESTORETIC) 20-12.5 MG per tablet Take 1 tablet by mouth daily. For hypertension    EXAM:  BP 130/80  Pulse 67  Temp(Src) 98.3 F (36.8 C) (Oral)  Wt 154 lb (69.854 kg)  SpO2 97%  Body mass index is 27.29 kg/(m^2).  GENERAL: vitals reviewed and listed above, alert, oriented, appears well hydrated and in no acute distress anxious as normal nl speech and affect otherwise  HEENT: atraumatic, conjunctiva  clear, no obvious abnormalities on inspection of external nose and ears NECK: no obvious masses on inspection palpation  LUNGS: clear to auscultation bilaterally, no wheezes, rales or rhonchi, good air movement CV: HRRR, no clubbing cyanosis or  peripheral edema nl cap refill  MS: moves all extremities without noticeable focal  abnormality PSYCH: pleasant and cooperative,  No obv depression  No tremor   ASSESSMENT AND PLAN:  Discussed the following assessment and plan:  Anxiety state, unspecified - Plan: lisinopril-hydrochlorothiazide (PRINZIDE,ZESTORETIC) 20-12.5 MG per tablet  High risk medication use - Plan: lisinopril-hydrochlorothiazide  (PRINZIDE,ZESTORETIC) 20-12.5 MG per tablet  Medication management - tox screen ok last time will check at next rx   HYPERTENSION, BENIGN - at this time remain on meds  contrlled  - Plan: lisinopril-hydrochlorothiazide (PRINZIDE,ZESTORETIC) 20-12.5 MG per tablet  VITAMIN D DEFICIENCY - Plan: lisinopril-hydrochlorothiazide (PRINZIDE,ZESTORETIC) 20-12.5 MG per tablet  2 tox screen neg xanax .last one postitive . ( seh says doesnst take to drive but husb drives cause of one car) I she reports this as having taken the medicine but lost some down the sink but she has been very careful to not lose medicine at this time  Pleads  for 10 more butabarbital Fioricet and then she will not ask for any more prescriptions( after I told her I don't want her to take this medicine.) Apparent minor side effects of the BuSpar did not get it refilled didn't see a real affect at the low dose 7.5 twice a day. This was a low dose would suggest recent tried again and increase the dosing to get to at least 30 mg. Per day. I suspect the nausea is the adjustment reaction that should get better with time. She is willing to restart we reviewed medication Refilled the Xanax x1 month Followup in about one month plan tox screen at that time. Reviewed her record also regarding vitamins have her take her vitamin D equivalent of 2000 and day but would add and make sure she is taking B. vitamins as her MMA was slightly elevated a while back with macrocytosis. We'll plan on getting a B12 and vitamin D level with her next blood draw which would probably be in the summer.  -Patient advised to return or notify health care team  if symptoms worsen ,persist or new concerns arise.  Patient Instructions   Your blood pressure   Is good now if want to try off  Stay off 2- 3 weeks before visit and we can check in office.    i dont want you to take the  Fioricet .  For the reasons said.  I would like you to start  The buspirone again 7.5  twice a  A day.    For 2 weeks and then increase to   Take vit d  2000 iu equivalent.   And a multivitamin  With b vitamins in it.  Add calcium  And document how much it is   Day goal is 1200 mg per day diet and vitamins., Plan to check levels at next blood tests.    Neta MendsWanda K. Laikyn Gewirtz M.D. Total visit 45mins > 50% spent counseling and coordinating care

## 2014-02-04 NOTE — Patient Instructions (Addendum)
  Your blood pressure   Is good now if want to try off  Stay off 2- 3 weeks before visit and we can check in office.    i dont want you to take the  Fioricet .  For the reasons said.  I would like you to start  The buspirone again 7.5 twice a  A day.    For 2 weeks and then increase to   Take vit d  2000 iu equivalent.   And a multivitamin  With b vitamins in it.  Add calcium  And document how much it is   Day goal is 1200 mg per day diet and vitamins., Plan to check levels at next blood tests.

## 2014-02-04 NOTE — Progress Notes (Signed)
Pre visit review using our clinic review tool, if applicable. No additional management support is needed unless otherwise documented below in the visit note. 

## 2014-02-13 ENCOUNTER — Encounter: Payer: Self-pay | Admitting: Internal Medicine

## 2014-02-28 ENCOUNTER — Ambulatory Visit (INDEPENDENT_AMBULATORY_CARE_PROVIDER_SITE_OTHER): Payer: BC Managed Care – PPO | Admitting: Internal Medicine

## 2014-02-28 ENCOUNTER — Encounter: Payer: Self-pay | Admitting: Internal Medicine

## 2014-02-28 VITALS — BP 142/78 | Temp 98.0°F | Ht 63.0 in | Wt 151.0 lb

## 2014-02-28 DIAGNOSIS — D649 Anemia, unspecified: Secondary | ICD-10-CM

## 2014-02-28 DIAGNOSIS — I10 Essential (primary) hypertension: Secondary | ICD-10-CM

## 2014-02-28 DIAGNOSIS — E559 Vitamin D deficiency, unspecified: Secondary | ICD-10-CM

## 2014-02-28 DIAGNOSIS — F411 Generalized anxiety disorder: Secondary | ICD-10-CM

## 2014-02-28 DIAGNOSIS — D7589 Other specified diseases of blood and blood-forming organs: Secondary | ICD-10-CM

## 2014-02-28 DIAGNOSIS — Z79899 Other long term (current) drug therapy: Secondary | ICD-10-CM

## 2014-02-28 HISTORY — DX: Anemia, unspecified: D64.9

## 2014-02-28 LAB — CBC WITH DIFFERENTIAL/PLATELET
Basophils Absolute: 0 10*3/uL (ref 0.0–0.1)
Basophils Relative: 0.4 % (ref 0.0–3.0)
EOS ABS: 0 10*3/uL (ref 0.0–0.7)
Eosinophils Relative: 0.5 % (ref 0.0–5.0)
HCT: 35 % — ABNORMAL LOW (ref 36.0–46.0)
HEMOGLOBIN: 11.7 g/dL — AB (ref 12.0–15.0)
Lymphocytes Relative: 18.1 % (ref 12.0–46.0)
Lymphs Abs: 1.3 10*3/uL (ref 0.7–4.0)
MCHC: 33.4 g/dL (ref 30.0–36.0)
MCV: 97.4 fl (ref 78.0–100.0)
MONOS PCT: 4.5 % (ref 3.0–12.0)
Monocytes Absolute: 0.3 10*3/uL (ref 0.1–1.0)
NEUTROS ABS: 5.4 10*3/uL (ref 1.4–7.7)
Neutrophils Relative %: 76.5 % (ref 43.0–77.0)
Platelets: 256 10*3/uL (ref 150.0–400.0)
RBC: 3.59 Mil/uL — AB (ref 3.87–5.11)
RDW: 12.5 % (ref 11.5–15.5)
WBC: 7.1 10*3/uL (ref 4.0–10.5)

## 2014-02-28 LAB — VITAMIN B12: Vitamin B-12: 588 pg/mL (ref 211–911)

## 2014-02-28 LAB — FOLATE: FOLATE: 12.3 ng/mL (ref >5.9)

## 2014-02-28 LAB — VITAMIN D 25 HYDROXY (VIT D DEFICIENCY, FRACTURES): VITD: 15.09 ng/mL

## 2014-02-28 MED ORDER — ALPRAZOLAM 1 MG PO TABS: ORAL_TABLET | ORAL | Status: DC

## 2014-02-28 MED ORDER — BUSPIRONE HCL 15 MG PO TABS: 15.0000 mg | ORAL_TABLET | Freq: Two times a day (BID) | ORAL | Status: DC

## 2014-02-28 MED ORDER — BUTALBITAL-APAP-CAFFEINE 50-325-40 MG PO TABS: ORAL_TABLET | ORAL | Status: DC

## 2014-02-28 NOTE — Patient Instructions (Addendum)
   Go back on BP medication lisinopril/ hctz  since  seems to help  Control your blood pressure.   Ok to try off vit d  Until level back.  Try 600 mg calcium once a day or every other  Day s.   Continue  15 mg twice a day.   Buspirone. Will re write rx . This will be 1 pill twice a day.   Getting B12 level.  Labs related to this.  Today and will let you know results .  Will have someone contact you about tox screen results.   ROV in 1-2 months  .  I want you to get off the butabarbital. As we discussed  .  Bring back  bottle at next visit and have some pills left in their

## 2014-02-28 NOTE — Progress Notes (Signed)
Chief Complaint  Patient presents with  . Follow-up    HPI: FU med management  Inc mcv and vit d  BP  Anxiety:  Bp:  Went back on med  A week ago.  But off and on she doesn't take her readings because of anxiety. Had no side effects when she was taking it asked again if she should stay on it  Vit d ;not taking since last time.   Xanax 4 x per day  Took one this am  Only   fioricet  ocass use  Really wants to take this medicine . States it makes her feel better isn't taking very much only had 10 from last time asked for 10 more will try not to take it all  Taking buspar.   And got confused.   Now up to  2 bid and ran out.restarted and is on one day of a higher dose.  ROS: See pertinent positives and negatives per HPI. Chest pain shortness of breath new symptoms. Has questions about how these medicines were continued be taken together. Additional family history Sis. has severe anemia at some point needs transfusions some of that may have been B12 she lives out of town doesn't go in for followup checks and she worries about her. Patient reports a history of very low B12 levels so low fatthey gave her injections and then switched over to oral medication. And then was better. She had no significant GI illness or diagnosis of pernicious anemia at that time. No etoh or gi illness at this time   Past Medical History  Diagnosis Date  . Eczema   . Headache(784.0)   . Hypertension     Family History  Problem Relation Age of Onset  . Coronary artery disease Father   . Heart disease Father   . COPD Mother   . Anemia Sister     needing transfusion some> vit b12 ?    History   Social History  . Marital Status: Married    Spouse Name: N/A    Number of Children: N/A  . Years of Education: N/A   Social History Main Topics  . Smoking status: Never Smoker   . Smokeless tobacco: Never Used  . Alcohol Use: 0.6 oz/week    1 Glasses of wine per week  . Drug Use: No  . Sexual  Activity: Yes   Other Topics Concern  . 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  Has smoke detector and wears seat belts.  No firearms. No excess sun exposure. Sees dentist regularly . No depression      Only 1 car at this time   No etoh             Outpatient Encounter Prescriptions as of 02/28/2014  Medication Sig  . ALPRAZolam (XANAX) 1 MG tablet One tablet by mouth 4 times a day as needed.  . butalbital-acetaminophen-caffeine (FIORICET, ESGIC) 50-325-40 MG per tablet Take 1-2 tabs  as needed   And wean as directed.  Marland Kitchen. lisinopril-hydrochlorothiazide (PRINZIDE,ZESTORETIC) 20-12.5 MG per tablet Take 1 tablet by mouth daily. For hypertension  . [DISCONTINUED] ALPRAZolam (XANAX) 1 MG tablet One tablet by mouth 4 times a day as needed.  . [DISCONTINUED] busPIRone (BUSPAR) 7.5 MG tablet Take 1 tablet (7.5 mg total) by mouth 2 (two)  times daily. Increase to 1.5 pills bid after 2 weeks then 2 po bid  .after 1-2 weeks or as directed  . [DISCONTINUED] butalbital-acetaminophen-caffeine (FIORICET, ESGIC) 50-325-40 MG per tablet Take 1-2 tabs  as needed   And wean as directed.  . busPIRone (BUSPAR) 15 MG tablet Take 1 tablet (15 mg total) by mouth 2 (two) times daily.  . Cholecalciferol (VITAMIN D3) 2000 UNITS TABS Take 1 tablet by mouth daily.    EXAM:  BP 142/78  Temp(Src) 98 F (36.7 C) (Oral)  Ht 5\' 3"  (1.6 m)  Wt 151 lb (68.493 kg)  BMI 26.76 kg/m2  Body mass index is 26.76 kg/(m^2).  GENERAL: vitals reviewed and listed above, alert, oriented, appears well hydrated and in no acute distress mild facial erythema looks well midly anxious  No tremor HEENT: atraumatic, conjunctiva  clear, no obvious abnormalities on inspection of external nose and ears OP : no lesion edema or exudate  NECK: no obvious masses on inspection palpation   MS: moves all extremities without  noticeable focal  abnormality PSYCH: pleasant and cooperative,  Anxiety a bit more relaxed   ASSESSMENT AND PLAN:  Discussed the following assessment and plan:  Anxiety state, unspecified - Plan: CBC with Differential, Vit D  25 hydroxy (rtn osteoporosis monitoring), Vitamin B12, Methylmalonic Acid, Serum, Folate  Unspecified vitamin D deficiency - check level off supplement today - Plan: CBC with Differential, Vit D  25 hydroxy (rtn osteoporosis monitoring), Vitamin B12, Methylmalonic Acid, Serum, Folate  Mild anemia - Plan: CBC with Differential, Vit D  25 hydroxy (rtn osteoporosis monitoring), Vitamin B12, Methylmalonic Acid, Serum, Folate  Macrocytosis - remote hx of b12 defic and ? sis no etoh will check levels today  - Plan: CBC with Differential, Vit D  25 hydroxy (rtn osteoporosis monitoring), Vitamin B12, Methylmalonic Acid, Serum, Folate  Medication management - tox screen today  contact patient about results - Plan: CBC with Differential, Vit D  25 hydroxy (rtn osteoporosis monitoring), Vitamin B12, Methylmalonic Acid, Serum, Folate  Essential hypertension, benign - trying off med  bp up some today restart - Plan: CBC with Differential, Vit D  25 hydroxy (rtn osteoporosis monitoring), Vitamin B12, Methylmalonic Acid, Serum, Folate Overall I believe she is doing a bit better tox screen today. We discussed the Fioricet again she had 10 pills over the last month and had many more on a regular basis over years. Was told in the remote past that was just like taking and fell and not a serious medication. I reviewed with her the current status of only using it as an emergency rescue medicine and some situations an otherwise more risk than benefit in her situation. Goal will be to bring back her bottle and has a few left in the week she comes back for her followup scan higher dose of BuSpar that she just started -Patient advised to return or notify health care team  if symptoms worsen ,persist  or new concerns arise.  Patient Instructions   Go back on BP medication lisinopril/ hctz  since  seems to help  Control your blood pressure.   Ok to try off vit d  Until level back.  Try 600 mg calcium once a day or every other  Day s.   Continue  15 mg twice a day.   Buspirone. Will re write rx . This will be 1 pill twice a day.   Getting B12 level.  Labs related to this.  Today and will let  you know results .  Will have someone contact you about tox screen results.   ROV in 1-2 months  .  I want you to get off the butabarbital. As we discussed  .  Bring back  bottle at next visit and have some pills left in their    [place] K. Krissia Schreier M.D.  Pre visit review using our clinic review tool, if applicable. No additional management support is needed unless otherwise documented below in the visit note.

## 2014-03-03 LAB — METHYLMALONIC ACID, SERUM: Methylmalonic Acid, Quant: 135 nmol/L (ref 87–318)

## 2014-03-07 ENCOUNTER — Ambulatory Visit: Payer: BC Managed Care – PPO | Admitting: Internal Medicine

## 2014-03-18 ENCOUNTER — Telehealth: Payer: Self-pay | Admitting: Family Medicine

## 2014-03-18 NOTE — Telephone Encounter (Signed)
Pt would like a refill of her butalbital-acetaminophen-caffeine (FIORICET, ESGIC) 50-325-40 MG per tablet.  Stated she took her last pill yesterday. Would also like the results of her lab work.  Please advise.  Thanks!

## 2014-03-21 NOTE — Telephone Encounter (Signed)
Left a message.  Will try again at a later time. 

## 2014-03-21 NOTE — Telephone Encounter (Signed)
See result note to tell her about her labs .  Also the  tox screen shows minimal to no alprazolam again . Ask  why she thinks this is?  Document she is taking  the buspar as directed up to 15 bid   At our last visit we  Planned on not using all of  The fioricet  Denied until next visit

## 2014-03-25 NOTE — Telephone Encounter (Signed)
Left a message for the pt to return my call.  

## 2014-03-27 ENCOUNTER — Encounter: Payer: Self-pay | Admitting: Family Medicine

## 2014-03-27 NOTE — Telephone Encounter (Signed)
Left a message for the pt to return my call.  Will now send a letter.

## 2014-04-08 ENCOUNTER — Encounter: Payer: Self-pay | Admitting: Internal Medicine

## 2014-04-16 ENCOUNTER — Ambulatory Visit (INDEPENDENT_AMBULATORY_CARE_PROVIDER_SITE_OTHER): Payer: BC Managed Care – PPO | Admitting: Internal Medicine

## 2014-04-16 ENCOUNTER — Encounter: Payer: Self-pay | Admitting: Internal Medicine

## 2014-04-16 VITALS — BP 116/62 | HR 78 | Temp 99.1°F | Ht 63.0 in | Wt 151.0 lb

## 2014-04-16 DIAGNOSIS — Z79899 Other long term (current) drug therapy: Secondary | ICD-10-CM

## 2014-04-16 DIAGNOSIS — E785 Hyperlipidemia, unspecified: Secondary | ICD-10-CM

## 2014-04-16 DIAGNOSIS — F411 Generalized anxiety disorder: Secondary | ICD-10-CM

## 2014-04-16 DIAGNOSIS — D649 Anemia, unspecified: Secondary | ICD-10-CM

## 2014-04-16 DIAGNOSIS — E559 Vitamin D deficiency, unspecified: Secondary | ICD-10-CM

## 2014-04-16 DIAGNOSIS — I10 Essential (primary) hypertension: Secondary | ICD-10-CM

## 2014-04-16 MED ORDER — BUSPIRONE HCL 15 MG PO TABS: 15.0000 mg | ORAL_TABLET | Freq: Three times a day (TID) | ORAL | Status: DC

## 2014-04-16 MED ORDER — ALPRAZOLAM 1 MG PO TABS: ORAL_TABLET | ORAL | Status: DC

## 2014-04-16 MED ORDER — BUTALBITAL-APAP-CAFFEINE 50-325-40 MG PO TABS: ORAL_TABLET | ORAL | Status: DC

## 2014-04-16 NOTE — Progress Notes (Signed)
Pre visit review using our clinic review tool, if applicable. No additional management support is needed unless otherwise documented below in the visit note. 

## 2014-04-16 NOTE — Progress Notes (Signed)
Chief Complaint  Patient presents with  . Follow-up    2 mths    HPI: Brooke Pennington  comes in today for follow up of  multiple medical problems.  Medication management   Last visit was to go to buspar 15 bid she has done this isn't sure she feels any different taking her Xanax regularly Xanax and  Be off the fioricet had 10 tabs given on June 5th did asked for more and new shoes but is to bring in the bottle but didn't and forgot asks almost bleeds to have a small amount available at times as she was on this medication 4 times a day for over 10 years maybe 20.Marland Kitchen     Last tox screen neg but  Almost to the cutoff. Of the alprazolam T. scan document The vitamin gnc  womens  ultra mega 50 plus is ok vitamin. For now 1600 mg vitd and 500 of calcium . We started vitamin D as last test was low  ROS: See pertinent positives and negatives per HPI. Had history of menopause at age 54. Remote history of an anal fissure that was very painful and was hospitalized may have had a polyp removed in the past thinks her last colonoscopy was about 10 years ago GI office in [place] cannot remember the name. Uncertain when she is due again. Thinks it was 2007 of her last one. Negative family history of celiac. Past Medical History  Diagnosis Date  . Eczema   . Headache(784.0)   . Hypertension     Family History  Problem Relation Age of Onset  . Coronary artery disease Father   . Heart disease Father   . COPD Mother   . Anemia Sister     needing transfusion some> vit b12 ?    History   Social History  . Marital Status: Married    Spouse Name: N/A    Number of Children: N/A  . Years of Education: N/A   Social History Main Topics  . Smoking status: Never Smoker   . Smokeless tobacco: Never Used  . Alcohol Use: 0.6 oz/week    1 Glasses of wine per week  . Drug Use: No  . Sexual Activity: Yes   Other Topics Concern  . 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  Has smoke detector and wears seat belts.  No firearms. No excess sun exposure. Sees dentist regularly . No depression      Only 1 car at this time   No etoh             Outpatient Encounter Prescriptions as of 04/16/2014  Medication Sig  . ALPRAZolam (XANAX) 1 MG tablet One tablet by mouth 4 times a day as needed.  . busPIRone (BUSPAR) 15 MG tablet Take 1 tablet (15 mg total) by mouth 3 (three) times daily.  . butalbital-acetaminophen-caffeine (FIORICET, ESGIC) 50-325-40 MG per tablet Take 1-2 tabs  as needed   And wean as directed.  . Cholecalciferol (VITAMIN D3) 2000 UNITS TABS Take 1 tablet by mouth daily.  Marland Kitchen lisinopril-hydrochlorothiazide (PRINZIDE,ZESTORETIC) 20-12.5 MG per tablet Take 1 tablet by mouth daily. For hypertension  . [DISCONTINUED] ALPRAZolam (XANAX) 1 MG tablet One tablet by mouth 4 times a day as needed.  . [DISCONTINUED] busPIRone (BUSPAR) 15 MG tablet Take  1 tablet (15 mg total) by mouth 2 (two) times daily.  . [DISCONTINUED] butalbital-acetaminophen-caffeine (FIORICET, ESGIC) 50-325-40 MG per tablet Take 1-2 tabs  as needed   And wean as directed.    EXAM:  BP 116/62  Pulse 78  Temp(Src) 99.1 F (37.3 C) (Oral)  Ht 5\' 3"  (1.6 m)  Wt 151 lb (68.493 kg)  BMI 26.76 kg/m2  Body mass index is 26.76 kg/(m^2).  GENERAL: vitals reviewed and listed above, alert, oriented, appears well hydrated and in no acute distress HEENT: atraumatic, conjunctiva  clear, no obvious abnormalities on inspection of external nose and ears OP : no lesion edema or exudate  NECK: no obvious masses on inspection palpation  LUNGS: clear to auscultation bilaterally, no wheezes, rales or rhonchi, good air movement CV: HRRR, no clubbing cyanosis or  peripheral edema nl cap refill  MS: moves all extremities without noticeable focal  abnormality PSYCH: pleasant and cooperative, no obvious  depression or anxiety Lab Results  Component Value Date   WBC 7.1 02/28/2014   HGB 11.7* 02/28/2014   HCT 35.0* 02/28/2014   PLT 256.0 02/28/2014   GLUCOSE 96 12/25/2012   CHOL 261* 12/25/2012   TRIG 94.0 12/25/2012   HDL 75.10 12/25/2012   LDLDIRECT 168.2 12/25/2012   ALT 19 05/01/2013   AST 20 05/01/2013   NA 138 12/25/2012   K 4.7 12/25/2012   CL 101 12/25/2012   CREATININE 0.9 12/25/2012   BUN 22 12/25/2012   CO2 31 12/25/2012   TSH 1.18 12/25/2012   BP Readings from Last 3 Encounters:  04/16/14 116/62  02/28/14 142/78  02/04/14 130/80   Wt Readings from Last 3 Encounters:  04/16/14 151 lb (68.493 kg)  02/28/14 151 lb (68.493 kg)  02/04/14 154 lb (69.854 kg)    ASSESSMENT AND PLAN:  Discussed the following assessment and plan:  Unspecified vitamin D deficiency - recurred off supplement restart supplement can use the one she has 1600 units of vitamin D a day  Mild anemia - recurred  Anxiety state, unspecified - Chronic and difficult medications for years we'll follow  Medication management  Essential hypertension, benign - Much better back on medicine. Readings on low side but has no symptoms of that at this time continue consider cutting dose in half her lower dosing if needed  Other and unspecified hyperlipidemia  Asks about exercise and it is safe to get back in there with walking and weights she felt better when she was doing these things. I see no reason to limit her activity of course be prudent to avoid injury. She has come a long way with stopping the be tablet call and down to 10 a month although I want her to be off of it all agreed to get her equivalent of 10 a month and no more. Cannot know what to make of the tox screen I believe that she really is taking it but may be a very short half-life because she's been on it so long the urine screen showed almost at the cutoff for positive last time we'll recheck her tox screen when she comes back Increase the BuSpar although I don't know how  effective it is while she is on the Xanax we will monitor. Uncertain why she is continuing to have anemia come and go we haven't documented iron deficiency she is due in a year or so for colonoscopy but doesn't remember the name of the GI Dr. in [place]. They have had a polyp at some  pint Had thought that her medications were contributing but now that she is on a minimal amount of butalbital uncertain cause. B12 and folate were normal although B12 was low in the past , now normal MMA and the last check.  Discussed options we'll get hematology to see her and make an opinion about the anemia and follow.   Plan celiac check at her next lab draw. She is restarting the vitamin D over-the-counter we'll check a level in followup -Patient advised to return or notify health care team  if symptoms worsen ,persist or new concerns arise.  Patient Instructions  If getting dizzy like low blood pressure . Call us so we can either lower the dose or 1/2 th dose . Of bp medicine   The vitamin gnc  womens  ultra mega 50 plus is ok vitamin. For now 1600 mg vitd and 500 of calcium . Keep off of the asa for now. Increase trial of the buspar to 15 mg 3 x per day .  Dont.  know why you have the borderline anemia.  Off an on .   Get updated colonoscopy and evaluation . Get us information to refer to gi  because of the borderline anemia .  Also   Hematology specialist .   Still want limited  butabital  Bring in  Bottle next time.       Neta MendsWanda K. Alfreida Steffenhagen M.D.  Total visit 40mins > 50% spent counseling and coordinating care

## 2014-04-16 NOTE — Patient Instructions (Addendum)
If getting dizzy like low blood pressure . Call us so we can either lower the dose or 1/2 th dose . Of bp medicine   The vitamin gnc  womens  ultra mega 50 plus is ok vitamin. For now 1600 mg vitd and 500 of calcium . Keep off of the asa for now. Increase trial of the buspar to 15 mg 3 x per day .  Dont.  know why you have the borderline anemia.  Off an on .   Get updated colonoscopy and evaluation . Get us information to refer to gi  because of the borderline anemia .  Also   Hematology specialist .   Still want limited  butabital  Bring in  Bottle next time.

## 2014-04-17 ENCOUNTER — Telehealth: Payer: Self-pay | Admitting: Internal Medicine

## 2014-04-17 NOTE — Telephone Encounter (Signed)
Relevant patient education mailed to patient.  

## 2014-04-29 ENCOUNTER — Ambulatory Visit: Payer: BC Managed Care – PPO | Admitting: Internal Medicine

## 2014-05-29 ENCOUNTER — Telehealth: Payer: Self-pay | Admitting: Family Medicine

## 2014-05-29 NOTE — Telephone Encounter (Signed)
Pt would like a refill of her medication.  Asking for 10 pills.  Would also like Dr. Fabian Sharp to know that she will be getting lab work with Dr. Myna Hidalgo on 06/18/14. Please advise.  Thanks!

## 2014-05-30 MED ORDER — BUTALBITAL-APAP-CAFFEINE 50-325-40 MG PO TABS: ORAL_TABLET | ORAL | Status: DC

## 2014-05-30 NOTE — Telephone Encounter (Signed)
Ok to refill x 1  Dispensing 10 only.as per  I received her records  And will try to summarize so it is in the record for dr Myna Hidalgo.

## 2014-05-30 NOTE — Telephone Encounter (Signed)
Called to the pharmacy and left on voicemail.  Left message that her refill has been called to the pharmacy.

## 2014-06-04 ENCOUNTER — Telehealth: Payer: Self-pay | Admitting: Family Medicine

## 2014-06-04 NOTE — Telephone Encounter (Signed)
Pt called and left a message on my machine asking for a return call.

## 2014-06-04 NOTE — Telephone Encounter (Signed)
Pt calling to see if she can have lab appt rescheduled to 9/11 instead of 9/21.  Offer to r/s appt however pt states she needs to know if PCP wants to do a repeat urinalysis at the same time as her labs.

## 2014-06-06 ENCOUNTER — Other Ambulatory Visit (INDEPENDENT_AMBULATORY_CARE_PROVIDER_SITE_OTHER): Payer: BC Managed Care – PPO

## 2014-06-06 DIAGNOSIS — D649 Anemia, unspecified: Secondary | ICD-10-CM

## 2014-06-06 DIAGNOSIS — E559 Vitamin D deficiency, unspecified: Secondary | ICD-10-CM

## 2014-06-06 LAB — CBC WITH DIFFERENTIAL/PLATELET
BASOS ABS: 0 10*3/uL (ref 0.0–0.1)
Basophils Relative: 0.5 % (ref 0.0–3.0)
EOS ABS: 0.1 10*3/uL (ref 0.0–0.7)
Eosinophils Relative: 1.3 % (ref 0.0–5.0)
HEMATOCRIT: 31.7 % — AB (ref 36.0–46.0)
Hemoglobin: 10.7 g/dL — ABNORMAL LOW (ref 12.0–15.0)
LYMPHS ABS: 1.8 10*3/uL (ref 0.7–4.0)
LYMPHS PCT: 36.9 % (ref 12.0–46.0)
MCHC: 33.9 g/dL (ref 30.0–36.0)
MCV: 95 fl (ref 78.0–100.0)
Monocytes Absolute: 0.3 10*3/uL (ref 0.1–1.0)
Monocytes Relative: 6.8 % (ref 3.0–12.0)
NEUTROS ABS: 2.7 10*3/uL (ref 1.4–7.7)
Neutrophils Relative %: 54.5 % (ref 43.0–77.0)
Platelets: 261 10*3/uL (ref 150.0–400.0)
RBC: 3.34 Mil/uL — ABNORMAL LOW (ref 3.87–5.11)
RDW: 12.6 % (ref 11.5–15.5)
WBC: 4.9 10*3/uL (ref 4.0–10.5)

## 2014-06-06 LAB — VITAMIN D 25 HYDROXY (VIT D DEFICIENCY, FRACTURES): VITD: 25.75 ng/mL — AB (ref 30.00–100.00)

## 2014-06-06 NOTE — Telephone Encounter (Signed)
Didn't get to this message   Already did blood tests  .

## 2014-06-09 LAB — GLIA (IGA/G) + TTG IGA
Gliadin IgA: 2.2 U/mL (ref <20)
Gliadin IgG: 4.2 U/mL (ref <20)
Tissue Transglutaminase Ab, IgA: 2.8 U/mL (ref <20)

## 2014-06-16 ENCOUNTER — Telehealth: Payer: Self-pay | Admitting: Internal Medicine

## 2014-06-16 ENCOUNTER — Other Ambulatory Visit: Payer: BC Managed Care – PPO

## 2014-06-16 NOTE — Telephone Encounter (Signed)
Pls advise.  Rx last filled 04/16/14 #120 x 1 RF.

## 2014-06-16 NOTE — Telephone Encounter (Signed)
Pt request refill of ALPRAZolam (XANAX) 1 MG tablet 30 day Pt aware dr Fabian Sharp out but is hoping another dr could refill for her by 9/24 [facility] [place],

## 2014-06-16 NOTE — Telephone Encounter (Signed)
Call in #60 with no rf 

## 2014-06-17 ENCOUNTER — Telehealth: Payer: Self-pay | Admitting: Hematology & Oncology

## 2014-06-17 MED ORDER — ALPRAZOLAM 1 MG PO TABS: ORAL_TABLET | ORAL | Status: DC

## 2014-06-17 NOTE — Telephone Encounter (Signed)
Rx called in to pharmacy. 

## 2014-06-17 NOTE — Telephone Encounter (Signed)
Left vm w NEW PATIENT today to remind them of their appointment with Dr. Ennever. Also, advised them to bring all medication bottles and insurance card information. ° °

## 2014-06-18 ENCOUNTER — Other Ambulatory Visit: Payer: BC Managed Care – PPO | Admitting: Lab

## 2014-06-18 ENCOUNTER — Ambulatory Visit: Payer: BC Managed Care – PPO

## 2014-06-18 ENCOUNTER — Ambulatory Visit: Payer: BC Managed Care – PPO | Admitting: Hematology & Oncology

## 2014-06-23 ENCOUNTER — Ambulatory Visit: Payer: BC Managed Care – PPO | Admitting: Internal Medicine

## 2014-06-24 ENCOUNTER — Telehealth: Payer: Self-pay | Admitting: Family Medicine

## 2014-06-24 NOTE — Telephone Encounter (Signed)
Pt is calling to request a refill of butalbital-acetaminophen-caffeine (FIORICET, ESGIC) 50-325-40 MG per tablet. Please advise.  Thanks!

## 2014-06-25 MED ORDER — BUTALBITAL-APAP-CAFFEINE 50-325-40 MG PO TABS: ORAL_TABLET | ORAL | Status: DC

## 2014-06-25 NOTE — Telephone Encounter (Signed)
Can refill x 1  # 10

## 2014-06-25 NOTE — Telephone Encounter (Signed)
Called to the pharmacy and left on voicemail. 

## 2014-07-01 ENCOUNTER — Encounter: Payer: Self-pay | Admitting: Internal Medicine

## 2014-07-01 ENCOUNTER — Ambulatory Visit (INDEPENDENT_AMBULATORY_CARE_PROVIDER_SITE_OTHER): Payer: BC Managed Care – PPO | Admitting: Internal Medicine

## 2014-07-01 VITALS — BP 134/72 | Temp 98.4°F | Ht 63.0 in | Wt 150.0 lb

## 2014-07-01 DIAGNOSIS — E559 Vitamin D deficiency, unspecified: Secondary | ICD-10-CM

## 2014-07-01 DIAGNOSIS — F411 Generalized anxiety disorder: Secondary | ICD-10-CM

## 2014-07-01 DIAGNOSIS — Z79899 Other long term (current) drug therapy: Secondary | ICD-10-CM

## 2014-07-01 DIAGNOSIS — D649 Anemia, unspecified: Secondary | ICD-10-CM

## 2014-07-01 DIAGNOSIS — I10 Essential (primary) hypertension: Secondary | ICD-10-CM

## 2014-07-01 DIAGNOSIS — Z2821 Immunization not carried out because of patient refusal: Secondary | ICD-10-CM

## 2014-07-01 MED ORDER — ALPRAZOLAM 1 MG PO TABS: ORAL_TABLET | ORAL | Status: DC

## 2014-07-01 MED ORDER — BUTALBITAL-APAP-CAFFEINE 50-325-40 MG PO TABS: ORAL_TABLET | ORAL | Status: DC

## 2014-07-01 NOTE — Assessment & Plan Note (Signed)
benzos and butab  Sept 15

## 2014-07-01 NOTE — Patient Instructions (Addendum)
  Continue the buspar .    Take 2000 iu per day  . Vit D at this time  contiune BP medication . Keep  appt with Dr.  Myna HidalgoEnnever .  Will also  Get referral to GI  .

## 2014-07-01 NOTE — Assessment & Plan Note (Signed)
Take 200 IU every day  Disc  Make it simple to help adherance

## 2014-07-01 NOTE — Progress Notes (Signed)
Pre visit review using our clinic review tool, if applicable. No additional management support is needed unless otherwise documented below in the visit note.  Chief Complaint  Patient presents with  . Follow-up    HPI: Brooke Pennington  comes in today for follow up of  multiple medical problems.   Meds: anxiety need refill   Taking 4 x per day . Thinks the BuSpar is helping although it takes a while she states she is taking this regularly fioricet  we agreed that she should have no more than 10 a month however if something stressful happened recently and she had a real headache and took a few she bring her bottle and in she has 1 left. Asks for extra this month. Vit D  taking the [employer] vitamins but isn't really taking it every day missing a few days a week. Anemia has appointment with Dr. Myna HidalgoEnnever t the end of the month has some apprehension about this because of that the cancer center no active bleeding we did get her records from Dr. Tresa EndoKelly PCP from 2007- 2010. She did have a colonoscopy endoscopy biopsy was negative for celiac positive for mild gastritis lymphocytic no ulcer had hemorrhoids this was in about 2009 Hypertension she's back on the medication seems to do well.  ROS: See pertinent positives and negatives per HPI. No current chest pain shortness of breath.   Past Medical History  Diagnosis Date  . Eczema   . Headache(784.0)   . Hypertension   . Menopause     age 63 with sx no hrt  . Anal fissure     hx of same  . H/O rectal polypectomy     ?  uncertain  if colonic or rectal colonsocopy 8 2007 [facility] [place]  . Lymphocytic gastritis     neg SBBX for celiac  in 8 2007   . PMR (polymyalgia rheumatica)     Possible diagnoses by primary care physician with her anemia and elevated sedimentation rate of 60 was on prednisone but stopped it herself and then felt fine    Family History  Problem Relation Age of Onset  . Coronary artery disease Father   . Heart disease Father   .  COPD Mother   . Anemia Sister     needing transfusion some> vit b12 ?    History   Social History  . Marital Status: Married    Spouse Name: N/A    Number of Children: N/A  . Years of Education: N/A   Social History Main Topics  . Smoking status: Never Smoker   . Smokeless tobacco: Never Used  . Alcohol Use: 0.6 oz/week    1 Glasses of wine per week  . Drug Use: No  . Sexual Activity: Yes   Other Topics Concern  . 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  Has smoke detector and wears seat belts.  No firearms. No excess sun exposure. Sees dentist regularly . No depression      Only 1 car at this time   No etoh             Outpatient Encounter Prescriptions as of 07/01/2014  Medication Sig  . ALPRAZolam (XANAX) 1 MG tablet One tablet by mouth 4 times a day as needed.  . busPIRone (BUSPAR) 15 MG  tablet Take 1 tablet (15 mg total) by mouth 3 (three) times daily.  . butalbital-acetaminophen-caffeine (FIORICET, ESGIC) 50-325-40 MG per tablet Take 1-2 tabs  as needed   And wean as directed.  . Cholecalciferol (VITAMIN D3) 2000 UNITS TABS Take 1 tablet by mouth daily.  Marland Kitchen lisinopril-hydrochlorothiazide (PRINZIDE,ZESTORETIC) 20-12.5 MG per tablet Take 1 tablet by mouth daily. For hypertension  . [DISCONTINUED] ALPRAZolam (XANAX) 1 MG tablet One tablet by mouth 4 times a day as needed.  . [DISCONTINUED] butalbital-acetaminophen-caffeine (FIORICET, ESGIC) 50-325-40 MG per tablet Take 1-2 tabs  as needed   And wean as directed.    EXAM:  BP 134/72  Temp(Src) 98.4 F (36.9 C) (Oral)  Ht 5\' 3"  (1.6 m)  Wt 150 lb (68.04 kg)  BMI 26.58 kg/m2  Body mass index is 26.58 kg/(m^2).  GENERAL: vitals reviewed and listed above, alert, oriented, appears well hydrated and in no acute distress HEENT: atraumatic, conjunctiva  clear, no obvious abnormalities on inspection of  external nose and ears NECK: no obvious masses on inspection palpation  MS: moves all extremities without noticeable focal  abnormality PSYCH: pleasant and cooperative, no obvious depression  Anxious  At baseline  Good eye contact  Tearful when discussing stress Lab Results  Component Value Date   WBC 4.9 06/06/2014   HGB 10.7* 06/06/2014   HCT 31.7* 06/06/2014   PLT 261.0 06/06/2014   GLUCOSE 96 12/25/2012   CHOL 261* 12/25/2012   TRIG 94.0 12/25/2012   HDL 75.10 12/25/2012   LDLDIRECT 168.2 12/25/2012   ALT 19 05/01/2013   AST 20 05/01/2013   NA 138 12/25/2012   K 4.7 12/25/2012   CL 101 12/25/2012   CREATININE 0.9 12/25/2012   BUN 22 12/25/2012   CO2 31 12/25/2012   TSH 1.18 12/25/2012   See vit d level tox screen  Pos for benzos and  butabital ASSESSMENT AND PLAN:  Discussed the following assessment and plan:  Essential hypertension, benign  Anemia, unspecified anemia type - appt with dr E hx of same in past  - Plan: Ambulatory referral to Gastroenterology  Influenza vaccination declined  Medication management - We'll refill her butalbital one time early she should not be taking this on a regular basis  Anxiety state  Vitamin D deficiency  -Patient advised to return or notify health care team  if symptoms worsen ,persist or new concerns arise.  Patient Instructions   Continue the buspar .    Take 2000 iu per day  . Vit D at this time  contiune BP medication . Keep  appt with Dr.  Myna Hidalgo .  Will also  Get referral to GI  .    Neta Mends. Panosh M.D.  Review of records from 2007-2010 Dr. Nicholaus Bloom showed workup for anemia had upper endoscopy colonoscopy negative for celiac antral gastritis lymphocytic. Also had hemorrhoids. There was also a diagnosis of PMR diagnosed by symptoms elevated sed rate of 60 and was treated with prednisone. Hemoglobin ranged from 14-10. There was one vitamin B12 level that was in the 200s.  Also noted she had been on Prozac and Celexa in the past for mood. There is  no mention of Xanax and only one mention of Fioricet. They did not prescribe these medicines.x one time   Patient statedDr. Scotty Court was her main doctor that she had known for years but went there for some problem based because it was closer to her home. She did have blood pressure hypertension treated with lisinopril at some  point.

## 2014-07-18 ENCOUNTER — Telehealth: Payer: Self-pay | Admitting: *Deleted

## 2014-07-18 ENCOUNTER — Telehealth: Payer: Self-pay | Admitting: Hematology & Oncology

## 2014-07-18 NOTE — Telephone Encounter (Signed)
Patient is calling because she would like a refill of her Xanax around November 3rd.  She would like to know if she needs a drug test prior to refill and if so if she can come in on Tuesday 27th?

## 2014-07-18 NOTE — Telephone Encounter (Signed)
Left vm w NEW PATIENT today to remind them of their appointment with Dr. Ennever. Also, advised them to bring all medication bottles and insurance card information. ° °

## 2014-07-21 ENCOUNTER — Ambulatory Visit: Payer: BC Managed Care – PPO

## 2014-07-21 ENCOUNTER — Other Ambulatory Visit (HOSPITAL_BASED_OUTPATIENT_CLINIC_OR_DEPARTMENT_OTHER): Payer: BC Managed Care – PPO | Admitting: Lab

## 2014-07-21 ENCOUNTER — Encounter: Payer: Self-pay | Admitting: Hematology & Oncology

## 2014-07-21 ENCOUNTER — Ambulatory Visit (HOSPITAL_BASED_OUTPATIENT_CLINIC_OR_DEPARTMENT_OTHER): Payer: BC Managed Care – PPO | Admitting: Hematology & Oncology

## 2014-07-21 VITALS — BP 129/57 | HR 78 | Temp 97.9°F | Resp 14 | Ht 63.0 in | Wt 154.0 lb

## 2014-07-21 DIAGNOSIS — D508 Other iron deficiency anemias: Secondary | ICD-10-CM

## 2014-07-21 LAB — CBC WITH DIFFERENTIAL (CANCER CENTER ONLY)
BASO#: 0 10*3/uL (ref 0.0–0.2)
BASO%: 0.3 % (ref 0.0–2.0)
EOS%: 1.6 % (ref 0.0–7.0)
Eosinophils Absolute: 0.1 10*3/uL (ref 0.0–0.5)
HEMATOCRIT: 30.4 % — AB (ref 34.8–46.6)
HGB: 10.1 g/dL — ABNORMAL LOW (ref 11.6–15.9)
LYMPH#: 1.7 10*3/uL (ref 0.9–3.3)
LYMPH%: 24.6 % (ref 14.0–48.0)
MCH: 32.2 pg (ref 26.0–34.0)
MCHC: 33.2 g/dL (ref 32.0–36.0)
MCV: 97 fL (ref 81–101)
MONO#: 0.5 10*3/uL (ref 0.1–0.9)
MONO%: 6.8 % (ref 0.0–13.0)
NEUT#: 4.5 10*3/uL (ref 1.5–6.5)
NEUT%: 66.7 % (ref 39.6–80.0)
PLATELETS: 251 10*3/uL (ref 145–400)
RBC: 3.14 10*6/uL — ABNORMAL LOW (ref 3.70–5.32)
RDW: 11.9 % (ref 11.1–15.7)
WBC: 6.8 10*3/uL (ref 3.9–10.0)

## 2014-07-21 LAB — CHCC SATELLITE - SMEAR

## 2014-07-21 LAB — RETICULOCYTES (CHCC)
ABS Retic: 35.1 10*3/uL (ref 19.0–186.0)
RBC.: 3.19 MIL/uL — AB (ref 3.87–5.11)
Retic Ct Pct: 1.1 % (ref 0.4–2.3)

## 2014-07-21 NOTE — Progress Notes (Signed)
Referral MD  Reason for Referral: Normochromic and normocytic anemia   Chief Complaint  Patient presents with  . NEW PATIENT  : My red blood cells keep going down.  HPI: Brooke Pennington is a very charming 63 year old white female. She has been in good health. She has been noted to have progressive anemia. She never has been transfused.    She has had a colonoscopy and upper endoscopy about 8 years ago. There may have been a polyp in the colon. She was checked for celiac disease but biopsies were negative.   She's not noted any obvious bleeding. There's been no melena or bright blood per rectum. She's had no hemoptysis. There's been no hematemesis.   She's had no leg swelling. There's been no rashes. She's had no weight loss or weight gain. She's not had any new medications given to her.  Blood work that has been done on her back in April of 2014 showed a hemoglobin of 11.8 with a hematocrit of 34.4. In August of 2014, her hemoglobin was 14.1. In June of this year, her white cell count was 7.1. Hemoglobin 11.7. Platelet count was 256. Her MCV was 97.  Her vitamin B12 level has been checked which was 588 back in June. In August of 2014, her iron was 148 with a saturation of 38%.  Her renal function has been okay.'  She's been referred to the [facility] for an evaluation.      Past Medical History  Diagnosis Date  . Eczema   . Headache(784.0)   . Hypertension   . Menopause     age 22 with sx no hrt  . Anal fissure     hx of same  . H/O rectal polypectomy     ?  uncertain  if colonic or rectal colonsocopy 8 2007 gi [facility]  . Lymphocytic gastritis     neg SBBX for celiac  in 8 2007   . PMR (polymyalgia rheumatica)     Possible diagnoses by primary care physician with her anemia and elevated sedimentation rate of 60 was on prednisone but stopped it herself and then felt fine  :  Past Surgical History  Procedure Laterality Date  . No past surgeries    :  Current  outpatient prescriptions:ALPRAZolam (XANAX) 1 MG tablet, One tablet by mouth 4 times a day as needed., Disp: 120 tablet, Rfl: 0;  busPIRone (BUSPAR) 15 MG tablet, Take 1 tablet (15 mg total) by mouth 3 (three) times daily., Disp: 90 tablet, Rfl: 4;  butalbital-acetaminophen-caffeine (FIORICET, ESGIC) 50-325-40 MG per tablet, Take 1-2 tabs  as needed   And wean as directed., Disp: 10 tablet, Rfl: 0 Cholecalciferol (VITAMIN D3) 2000 UNITS TABS, Take 1 tablet by mouth daily., Disp: , Rfl: ;  hydrOXYzine (ATARAX/VISTARIL) 25 MG tablet, Take 25 mg by mouth as needed., Disp: , Rfl: ;  lisinopril-hydrochlorothiazide (PRINZIDE,ZESTORETIC) 20-12.5 MG per tablet, Take 1 tablet by mouth daily. For hypertension, Disp: 90 tablet, Rfl: 3;  Loratadine 10 MG CAPS, Take by mouth as needed., Disp: , Rfl: :  :  No Known Allergies:  Family History  Problem Relation Age of Onset  . Coronary artery disease Father   . Heart disease Father   . COPD Mother   . Anemia Sister     needing transfusion some> vit b12 ?  :  History   Social History  . Marital Status: Married    Spouse Name: N/A    Number of Children: N/A  .  Years of Education: N/A   Occupational History  . Not on file.   Social History Main Topics  . Smoking status: Never Smoker   . Smokeless tobacco: Never Used     Comment: never used tobacco  . Alcohol Use: 0.6 oz/week    1 Glasses of wine per week  . Drug Use: No  . Sexual Activity: Yes   Other Topics Concern  . 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  Has smoke detector and wears seat belts.  No firearms. No excess sun exposure. Sees dentist regularly . No depression      Only 1 car at this time   No etoh           :  Pertinent items are noted in HPI.  Exam: '@IPVITALS' @  well-developed and well-nourished white female in no obvious distress. Vital signs  show a temperature of 97.9. Blood pressure 129/57. Pulse 78. Weight 154 pounds. Head and neck exam shows no ocular or oral lesions. She has no palpable cervical or supraclavicular lymph nodes. Thyroid is nonpalpable. Lungs are clear. There are no rales, wheezes or rhonchi. Cardiac exam regular rate rhythm with a normal S1 and S2. There are no murmurs, rubs or bruits. Abdomen is soft. She has good bowel sounds. There is no fluid wave. There is no palpable liver or spleen tip. Axillary exam shows no bilateral axillary adenopathy. Back exam shows no tenderness over the spine, ribs or hips. Extremities shows no clubbing, cyanosis or edema. Skin exam no rashes, ecchymoses or petechia. Neurological exam is nonfocal.    Recent Labs  07/21/14 1459  WBC 6.8  HGB 10.1*  HCT 30.4*  PLT 251   No results found for this basename: NA, K, CL, CO2, GLUCOSE, BUN, CREATININE, CALCIUM,  in the last 72 hours  Blood smear review: Normochromic and normocytic red blood cells. She has a few microcytic red cells. She has no hypochromic red cells. There is no rouleau formation. She has no nucleated red blood cells. There are no teardrop cells. She has no schistocytes or spherocytes. White cells per normal in morphology and maturation. There are no hypersegmented polys. She has no immature myeloid or lymphoid forms. There are no blasts. Platelets are adequate number and size. She may have a couple large platelets.  Pathology: None     Assessment and Plan: Brooke Pennington is a very charming 62 year old white female. She has a mild anemia. Her blood smear is pretty much unremarkable. She has not had a guaiac test of her stool. I gave her a guaiac kit to do.  It is hard to say why the anemia is present. I suppose iron deficiency is always a possibility. The blood smear certainly was not too suggestive of this.  We will have to see what her reticulocyte count is. This is to help [place].  I would not think that she has erythropoietin  deficiency. She has normal renal function.  She is young for myelodysplasia. However, this is one possibility that we may have to entertain. The only way to evaluate this would be with a bone marrow test.  I spoke to her and her husband for about an hour. I reviewed the lab work with them. I explained the different possibilities that could be present. I answered all their questions.  I will plan to get her back to see [place] once we have all result in.  I gave her a prayer blanket. She was very appreciative of this.

## 2014-07-21 NOTE — Telephone Encounter (Signed)
Called and left the pt a message on home/cell advising she call and make an appt for urine screen and that she call back when needed for medication refills.  Will be unable to remember that she needs a refill on Nov. 3rd.

## 2014-07-22 ENCOUNTER — Other Ambulatory Visit: Payer: Self-pay | Admitting: Family Medicine

## 2014-07-22 ENCOUNTER — Other Ambulatory Visit: Payer: BC Managed Care – PPO

## 2014-07-22 LAB — IRON AND TIBC CHCC
%SAT: 28 % (ref 21–57)
IRON: 80 ug/dL (ref 41–142)
TIBC: 285 ug/dL (ref 236–444)
UIBC: 205 ug/dL (ref 120–384)

## 2014-07-22 LAB — FERRITIN CHCC: FERRITIN: 60 ng/mL (ref 9–269)

## 2014-07-22 MED ORDER — BUTALBITAL-APAP-CAFFEINE 50-325-40 MG PO TABS: ORAL_TABLET | ORAL | Status: DC

## 2014-07-22 MED ORDER — ALPRAZOLAM 1 MG PO TABS: ORAL_TABLET | ORAL | Status: DC

## 2014-07-22 NOTE — Telephone Encounter (Signed)
Pt is requesting refills. Please advise. Thanks! 

## 2014-07-22 NOTE — Telephone Encounter (Signed)
Called to the pharmacy and given to pharmacist by phone.

## 2014-07-23 ENCOUNTER — Telehealth: Payer: Self-pay | Admitting: Hematology & Oncology

## 2014-07-23 NOTE — Addendum Note (Signed)
Addended by: Arlan OrganENNEVER, PETER R on: 07/23/2014 03:56 PM   Modules accepted: Orders

## 2014-07-23 NOTE — Telephone Encounter (Signed)
Left message to call for appointment next week.

## 2014-07-24 ENCOUNTER — Telehealth: Payer: Self-pay | Admitting: Hematology & Oncology

## 2014-07-24 NOTE — Telephone Encounter (Signed)
Left message to call for appointment next week.

## 2014-07-30 ENCOUNTER — Other Ambulatory Visit: Payer: Self-pay | Admitting: *Deleted

## 2014-07-30 ENCOUNTER — Other Ambulatory Visit (HOSPITAL_BASED_OUTPATIENT_CLINIC_OR_DEPARTMENT_OTHER): Payer: BC Managed Care – PPO | Admitting: Lab

## 2014-07-30 ENCOUNTER — Ambulatory Visit (HOSPITAL_BASED_OUTPATIENT_CLINIC_OR_DEPARTMENT_OTHER): Payer: BC Managed Care – PPO

## 2014-07-30 VITALS — BP 110/61 | HR 78 | Temp 98.0°F | Resp 20

## 2014-07-30 DIAGNOSIS — D649 Anemia, unspecified: Secondary | ICD-10-CM

## 2014-07-30 DIAGNOSIS — D509 Iron deficiency anemia, unspecified: Secondary | ICD-10-CM

## 2014-07-30 LAB — FECAL OCCULT BLOOD, GUIAC - CHCC SATELLITE: Occult Blood: NEGATIVE

## 2014-07-30 MED ORDER — SODIUM CHLORIDE 0.9 % IV SOLN
Freq: Once | INTRAVENOUS | Status: AC
  Administered 2014-07-30: 14:00:00 via INTRAVENOUS

## 2014-07-30 MED ORDER — SODIUM CHLORIDE 0.9 % IV SOLN
1020.0000 mg | Freq: Once | INTRAVENOUS | Status: AC
  Administered 2014-07-30: 1020 mg via INTRAVENOUS
  Filled 2014-07-30: qty 34

## 2014-07-30 NOTE — Patient Instructions (Signed)

## 2014-07-31 ENCOUNTER — Telehealth: Payer: Self-pay | Admitting: *Deleted

## 2014-07-31 NOTE — Telephone Encounter (Signed)
Told patient that her stool cards were negative for blood.  Patient very happy with this information

## 2014-07-31 NOTE — Telephone Encounter (Signed)
-----   Message from Josph MachoPeter R Ennever, MD sent at 07/30/2014  6:26 PM EST ----- Call and let her know that her stool cards were all negative for blood. Cindee LamePete

## 2014-08-05 ENCOUNTER — Telehealth: Payer: Self-pay | Admitting: Family Medicine

## 2014-08-05 NOTE — Telephone Encounter (Signed)
Pt requesting a refill of butalbital-acetaminophen-caffeine (FIORICET, ESGIC) 50-325-40 MG per tablet.  Also needs to know if she needs to have a urine screen this month.  Please advise.  Thanks!

## 2014-08-06 NOTE — Telephone Encounter (Signed)
Pt following up on request. Pt aware no need for urine screen this month.  Would like to confirm that her ALPRAZolam Prudy Feeler(XANAX) 1 MG tablet will be refilled at end of month. Pls advise.  Would like to get the butalbital-acetaminophen-caffeine (FIORICET, ESGIC) 50-325-40 MG per tablet tomorrow as well [facility] [address] [place]

## 2014-08-06 NOTE — Telephone Encounter (Signed)
No need for urine screen  Last refill 10 6 and then 10 27  To limit to 10 per month . Can refill  X 1 no earlier than 11/20

## 2014-08-07 NOTE — Telephone Encounter (Signed)
LM for the pt to return my call. 

## 2014-08-08 NOTE — Telephone Encounter (Signed)
Pt aware to call pharm for the xanax refill at end of month.  Pt aware no need for urine drug screen this month.  Pt will call on 11/18 for refill on fioricet.

## 2014-08-12 ENCOUNTER — Telehealth: Payer: Self-pay | Admitting: Internal Medicine

## 2014-08-12 NOTE — Telephone Encounter (Signed)
Pt request refill butalbital-acetaminophen-caffeine (FIORICET, ESGIC) 50-325-40 MG per tablet Pt states it can be filled 11/20. [facility] [address] [address] [place]

## 2014-08-15 MED ORDER — BUTALBITAL-APAP-CAFFEINE 50-325-40 MG PO TABS: ORAL_TABLET | ORAL | Status: DC

## 2014-08-15 NOTE — Telephone Encounter (Signed)
Called to the pharmacy and left on voicemail. 

## 2014-08-15 NOTE — Telephone Encounter (Signed)
Last filled on 07/22/14.  Please advise.  Thanks!

## 2014-08-15 NOTE — Telephone Encounter (Signed)
Ok to refill x 1  As per my prev note    Plan is to limit to no more than 10 per month. No refill until 12 20 ( or 12 18 is a Friday )

## 2014-08-15 NOTE — Telephone Encounter (Signed)
Pt following uo on med request, per phone note 11/10.

## 2014-08-15 NOTE — Telephone Encounter (Signed)
Pt following up on refill request. Thanks,

## 2014-08-18 ENCOUNTER — Telehealth: Payer: Self-pay | Admitting: Internal Medicine

## 2014-08-18 ENCOUNTER — Other Ambulatory Visit: Payer: BC Managed Care – PPO | Admitting: Lab

## 2014-08-18 ENCOUNTER — Ambulatory Visit: Payer: BC Managed Care – PPO | Admitting: Hematology & Oncology

## 2014-08-18 NOTE — Telephone Encounter (Signed)
Pt requesting refill of ALPRAZolam (XANAX) 1 MG tablet  [facility] [place] Will be due sat and pt aware we will be closed thurs/fri.

## 2014-08-19 ENCOUNTER — Ambulatory Visit: Payer: BC Managed Care – PPO | Admitting: Hematology & Oncology

## 2014-08-19 ENCOUNTER — Other Ambulatory Visit: Payer: BC Managed Care – PPO | Admitting: Lab

## 2014-08-19 NOTE — Telephone Encounter (Signed)
Ok to refill  X 2

## 2014-08-20 MED ORDER — ALPRAZOLAM 1 MG PO TABS: ORAL_TABLET | ORAL | Status: DC

## 2014-08-20 NOTE — Telephone Encounter (Signed)
Called to the pharmacy and left on voicemail. 

## 2014-08-22 ENCOUNTER — Other Ambulatory Visit: Payer: BC Managed Care – PPO | Admitting: Lab

## 2014-08-22 ENCOUNTER — Ambulatory Visit: Payer: BC Managed Care – PPO | Admitting: Hematology & Oncology

## 2014-08-26 ENCOUNTER — Telehealth: Payer: Self-pay | Admitting: Hematology & Oncology

## 2014-08-26 NOTE — Telephone Encounter (Signed)
Patient called and cx 08/27/14 apt and resch for 09/02/14

## 2014-08-27 ENCOUNTER — Other Ambulatory Visit: Payer: BC Managed Care – PPO | Admitting: Lab

## 2014-08-27 ENCOUNTER — Ambulatory Visit: Payer: BC Managed Care – PPO | Admitting: Hematology & Oncology

## 2014-09-01 ENCOUNTER — Telehealth: Payer: Self-pay | Admitting: Hematology & Oncology

## 2014-09-01 ENCOUNTER — Encounter: Payer: Self-pay | Admitting: Internal Medicine

## 2014-09-01 NOTE — Telephone Encounter (Signed)
Pt cx 12-8 r/s to 12-23

## 2014-09-02 ENCOUNTER — Other Ambulatory Visit: Payer: BC Managed Care – PPO | Admitting: Lab

## 2014-09-02 ENCOUNTER — Ambulatory Visit: Payer: BC Managed Care – PPO | Admitting: Hematology & Oncology

## 2014-09-03 ENCOUNTER — Telehealth: Payer: Self-pay | Admitting: Internal Medicine

## 2014-09-03 MED ORDER — BUSPIRONE HCL 15 MG PO TABS: 15.0000 mg | ORAL_TABLET | Freq: Three times a day (TID) | ORAL | Status: DC

## 2014-09-03 NOTE — Telephone Encounter (Signed)
Buspar refilled for 1 month.  It is too early to refill the Fioricet.  She cannot receive anymore until 09/14/14.  Called and left a message on voicemail to inform the pt of all.

## 2014-09-03 NOTE — Telephone Encounter (Signed)
Pt request refill busPIRone (BUSPAR) 15 MG tablet  And butalbital-acetaminophen-caffeine (FIORICET, ESGIC) 50-325-40 MG per tablet [facility]

## 2014-09-05 ENCOUNTER — Telehealth: Payer: Self-pay | Admitting: Internal Medicine

## 2014-09-05 NOTE — Telephone Encounter (Signed)
Pt request refill butalbital-acetaminophen-caffeine (FIORICET, ESGIC) 50-325-40 MG per tablet Pt knows it is due on the 20th, but that is a Sunday and what is the earliest you could refill for pt?  [facility]

## 2014-09-08 NOTE — Telephone Encounter (Signed)
Ok to refill 10# on  Dec 18th  No refill

## 2014-09-10 NOTE — Telephone Encounter (Signed)
Lm on vm info for pt.

## 2014-09-12 ENCOUNTER — Telehealth: Payer: Self-pay | Admitting: Hematology & Oncology

## 2014-09-12 MED ORDER — BUTALBITAL-APAP-CAFFEINE 50-325-40 MG PO TABS: ORAL_TABLET | ORAL | Status: DC

## 2014-09-12 NOTE — Telephone Encounter (Signed)
Misty, pt is expecting this med today since it is due on Sunday. Dr Fabian SharpPanosh has said ok. Can you call this in?  Thanks!

## 2014-09-12 NOTE — Telephone Encounter (Signed)
Called to the pharmacy and left on machine. 

## 2014-09-12 NOTE — Telephone Encounter (Signed)
Pt moved 12-23 to 1-12

## 2014-09-17 ENCOUNTER — Other Ambulatory Visit: Payer: BC Managed Care – PPO | Admitting: Lab

## 2014-09-17 ENCOUNTER — Ambulatory Visit: Payer: BC Managed Care – PPO | Admitting: Hematology & Oncology

## 2014-09-30 ENCOUNTER — Ambulatory Visit (INDEPENDENT_AMBULATORY_CARE_PROVIDER_SITE_OTHER): Payer: BLUE CROSS/BLUE SHIELD | Admitting: Internal Medicine

## 2014-09-30 ENCOUNTER — Encounter: Payer: Self-pay | Admitting: Internal Medicine

## 2014-09-30 VITALS — BP 128/74 | Temp 98.2°F | Ht 63.0 in | Wt 159.8 lb

## 2014-09-30 DIAGNOSIS — D649 Anemia, unspecified: Secondary | ICD-10-CM

## 2014-09-30 DIAGNOSIS — F411 Generalized anxiety disorder: Secondary | ICD-10-CM

## 2014-09-30 DIAGNOSIS — I10 Essential (primary) hypertension: Secondary | ICD-10-CM

## 2014-09-30 DIAGNOSIS — Z79899 Other long term (current) drug therapy: Secondary | ICD-10-CM

## 2014-09-30 MED ORDER — BUSPIRONE HCL 15 MG PO TABS: 15.0000 mg | ORAL_TABLET | Freq: Three times a day (TID) | ORAL | Status: DC

## 2014-09-30 MED ORDER — ALPRAZOLAM 1 MG PO TABS: ORAL_TABLET | ORAL | Status: DC

## 2014-09-30 MED ORDER — BUTALBITAL-APAP-CAFFEINE 50-325-40 MG PO TABS: ORAL_TABLET | ORAL | Status: DC

## 2014-09-30 NOTE — Progress Notes (Signed)
Pre visit review using our clinic review tool, if applicable. No additional management support is needed unless otherwise documented below in the visit note.  Chief Complaint  Patient presents with  . Follow-up    meds  . Hypertension    HPI: Brooke Pennington comes in for fu meds anxiety mediation ht ..has seen dr e for anemia under evalHad iron infusion.    To go back and follow iup. Father died 10/02/2023.   Age 62  grieving and stressed . Loratidine.  Hx of atarax in past with itching  buspar  Ran out    For at least a week and aware coming in today  No change alprazolam  Asks for more  rx refills  And come back in 3-4 months cause of what is going on .  Emotional .. Tough times.  Just back   In town.     Want to go every 3-4 months .     [place]  2-3  Hour drive.  Had heart disease cabg and heart damage.   OS: See pertinent positives and negatives per HPI.  Past Medical History  Diagnosis Date  . Eczema   . Headache(784.0)   . Hypertension   . Menopause     age 26 with sx no hrt  . Anal fissure     hx of same  . H/O rectal polypectomy     ?  uncertain  if colonic or rectal colonsocopy 8 2007 gi [facility]  . Lymphocytic gastritis     neg SBBX for celiac  in 8 2007   . PMR (polymyalgia rheumatica)     Possible diagnoses by primary care physician with her anemia and elevated sedimentation rate of 60 was on prednisone but stopped it herself and then felt fine    Family History  Problem Relation Age of Onset  . Coronary artery disease Father   . Heart disease Father   . COPD Mother   . Anemia Sister     needing transfusion some> vit b12 ?    History   Social History  . Marital Status: Married    Spouse Name: N/A    Number of Children: N/A  . Years of Education: N/A   Social History Main Topics  . Smoking status: Never Smoker   . Smokeless tobacco: Never Used     Comment: never used tobacco  . Alcohol Use: 0.6 oz/week    1 Glasses of wine per week  . Drug Use:  No  . Sexual Activity: Yes   Other Topics Concern  . 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  Has smoke detector and wears seat belts.  No firearms. No excess sun exposure. Sees dentist regularly . No depression      Only 1 car at this time   No etoh   Father passed January 06, 202515 chf heart related age 79          Outpatient Encounter Prescriptions as of 09/30/2014  Medication Sig  . ALPRAZolam (XANAX) 1 MG tablet One tablet by mouth 4 times a day as needed.  . busPIRone (BUSPAR) 15 MG tablet Take 1 tablet (15 mg total) by mouth 3 (three) times daily.  . butalbital-acetaminophen-caffeine (FIORICET, ESGIC) 50-325-40 MG per tablet Take 1-2 tabs  as needed  And wean as directed.  . Cholecalciferol (VITAMIN D3) 2000 UNITS TABS Take 1 tablet by mouth daily.  Marland Kitchen. lisinopril-hydrochlorothiazide (PRINZIDE,ZESTORETIC) 20-12.5 MG per tablet Take 1 tablet by mouth daily. For hypertension  . Loratadine 10 MG CAPS Take by mouth as needed.  . [DISCONTINUED] ALPRAZolam (XANAX) 1 MG tablet One tablet by mouth 4 times a day as needed.  . [DISCONTINUED] ALPRAZolam (XANAX) 1 MG tablet One tablet by mouth 4 times a day as needed.  . [DISCONTINUED] busPIRone (BUSPAR) 15 MG tablet Take 1 tablet (15 mg total) by mouth 3 (three) times daily.  . [DISCONTINUED] busPIRone (BUSPAR) 15 MG tablet Take 1 tablet (15 mg total) by mouth 3 (three) times daily.  . [DISCONTINUED] butalbital-acetaminophen-caffeine (FIORICET, ESGIC) 50-325-40 MG per tablet Take 1-2 tabs  as needed   And wean as directed.  . [DISCONTINUED] hydrOXYzine (ATARAX/VISTARIL) 25 MG tablet Take 25 mg by mouth as needed.    EXAM:  BP 128/74 mmHg  Temp(Src) 98.2 F (36.8 C) (Oral)  Ht 5\' 3"  (1.6 m)  Wt 159 lb 12.8 oz (72.485 kg)  BMI 28.31 kg/m2  Body mass index is 28.31 kg/(m^2).  GENERAL: vitals reviewed and listed above, alert,  oriented, appears well hydrated and in no acute distress HEENT: atraumatic, conjunctiva  clear, no obvious abnormalities on inspection of external nose and ears  NECK: no obvious masses on inspection CV: HRRR, no clubbing cyanosis or  peripheral edema nl cap refill  MS: moves all extremities without noticeable focal  abnormality PSYCH: pleasant and cooperative,  mildy emotional about fathers death no tremor  Normal gait  Lab Results  Component Value Date   WBC 6.8 07/21/2014   HGB 10.1* 07/21/2014   HCT 30.4* 07/21/2014   PLT 251 07/21/2014   GLUCOSE 96 12/25/2012   CHOL 261* 12/25/2012   TRIG 94.0 12/25/2012   HDL 75.10 12/25/2012   LDLDIRECT 168.2 12/25/2012   ALT 19 05/01/2013   AST 20 05/01/2013   NA 138 12/25/2012   K 4.7 12/25/2012   CL 101 12/25/2012   CREATININE 0.9 12/25/2012   BUN 22 12/25/2012   CO2 31 12/25/2012   TSH 1.18 12/25/2012    ASSESSMENT AND PLAN:  Discussed the following assessment and plan:  Essential hypertension, benign - controled  Medication management - continued limitation of  fioricet can refill x 1  and come back in april   Anxiety state - refill buspar  and alpraz   Anemia, unspecified anemia type - seeing dr Myna HidalgoEnnever -Patient advised to return or notify health care team  if symptoms worsen ,persist or new concerns arise.  Patient Instructions  Blood pressure is good today . Condolences on your  Fathers passing.  Restart  buspar  Twice a day and then increase to 3 x per day. As before.   No meds will help grieving.  Still limit the fioricet to no more than 10 per month.   Neta MendsWanda K. Panosh M.D.  Total visit 25mins > 50% spent counseling and coordinating care    tox screen today

## 2014-09-30 NOTE — Patient Instructions (Addendum)
Blood pressure is good today . Condolences on your  Fathers passing.  Restart  buspar  Twice a day and then increase to 3 x per day. As before.   No meds will help grieving.  Still limit the fioricet to no more than 10 per month.

## 2014-10-06 ENCOUNTER — Telehealth: Payer: Self-pay | Admitting: Hematology & Oncology

## 2014-10-06 ENCOUNTER — Other Ambulatory Visit: Payer: Self-pay | Admitting: *Deleted

## 2014-10-06 DIAGNOSIS — E559 Vitamin D deficiency, unspecified: Secondary | ICD-10-CM

## 2014-10-06 DIAGNOSIS — F411 Generalized anxiety disorder: Secondary | ICD-10-CM

## 2014-10-06 NOTE — Telephone Encounter (Signed)
Pt moved 1-12 to 2-8

## 2014-10-07 ENCOUNTER — Other Ambulatory Visit: Payer: BLUE CROSS/BLUE SHIELD | Admitting: Lab

## 2014-10-07 ENCOUNTER — Ambulatory Visit: Payer: BC Managed Care – PPO | Admitting: Hematology & Oncology

## 2014-10-08 ENCOUNTER — Telehealth: Payer: Self-pay | Admitting: Hematology & Oncology

## 2014-10-08 NOTE — Telephone Encounter (Signed)
Pt left message wanting to know about next appointment. I left her message with 2-8 appointment

## 2014-10-16 ENCOUNTER — Encounter: Payer: Self-pay | Admitting: Internal Medicine

## 2014-10-29 ENCOUNTER — Telehealth: Payer: Self-pay | Admitting: Internal Medicine

## 2014-10-29 NOTE — Telephone Encounter (Addendum)
Pt request refill butalbital-acetaminophen-caffeine (FIORICET, ESGIC) 50-325-40 MG per tablet [facility] [place]

## 2014-10-30 MED ORDER — BUTALBITAL-APAP-CAFFEINE 50-325-40 MG PO TABS
ORAL_TABLET | ORAL | Status: DC
Start: 2014-10-30 — End: 2014-11-28

## 2014-10-30 NOTE — Telephone Encounter (Signed)
Please notify the pt that the correct amount of ten tablets was called to the pharmacy and left on voicemail.

## 2014-10-30 NOTE — Telephone Encounter (Signed)
Called to the pharmacy and left on machine. 

## 2014-10-30 NOTE — Telephone Encounter (Signed)
refill x 1 disp 10

## 2014-10-30 NOTE — Telephone Encounter (Signed)
Can you double check the refill on rx? Dr Fabian Sharppanosh said ok to refill x 1 . thanks

## 2014-11-03 ENCOUNTER — Ambulatory Visit (HOSPITAL_BASED_OUTPATIENT_CLINIC_OR_DEPARTMENT_OTHER): Payer: BLUE CROSS/BLUE SHIELD | Admitting: Hematology & Oncology

## 2014-11-03 ENCOUNTER — Other Ambulatory Visit (HOSPITAL_BASED_OUTPATIENT_CLINIC_OR_DEPARTMENT_OTHER): Payer: BLUE CROSS/BLUE SHIELD | Admitting: Lab

## 2014-11-03 ENCOUNTER — Encounter: Payer: Self-pay | Admitting: Hematology & Oncology

## 2014-11-03 VITALS — BP 108/56 | HR 67 | Temp 97.7°F | Resp 14 | Ht 63.0 in | Wt 153.0 lb

## 2014-11-03 DIAGNOSIS — E559 Vitamin D deficiency, unspecified: Secondary | ICD-10-CM

## 2014-11-03 DIAGNOSIS — F411 Generalized anxiety disorder: Secondary | ICD-10-CM

## 2014-11-03 DIAGNOSIS — D500 Iron deficiency anemia secondary to blood loss (chronic): Secondary | ICD-10-CM

## 2014-11-03 DIAGNOSIS — D7589 Other specified diseases of blood and blood-forming organs: Secondary | ICD-10-CM

## 2014-11-03 DIAGNOSIS — D7581 Myelofibrosis: Secondary | ICD-10-CM

## 2014-11-03 DIAGNOSIS — D509 Iron deficiency anemia, unspecified: Secondary | ICD-10-CM

## 2014-11-03 LAB — CBC WITH DIFFERENTIAL (CANCER CENTER ONLY)
BASO#: 0 10*3/uL (ref 0.0–0.2)
BASO%: 0.6 % (ref 0.0–2.0)
EOS%: 1.2 % (ref 0.0–7.0)
Eosinophils Absolute: 0.1 10*3/uL (ref 0.0–0.5)
HCT: 33.3 % — ABNORMAL LOW (ref 34.8–46.6)
HGB: 11.1 g/dL — ABNORMAL LOW (ref 11.6–15.9)
LYMPH#: 1.5 10*3/uL (ref 0.9–3.3)
LYMPH%: 29.8 % (ref 14.0–48.0)
MCH: 32.8 pg (ref 26.0–34.0)
MCHC: 33.3 g/dL (ref 32.0–36.0)
MCV: 99 fL (ref 81–101)
MONO#: 0.4 10*3/uL (ref 0.1–0.9)
MONO%: 6.8 % (ref 0.0–13.0)
NEUT#: 3.2 10*3/uL (ref 1.5–6.5)
NEUT%: 61.6 % (ref 39.6–80.0)
Platelets: 262 10*3/uL (ref 145–400)
RBC: 3.38 10*6/uL — ABNORMAL LOW (ref 3.70–5.32)
RDW: 12.4 % (ref 11.1–15.7)
WBC: 5.1 10*3/uL (ref 3.9–10.0)

## 2014-11-03 LAB — CHCC SATELLITE - SMEAR

## 2014-11-03 LAB — RETICULOCYTES (CHCC)
ABS Retic: 37.5 10*3/uL (ref 19.0–186.0)
RBC.: 3.41 MIL/uL — AB (ref 3.87–5.11)
Retic Ct Pct: 1.1 % (ref 0.4–2.3)

## 2014-11-03 NOTE — Progress Notes (Signed)
Hematology and Oncology Follow Up Visit  Brooke Pennington [id number] 1950-10-26 64 y.o. 11/03/2014   Principle Diagnosis:   Iron deficiency anemia  Current Therapy:    IV iron as indicated     Interim History:  Ms.  Brooke Pennington is back for second offices. We first saw her back in October. That point time, I felt that she had iron deficiency anemia.  We did extensive amount of workup on her. Her ferritin was 60 but her iron saturation was only 28%. I felt that given her other issues, that the ferritin was on the low side.  Her vitamin B-12 was 588. Her folate was 12.3.  We checked her stool for blood. This was all negative.  She has some degree of malabsorption because she has celiac disease.  She got IV iron. She responded very well to that. It is made her feel better.  She has had no obvious bleeding. She's had no nausea vomiting. She's had no cough. She's had no shortness of breath. She's had no rashes.  Overall, her performance status is ECOG 1.  Medications:  Current outpatient prescriptions:  .  ALPRAZolam (XANAX) 1 MG tablet, One tablet by mouth 4 times a day as needed., Disp: 120 tablet, Rfl: 2 .  busPIRone (BUSPAR) 15 MG tablet, Take 1 tablet (15 mg total) by mouth 3 (three) times daily., Disp: 90 tablet, Rfl: 6 .  Cholecalciferol (VITAMIN D3) 2000 UNITS TABS, Take 1 tablet by mouth daily., Disp: , Rfl:  .  lisinopril-hydrochlorothiazide (PRINZIDE,ZESTORETIC) 20-12.5 MG per tablet, Take 1 tablet by mouth daily. For hypertension, Disp: 90 tablet, Rfl: 3 .  butalbital-acetaminophen-caffeine (FIORICET, ESGIC) 50-325-40 MG per tablet, Take 1-2 tabs  as needed   And wean as directed. (Patient not taking: Reported on 11/03/2014), Disp: 10 tablet, Rfl: 0  Allergies: No Known Allergies  Past Medical History, Surgical history, Social history, and Family History were reviewed and updated.  Review of Systems: As above  Physical Exam:  height is 5\' 3"  (1.6 m) and weight is 153 lb (69.4  kg). Her oral temperature is 97.7 F (36.5 C). Her blood pressure is 108/56 and her pulse is 67. Her respiration is 14.   Well-developed and well-nourished white female in no obvious distress. Head and neck exam shows no ocular or oral lesions. There are no palpable cervical or supraclavicular lymph nodes. Lungs are clear. Cardiac exam regular rate and rhythm with no murmurs, rubs or bruits. Abdomen is soft. She has good bowel sounds. There is no fluid wave. There is no palpable liver or spleen tip. Back exam shows no tenderness over the spine, ribs or hips. Extremities shows no clubbing, cyanosis or edema. Neurological exam shows no focal neurological deficits. Skin exam shows no rashes, ecchymoses or petechia.  Lab Results  Component Value Date   WBC 5.1 11/03/2014   HGB 11.1* 11/03/2014   HCT 33.3* 11/03/2014   MCV 99 11/03/2014   PLT 262 11/03/2014     Chemistry      Component Value Date/Time   NA 138 12/25/2012 1536   K 4.7 12/25/2012 1536   CL 101 12/25/2012 1536   CO2 31 12/25/2012 1536   BUN 22 12/25/2012 1536   CREATININE 0.9 12/25/2012 1536      Component Value Date/Time   CALCIUM 9.2 12/25/2012 1536   ALKPHOS 87 05/01/2013 1346   AST 20 05/01/2013 1346   ALT 19 05/01/2013 1346   BILITOT 0.6 05/01/2013 1346  Impression and Plan: Ms. Brooke Pennington is 64 year old white female. She has responded to IV iron. She still a little anemic.  I looked at her blood smear. Red cells appear normal in size and shape. I do not see much in the way of macrocytic changes. She has no rouleau formation. She has no nucleated red cells. White cells and platelets looked okay.  It will be interesting to see what her erythropoietin level is. It is possible that she may have erythropoietin deficiency.  I think that with how well her blood looked today, we can probably get her back in about 3 months.  I spent about 30 minutes with she and her husband. I went over her lab work. I looked at her  blood smear. I explained my recommendations.   Josph Macho, MD 2/8/20164:02 PM

## 2014-11-04 ENCOUNTER — Telehealth: Payer: Self-pay

## 2014-11-04 LAB — IRON AND TIBC CHCC
%SAT: 61 % — AB (ref 21–57)
IRON: 139 ug/dL (ref 41–142)
TIBC: 230 ug/dL — AB (ref 236–444)
UIBC: 91 ug/dL — AB (ref 120–384)

## 2014-11-04 LAB — FERRITIN CHCC: Ferritin: 299 ng/ml — ABNORMAL HIGH (ref 9–269)

## 2014-11-04 NOTE — Telephone Encounter (Addendum)
-----   Message from Josph MachoPeter R Ennever, MD sent at 11/04/2014  7:52 AM EST ----- Call - Vit D and B12 levels are fantastic!!!  Pete  Message left on generic VM to contact our office for lab results. dph

## 2014-11-05 LAB — VITAMIN B12: VITAMIN B 12: 857 pg/mL (ref 211–911)

## 2014-11-05 LAB — ERYTHROPOIETIN: Erythropoietin: 6.2 m[IU]/mL (ref 2.6–18.5)

## 2014-11-05 LAB — VITAMIN D 25 HYDROXY (VIT D DEFICIENCY, FRACTURES): Vit D, 25-Hydroxy: 33 ng/mL (ref 30–100)

## 2014-11-06 ENCOUNTER — Telehealth: Payer: Self-pay | Admitting: *Deleted

## 2014-11-06 NOTE — Telephone Encounter (Signed)
-----   Message from Josph MachoPeter R Ennever, MD sent at 11/05/2014  7:39 AM EST ----- Call - iron is much better!!  Brooke Pennington

## 2014-11-19 ENCOUNTER — Telehealth: Payer: Self-pay | Admitting: Internal Medicine

## 2014-11-19 NOTE — Telephone Encounter (Signed)
Not due until march 4th ( last on feb 4 ) Can refill on or after march 4th

## 2014-11-19 NOTE — Telephone Encounter (Signed)
Pt request refill butalbital-acetaminophen-caffeine (FIORICET, ESGIC) 50-325-40 MG per tablet °[facility]/[place] °

## 2014-11-20 NOTE — Telephone Encounter (Signed)
LM informing the pt that I am unable to fill her prescription at this time.  Not due until March 4th.  She may call back when due.

## 2014-11-26 ENCOUNTER — Telehealth: Payer: Self-pay | Admitting: Internal Medicine

## 2014-11-26 DIAGNOSIS — Z0000 Encounter for general adult medical examination without abnormal findings: Secondary | ICD-10-CM

## 2014-11-26 NOTE — Telephone Encounter (Signed)
Pt is ready for refill on Friday. Thanks butalbital-acetaminophen-caffeine (FIORICET, ESGIC) 50-325-40 MG per tablet  [facility]

## 2014-11-26 NOTE — Telephone Encounter (Addendum)
Pt had labs done at dr ennerver's office on 2/8.   Pt has [facility] labs on 4/11.  Do you need her to repeat the b12 and thyroid? What labs does she need? Thanks. Pt also wants to know if you need to add tox screen?

## 2014-11-28 MED ORDER — BUTALBITAL-APAP-CAFFEINE 50-325-40 MG PO TABS: ORAL_TABLET | ORAL | Status: DC

## 2014-11-28 NOTE — Telephone Encounter (Signed)
Called to the pharmacy and left on machine. 

## 2014-11-28 NOTE — Telephone Encounter (Signed)
No need to do b12 or vit d  neds tsh lipid panel  Bmp and lft  Do not  Need to do tox screen   Until 6 months from last one.    Lab Results  Component Value Date   WBC 5.1 11/03/2014   HGB 11.1* 11/03/2014   HCT 33.3* 11/03/2014   PLT 262 11/03/2014   GLUCOSE 96 12/25/2012   CHOL 261* 12/25/2012   TRIG 94.0 12/25/2012   HDL 75.10 12/25/2012   LDLDIRECT 168.2 12/25/2012   ALT 19 05/01/2013   AST 20 05/01/2013   NA 138 12/25/2012   K 4.7 12/25/2012   CL 101 12/25/2012   CREATININE 0.9 12/25/2012   BUN 22 12/25/2012   CO2 31 12/25/2012   TSH 1.18 12/25/2012

## 2014-11-28 NOTE — Telephone Encounter (Signed)
I have placed the orders for lab work.  Please help pt set up appt.

## 2014-12-23 ENCOUNTER — Telehealth: Payer: Self-pay | Admitting: Internal Medicine

## 2014-12-23 NOTE — Telephone Encounter (Signed)
Pt would like if you will make an exception and refill butalbital-acetaminophen-caffeine (FIORICET, ESGIC) 50-325-40 MG per tablet One day early on Friday 4/1.(due  monday) Pt will be [facility] [place]

## 2014-12-23 NOTE — Telephone Encounter (Signed)
Done

## 2014-12-23 NOTE — Telephone Encounter (Signed)
Also, pt has cpe on 4/18.  Will need ALPRAZolam (XANAX) 1 MG tablet before the cpe., and would like to pu hard copy on 4/6 when she does her cpx labs.  Also told pt no need for tox screen until june, but wants to confirm that this is correct. pls advise.

## 2014-12-23 NOTE — Telephone Encounter (Signed)
Ok to  Do both of these request tox screen in June

## 2014-12-25 NOTE — Telephone Encounter (Signed)
It is too early to fill the script and put it at the front desk.  Please have her call and day or two ahead and I will put it at the front desk.

## 2014-12-25 NOTE — Telephone Encounter (Signed)
WP, gave authorization for butalbital-acetaminophen-caffeine (FIORICET, ESGIC) 50-325-40 MG per tablet to be called in Friday.  Will call in at that time.

## 2014-12-25 NOTE — Telephone Encounter (Signed)
pls call in butalbital-acetaminophen-caffeine (FIORICET, ESGIC) 50-325-40 MG per tablet [facility] Per dr Fabian Sharppanosh, ok to do, thanks.  Pt will call back 4/4 for xanax rx

## 2014-12-26 MED ORDER — BUTALBITAL-APAP-CAFFEINE 50-325-40 MG PO TABS: ORAL_TABLET | ORAL | Status: DC

## 2014-12-26 NOTE — Telephone Encounter (Signed)
butalbital-acetaminophen-caffeine (FIORICET, ESGIC) 50-325-40 MG per tablet called to the pharmacy and left on machine.

## 2014-12-29 NOTE — Telephone Encounter (Signed)
Pt called to be able to pu Xanax script on wed when she comes in for labs

## 2014-12-30 MED ORDER — ALPRAZOLAM 1 MG PO TABS
ORAL_TABLET | ORAL | Status: DC
Start: 2014-12-30 — End: 2015-01-12

## 2014-12-30 NOTE — Addendum Note (Signed)
Addended by: Raj JanusADKINS, Verlan Grotz T on: 12/30/2014 11:49 AM   Modules accepted: Orders

## 2014-12-30 NOTE — Telephone Encounter (Signed)
Printed and will place at the front desk.

## 2014-12-31 ENCOUNTER — Other Ambulatory Visit (INDEPENDENT_AMBULATORY_CARE_PROVIDER_SITE_OTHER): Payer: BLUE CROSS/BLUE SHIELD

## 2014-12-31 DIAGNOSIS — Z0000 Encounter for general adult medical examination without abnormal findings: Secondary | ICD-10-CM | POA: Diagnosis not present

## 2014-12-31 LAB — LIPID PANEL
Cholesterol: 240 mg/dL — ABNORMAL HIGH (ref 0–200)
HDL: 58.6 mg/dL (ref >39.00)
LDL Cholesterol: 163 mg/dL — ABNORMAL HIGH (ref 0–99)
NONHDL: 181.4
Total CHOL/HDL Ratio: 4
Triglycerides: 91 mg/dL (ref 0.0–149.0)
VLDL: 18.2 mg/dL (ref 0.0–40.0)

## 2014-12-31 LAB — BASIC METABOLIC PANEL
BUN: 27 mg/dL — ABNORMAL HIGH (ref 6–23)
CO2: 31 meq/L (ref 19–32)
CREATININE: 1.39 mg/dL — AB (ref 0.40–1.20)
Calcium: 9.7 mg/dL (ref 8.4–10.5)
Chloride: 103 mEq/L (ref 96–112)
GFR: 40.86 mL/min — ABNORMAL LOW (ref >60.00)
Glucose, Bld: 71 mg/dL (ref 70–99)
Potassium: 4.3 mEq/L (ref 3.5–5.1)
Sodium: 138 mEq/L (ref 135–145)

## 2014-12-31 LAB — HEPATIC FUNCTION PANEL
ALT: 19 U/L (ref 0–35)
AST: 21 U/L (ref 0–37)
Albumin: 4.1 g/dL (ref 3.5–5.2)
Alkaline Phosphatase: 59 U/L (ref 39–117)
BILIRUBIN DIRECT: 0.1 mg/dL (ref 0.0–0.3)
BILIRUBIN TOTAL: 0.3 mg/dL (ref 0.2–1.2)
Total Protein: 6.7 g/dL (ref 6.0–8.3)

## 2014-12-31 LAB — TSH: TSH: 2.3 u[IU]/mL (ref 0.35–4.50)

## 2015-01-05 ENCOUNTER — Other Ambulatory Visit: Payer: BLUE CROSS/BLUE SHIELD

## 2015-01-12 ENCOUNTER — Encounter: Payer: Self-pay | Admitting: Internal Medicine

## 2015-01-12 ENCOUNTER — Ambulatory Visit (INDEPENDENT_AMBULATORY_CARE_PROVIDER_SITE_OTHER): Payer: BLUE CROSS/BLUE SHIELD | Admitting: Internal Medicine

## 2015-01-12 VITALS — BP 124/80 | HR 72 | Temp 98.6°F | Resp 20 | Ht 63.0 in | Wt 157.0 lb

## 2015-01-12 DIAGNOSIS — I10 Essential (primary) hypertension: Secondary | ICD-10-CM | POA: Diagnosis not present

## 2015-01-12 DIAGNOSIS — Z0000 Encounter for general adult medical examination without abnormal findings: Secondary | ICD-10-CM

## 2015-01-12 DIAGNOSIS — F411 Generalized anxiety disorder: Secondary | ICD-10-CM | POA: Diagnosis not present

## 2015-01-12 DIAGNOSIS — D649 Anemia, unspecified: Secondary | ICD-10-CM

## 2015-01-12 DIAGNOSIS — R748 Abnormal levels of other serum enzymes: Secondary | ICD-10-CM | POA: Diagnosis not present

## 2015-01-12 DIAGNOSIS — Z79899 Other long term (current) drug therapy: Secondary | ICD-10-CM

## 2015-01-12 DIAGNOSIS — R7989 Other specified abnormal findings of blood chemistry: Secondary | ICD-10-CM | POA: Insufficient documentation

## 2015-01-12 LAB — POCT URINALYSIS DIP (MANUAL ENTRY)
BILIRUBIN UA: NEGATIVE
BILIRUBIN UA: NEGATIVE
GLUCOSE UA: NEGATIVE
LEUKOCYTES UA: NEGATIVE
NITRITE UA: NEGATIVE
PH UA: 5
Protein Ur, POC: NEGATIVE
RBC UA: NEGATIVE
Spec Grav, UA: 1.02
Urobilinogen, UA: 0.2

## 2015-01-12 LAB — BASIC METABOLIC PANEL
BUN: 41 mg/dL — ABNORMAL HIGH (ref 6–23)
CALCIUM: 9.6 mg/dL (ref 8.4–10.5)
CO2: 29 meq/L (ref 19–32)
CREATININE: 1.45 mg/dL — AB (ref 0.40–1.20)
Chloride: 105 mEq/L (ref 96–112)
GFR: 38.91 mL/min — ABNORMAL LOW (ref >60.00)
GLUCOSE: 79 mg/dL (ref 70–99)
Potassium: 4.9 mEq/L (ref 3.5–5.1)
Sodium: 138 mEq/L (ref 135–145)

## 2015-01-12 MED ORDER — LISINOPRIL-HYDROCHLOROTHIAZIDE 20-12.5 MG PO TABS: 1.0000 | ORAL_TABLET | Freq: Every day | ORAL | Status: DC

## 2015-01-12 MED ORDER — ALPRAZOLAM 1 MG PO TABS: ORAL_TABLET | ORAL | Status: DC

## 2015-01-12 MED ORDER — BUTALBITAL-APAP-CAFFEINE 50-325-40 MG PO TABS: ORAL_TABLET | ORAL | Status: DC

## 2015-01-12 NOTE — Progress Notes (Signed)
Pre visit review using our clinic review tool, if applicable. No additional management support is needed unless otherwise documented below in the visit note. 

## 2015-01-12 NOTE — Patient Instructions (Addendum)
Get your mammogram.  And gyne  check. Recheck kidney function .   Today . Limit   use of  advil and aleve products .  Keep follow up with Dr.  Myna Hidalgoennever and  Ask about routine colon check in 2017.   Still limit the esgic   To 10 per month .   Healthy lifestyle includes : At least 150 minutes of exercise weeks  , weight at healthy levels, which is usually   BMI 19-25. Avoid trans fats and processed foods;  Increase fresh fruits and veges to 5 servings per day. And avoid sweet beverages including tea and juice. Mediterranean diet with olive oil and nuts have been noted to be heart and brain healthy . Avoid tobacco products . Limit  alcohol to  7 per week for women and 14 servings for men.  Get adequate sleep . Wear seat belts . Don't text and drive .     tox screen due in June  Or therabouts .

## 2015-01-12 NOTE — Progress Notes (Signed)
Chief Complaint  Patient presents with  . Annual Exam    BC/BS    HPI: Patient  Brooke Pennington  64 y.o. comes in today for Preventive Health Care visit  And med management follow up.  Still stressed and busy since death of her father from chf somewhat acute.  Taking alprax reg as before  Asks for  Another  butalbital to day  Know early .  Seeing  Dr Myna Hidalgo and following  .  Overdue fo mammo and gyne   Health Maintenance  Topic Date Due  . HIV Screening  03/16/1968  . PAP SMEAR  09/26/2012  . ZOSTAVAX  03/16/2013  . MAMMOGRAM  10/31/2013  . TETANUS/TDAP  04/13/2015 (Originally 03/16/1972)  . INFLUENZA VACCINE  05/28/2015 (Originally 04/27/2015)  . COLONOSCOPY  05/24/2016   Health Maintenance Review LIFESTYLE:  Exercise:   Since lose of dad deceased   [place].    Tobacco/ETS:  no Alcohol:   One a week.  Sugar beverages:  Soft drinks  . Every  Day 1/2 cup.  Sleep:  About  6  Drug use: no  Bone density:   Colonoscopy:  ? Routine   2017 . PAP: due  MAMMO:  Due  .    ROS:  GEN/ HEENT: No fever, significant weight changes sweats headaches vision problems hearing changes, CV/ PULM; No chest pain shortness of breath cough, syncope,edema  change in exercise tolerance. GI /GU: No adominal pain, vomiting, change in bowel habits. No blood in the stool. No significant GU symptoms. SKIN/HEME: ,no acute skin rashes suspicious lesions or bleeding. No lymphadenopathy, nodules, masses.  NEURO/ PSYCH:  No neurologic signs such as weakness numbness. No depression anxiety. IMM/ Allergy: No unusual infections.  Allergy .   REST of 12 system review negative except as per HPI   Past Medical History  Diagnosis Date  . Eczema   . Headache(784.0)   . Hypertension   . Menopause     age 64 with sx no hrt  . Anal fissure     hx of same  . H/O rectal polypectomy     ?  uncertain  if colonic or rectal colonsocopy 8 2007 gi [place]  . Lymphocytic gastritis     neg SBBX for celiac  in  8 2007   . PMR (polymyalgia rheumatica)     Possible diagnoses by primary care physician with her anemia and elevated sedimentation rate of 60 was on prednisone but stopped it herself and then felt fine    Past Surgical History  Procedure Laterality Date  . No past surgeries      Family History  Problem Relation Age of Onset  . Coronary artery disease Father   . Heart disease Father   . COPD Mother   . Anemia Sister     needing transfusion some> vit b12 ?    History   Social History  . Marital Status: Married    Spouse Name: N/A  . Number of Children: N/A  . Years of Education: N/A   Social History Main Topics  . Smoking status: Never Smoker   . Smokeless tobacco: Never Used     Comment: never used tobacco  . Alcohol Use: 0.6 oz/week    1 Glasses of wine per week  . Drug Use: No  . Sexual Activity: Yes   Other Topics Concern  . None   Social History Narrative   Married remarried   he is self-employed  hhof 2    College degree worked at [employer]. 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  Has smoke detector and wears seat belts.  No firearms. No excess sun exposure. Sees dentist regularly . No depression      Only 1 car at this time   No etoh   Father passed dec 23 15 chf heart related age 66          Outpatient Encounter Prescriptions as of 01/12/2015  Medication Sig  . ALPRAZolam (XANAX) 1 MG tablet One tablet by mouth 4 times a day as needed. Fill on or after May 4th.  Marland Kitchen ammonium lactate (LAC-HYDRIN) 12 % lotion Apply 1 application topically as needed.   . busPIRone (BUSPAR) 15 MG tablet Take 1 tablet (15 mg total) by mouth 3 (three) times daily.  . butalbital-acetaminophen-caffeine (FIORICET, ESGIC) 50-325-40 MG per tablet Take 1-2 tabs  as needed   And wean as directed.  . Cholecalciferol (VITAMIN D3) 2000 UNITS TABS Take 1 tablet by mouth daily.  Marland Kitchen latanoprost (XALATAN) 0.005 % ophthalmic solution Place 1 drop into both eyes at bedtime.   Marland Kitchen  lisinopril-hydrochlorothiazide (PRINZIDE,ZESTORETIC) 20-12.5 MG per tablet Take 1 tablet by mouth daily. For hypertension  . tretinoin (RETIN-A) 0.1 % cream Apply 1 application topically at bedtime.   . [DISCONTINUED] ALPRAZolam (XANAX) 1 MG tablet One tablet by mouth 4 times a day as needed.  . [DISCONTINUED] butalbital-acetaminophen-caffeine (FIORICET, ESGIC) 50-325-40 MG per tablet Take 1-2 tabs  as needed   And wean as directed.  . [DISCONTINUED] lisinopril-hydrochlorothiazide (PRINZIDE,ZESTORETIC) 20-12.5 MG per tablet Take 1 tablet by mouth daily. For hypertension    EXAM:  BP 124/80 mmHg  Pulse 72  Temp(Src) 98.6 F (37 C) (Oral)  Resp 20  Ht 5\' 3"  (1.6 m)  Wt 157 lb (71.215 kg)  BMI 27.82 kg/m2  SpO2 98%  Body mass index is 27.82 kg/(m^2).  Physical Exam: Vital signs reviewed WUJ:WJXB is a well-developed well-nourished alert cooperative    who appearsr stated age in no acute distress.  HEENT: normocephalic atraumatic , Eyes: PERRL EOM's full, conjunctiva clear, Nares: paten,t no deformity discharge or tenderness., Ears: no deformity EAC's clear TMs with normal landmarks. Mouth: clear OP, no lesions, edema.  Moist mucous membranes. Dentition in adequate repair. NECK: supple without masses, thyromegaly or bruits. CHEST/PULM:  Clear to auscultation and percussion breath sounds equal no wheeze , rales or rhonchi. No chest wall deformities or tenderness. Breast: normal by inspection . No dimpling, discharge, masses, tenderness or discharge . CV: PMI is nondisplaced, S1 S2 no gallops, murmurs, rubs. Peripheral pulses are full without delay.No JVD .  Breast: normal by inspection . No dimpling, discharge, masses, tenderness or discharge .axilla clear . ABDOMEN: Bowel sounds normal nontender  No guard or rebound, no hepato splenomegal no CVA tenderness.  No hernia. Extremtities:  No clubbing cyanosis or edema, no acute joint swelling or redness no focal atrophy NEURO:  Oriented x3,  cranial nerves 3-12 appear to be intact, no obvious focal weakness,gait within normal limits no abnormal reflexes or asymmetrical SKIN: No acute rashes normal turgor, color, no bruising or petechiae. PSYCH: Oriented, good eye contact, cognition and judgment appear normal. Emotional talking for father but  Not otherwise  excessively anxious and no depression  baseline worry .  LN: no cervical axillary inguinal adenopathy  BP Readings from Last 3 Encounters:  01/12/15 124/80  11/03/14 108/56  09/30/14 128/74     ASSESSMENT AND PLAN:  Discussed the following assessment and plan:  Visit for preventive health examination - overdue mammo and pap disc  toget gyne appt - Plan: POCT urinalysis dipstick  HYPERTENSION, BENIGN - at this time remain on meds  contrlled  - Plan: lisinopril-hydrochlorothiazide (PRINZIDE,ZESTORETIC) 20-12.5 MG per tablet, Basic metabolic panel, Protein / creatinine ratio, urine, POCT urinalysis dipstick  Anemia, unspecified anemia type - under care eval  High risk medication use - stable limit butabitaol to 10 per month  still on alprazolam every day - Plan: lisinopril-hydrochlorothiazide (PRINZIDE,ZESTORETIC) 20-12.5 MG per tablet  Essential hypertension, benign  Anxiety state - Plan: lisinopril-hydrochlorothiazide (PRINZIDE,ZESTORETIC) 20-12.5 MG per tablet  Elevated serum creatinine - see note recheck follow urine protein ua  - Plan: Basic metabolic panel, Protein / creatinine ratio, urine, POCT urinalysis dipstick Limiting butalbital to 10 per month prefer less or no use . Uncertain cause jupp in creatinine  Is on the  acei  Will follow  Disc . Due for Patient Care Team: Madelin Headings, MD as PCP - General (Internal Medicine) Alexander Mt, MD as Consulting Physician (Oncology) Patient Instructions  Get your mammogram.  And gyne  check. Recheck kidney function .   Today . Limit   use of  advil and aleve products .  Keep follow up with Dr.   Myna Hidalgo and  Ask about routine colon check in 2017.   Still limit the esgic   To 10 per month .   Healthy lifestyle includes : At least 150 minutes of exercise weeks  , weight at healthy levels, which is usually   BMI 19-25. Avoid trans fats and processed foods;  Increase fresh fruits and veges to 5 servings per day. And avoid sweet beverages including tea and juice. Mediterranean diet with olive oil and nuts have been noted to be heart and brain healthy . Avoid tobacco products . Limit  alcohol to  7 per week for women and 14 servings for men.  Get adequate sleep . Wear seat belts . Don't text and drive .     tox screen due in June  Or therabouts .    Lab Results  Component Value Date   WBC 5.1 11/03/2014   HGB 11.1* 11/03/2014   HCT 33.3* 11/03/2014   PLT 262 11/03/2014   GLUCOSE 79 01/12/2015   CHOL 240* 12/31/2014   TRIG 91.0 12/31/2014   HDL 58.60 12/31/2014   LDLDIRECT 168.2 12/25/2012   LDLCALC 163* 12/31/2014   ALT 19 12/31/2014   AST 21 12/31/2014   NA 138 01/12/2015   K 4.9 01/12/2015   CL 105 01/12/2015   CREATININE 1.45* 01/12/2015   BUN 41* 01/12/2015   CO2 29 01/12/2015   TSH 2.30 12/31/2014    Marico Buckle K. Vanshika Jastrzebski M.D.  Impression and Plan: Ms. Branagan is 64 year old white female. She has responded to IV iron. She still a little anemic.  I looked at her blood smear. Red cells appear normal in size and shape. I do not see much in the way of macrocytic changes. She has no rouleau formation. She has no nucleated red cells. White cells and platelets looked okay.  It will be interesting to see what her erythropoietin level is. It is possible that she may have erythropoietin deficiency.  I think that with how well her blood looked today, we can probably get her back in about 3 months.  I spent about 30 minutes with she and her husband. I went over her lab work. I looked  at her blood smear. I explained my recommendations.   Josph MachoENNEVER,PETER R, MD 2/8/20164:02 PM

## 2015-01-13 LAB — PROTEIN / CREATININE RATIO, URINE
Creatinine, Urine: 126.7 mg/dL
PROTEIN CREATININE RATIO: 0.08 (ref <0.15)
Total Protein, Urine: 10 mg/dL (ref 5–24)

## 2015-01-23 ENCOUNTER — Encounter: Payer: Self-pay | Admitting: Family Medicine

## 2015-01-29 ENCOUNTER — Telehealth: Payer: Self-pay | Admitting: Internal Medicine

## 2015-01-29 NOTE — Telephone Encounter (Signed)
Pt missed your call last week and has a couple of questions to ask if you will pls cb

## 2015-01-30 NOTE — Telephone Encounter (Signed)
Called and spoke to the pt.  She was worried that she had a kidney disease or was going into kidney failure.  Reassured the pt that this was not the case.  She will have repeat lab work in 2 weeks.  At that time [facility] will make a decision if a second opinion is needed.  Pt informed.

## 2015-02-02 ENCOUNTER — Other Ambulatory Visit: Payer: BLUE CROSS/BLUE SHIELD

## 2015-02-02 ENCOUNTER — Ambulatory Visit: Payer: BLUE CROSS/BLUE SHIELD | Admitting: Hematology & Oncology

## 2015-02-05 ENCOUNTER — Other Ambulatory Visit: Payer: Self-pay | Admitting: Internal Medicine

## 2015-02-05 NOTE — Telephone Encounter (Signed)
Pt will be here for labs on 5/19 and would like to pu  busPIRone (BUSPAR) 15 MG tablet rx When she comes in.

## 2015-02-06 NOTE — Telephone Encounter (Signed)
sorry that was my fault, the rx that needs refilled is butalbital-acetaminophen-caffeine (FIORICET, ESGIC) 50-325-40 MG per tablet

## 2015-02-06 NOTE — Telephone Encounter (Signed)
Ok to refill x 1  

## 2015-02-06 NOTE — Telephone Encounter (Signed)
Filled for seven months in Jan.  Should still have refills on file.  Left a message for the pt to check with her pharmacy and call back if needed.

## 2015-02-09 MED ORDER — BUTALBITAL-APAP-CAFFEINE 50-325-40 MG PO TABS: ORAL_TABLET | ORAL | Status: DC

## 2015-02-09 NOTE — Telephone Encounter (Signed)
Rx printed and ready for Dr. Fabian SharpPanosh to sign.

## 2015-02-09 NOTE — Addendum Note (Signed)
Addended by: Azucena FreedMILLNER, Santos Hardwick C on: 02/09/2015 10:33 AM   Modules accepted: Orders

## 2015-02-12 ENCOUNTER — Other Ambulatory Visit (INDEPENDENT_AMBULATORY_CARE_PROVIDER_SITE_OTHER): Payer: BLUE CROSS/BLUE SHIELD

## 2015-02-12 DIAGNOSIS — R7989 Other specified abnormal findings of blood chemistry: Secondary | ICD-10-CM

## 2015-02-12 DIAGNOSIS — R748 Abnormal levels of other serum enzymes: Secondary | ICD-10-CM

## 2015-02-12 LAB — BASIC METABOLIC PANEL
BUN: 21 mg/dL (ref 6–23)
CO2: 33 mEq/L — ABNORMAL HIGH (ref 19–32)
Calcium: 9.7 mg/dL (ref 8.4–10.5)
Chloride: 100 mEq/L (ref 96–112)
Creatinine, Ser: 1.02 mg/dL (ref 0.40–1.20)
GFR: 58.38 mL/min — ABNORMAL LOW (ref >60.00)
GLUCOSE: 71 mg/dL (ref 70–99)
POTASSIUM: 4 meq/L (ref 3.5–5.1)
SODIUM: 137 meq/L (ref 135–145)

## 2015-02-13 ENCOUNTER — Telehealth: Payer: Self-pay | Admitting: Internal Medicine

## 2015-02-13 NOTE — Telephone Encounter (Signed)
Lab improved  Go back on 1/2 pill per day   Lis hctz instead of whole dose  Check bmp in  About  3 months and ROV  with results

## 2015-02-13 NOTE — Telephone Encounter (Signed)
Pt instructed to dc lisinopril-hydrochlorothiazide (PRINZIDE,ZESTORETIC) 20-12.5 MG per tablet 2 wks prior to lab. Pt wants to know if she needs to resume taking or wait until labs get back? Pt would like results.

## 2015-02-16 NOTE — Telephone Encounter (Signed)
LM for the pt to return my call. 

## 2015-02-17 NOTE — Telephone Encounter (Signed)
LM for the pt to return my call. 

## 2015-02-18 ENCOUNTER — Encounter: Payer: Self-pay | Admitting: Family Medicine

## 2015-02-18 NOTE — Telephone Encounter (Signed)
LM for the pt to return my call.  Have made multiple attempts to reach the pt.  Will now send a letter and close the message.

## 2015-02-20 ENCOUNTER — Other Ambulatory Visit: Payer: Self-pay | Admitting: Family Medicine

## 2015-02-20 DIAGNOSIS — I10 Essential (primary) hypertension: Secondary | ICD-10-CM

## 2015-02-20 DIAGNOSIS — Z79899 Other long term (current) drug therapy: Secondary | ICD-10-CM

## 2015-03-04 ENCOUNTER — Other Ambulatory Visit (HOSPITAL_BASED_OUTPATIENT_CLINIC_OR_DEPARTMENT_OTHER): Payer: BLUE CROSS/BLUE SHIELD

## 2015-03-04 ENCOUNTER — Ambulatory Visit (HOSPITAL_BASED_OUTPATIENT_CLINIC_OR_DEPARTMENT_OTHER): Payer: BLUE CROSS/BLUE SHIELD | Admitting: Hematology & Oncology

## 2015-03-04 VITALS — BP 106/62 | HR 71 | Temp 98.0°F | Resp 20 | Wt 154.0 lb

## 2015-03-04 DIAGNOSIS — D7581 Myelofibrosis: Secondary | ICD-10-CM

## 2015-03-04 DIAGNOSIS — E559 Vitamin D deficiency, unspecified: Secondary | ICD-10-CM

## 2015-03-04 DIAGNOSIS — D7589 Other specified diseases of blood and blood-forming organs: Secondary | ICD-10-CM

## 2015-03-04 DIAGNOSIS — D500 Iron deficiency anemia secondary to blood loss (chronic): Secondary | ICD-10-CM

## 2015-03-04 DIAGNOSIS — D509 Iron deficiency anemia, unspecified: Secondary | ICD-10-CM | POA: Diagnosis not present

## 2015-03-04 DIAGNOSIS — D501 Sideropenic dysphagia: Secondary | ICD-10-CM

## 2015-03-04 LAB — CBC WITH DIFFERENTIAL (CANCER CENTER ONLY)
BASO#: 0 10*3/uL (ref 0.0–0.2)
BASO%: 0.4 % (ref 0.0–2.0)
EOS ABS: 0.1 10*3/uL (ref 0.0–0.5)
EOS%: 1 % (ref 0.0–7.0)
HCT: 34.8 % (ref 34.8–46.6)
HEMOGLOBIN: 11.9 g/dL (ref 11.6–15.9)
LYMPH#: 1.5 10*3/uL (ref 0.9–3.3)
LYMPH%: 30.1 % (ref 14.0–48.0)
MCH: 33.5 pg (ref 26.0–34.0)
MCHC: 34.2 g/dL (ref 32.0–36.0)
MCV: 98 fL (ref 81–101)
MONO#: 0.3 10*3/uL (ref 0.1–0.9)
MONO%: 6.3 % (ref 0.0–13.0)
NEUT%: 62.2 % (ref 39.6–80.0)
NEUTROS ABS: 3.2 10*3/uL (ref 1.5–6.5)
PLATELETS: 258 10*3/uL (ref 145–400)
RBC: 3.55 10*6/uL — ABNORMAL LOW (ref 3.70–5.32)
RDW: 11.8 % (ref 11.1–15.7)
WBC: 5.1 10*3/uL (ref 3.9–10.0)

## 2015-03-04 LAB — RETICULOCYTES (CHCC)
ABS Retic: 29.5 10*3/uL (ref 19.0–186.0)
RBC.: 3.69 MIL/uL — ABNORMAL LOW (ref 3.87–5.11)
Retic Ct Pct: 0.8 % (ref 0.4–2.3)

## 2015-03-04 LAB — IRON AND TIBC CHCC
%SAT: 48 % (ref 21–57)
Iron: 105 ug/dL (ref 41–142)
TIBC: 220 ug/dL — ABNORMAL LOW (ref 236–444)
UIBC: 115 ug/dL — ABNORMAL LOW (ref 120–384)

## 2015-03-04 LAB — FERRITIN CHCC: FERRITIN: 214 ng/mL (ref 9–269)

## 2015-03-04 LAB — CHCC SATELLITE - SMEAR

## 2015-03-04 NOTE — Progress Notes (Signed)
Hematology and Oncology Follow Up Visit  Brooke Pennington [id number] 22-Jul-1951 64 y.o. 03/04/2015   Principle Diagnosis:   Iron deficiency anemia  Current Therapy:    IV iron as indicated     Interim History:  Ms.  Brooke Pennington is back for follow-up. We last saw her back in February. Since then, she's been doing well. She feels well.  Back in February, her iron studies were pretty much normal. Her ferritin was 299 with iron saturation of 61%.  She's had no change in bowel or bladder habits. She's had no weight loss or weight gain. She's had no cough. His been no rashes. She's had no leg swelling. We did check her erythropoietin level back in February. Her erythropoietin level was only 6.2. This is certainly on the lower side. However, I will think this is going to be an issue at this point.  Her last colonoscopy was back in 2007. I think that she can have another one in 2017.   Overall, her performance status is ECOG 1.  Medications:  Current outpatient prescriptions:  .  ALPRAZolam (XANAX) 1 MG tablet, One tablet by mouth 4 times a day as needed. Fill on or after May 4th., Disp: 120 tablet, Rfl: 1 .  ammonium lactate (LAC-HYDRIN) 12 % lotion, Apply 1 application topically as needed. , Disp: , Rfl: 0 .  busPIRone (BUSPAR) 15 MG tablet, Take 1 tablet (15 mg total) by mouth 3 (three) times daily., Disp: 90 tablet, Rfl: 6 .  butalbital-acetaminophen-caffeine (FIORICET, ESGIC) 50-325-40 MG per tablet, Take 1-2 tabs  as needed   And wean as directed., Disp: 10 tablet, Rfl: 0 .  Cholecalciferol (VITAMIN D3) 2000 UNITS TABS, Take 1 tablet by mouth daily., Disp: , Rfl:  .  latanoprost (XALATAN) 0.005 % ophthalmic solution, Place 1 drop into both eyes at bedtime. , Disp: , Rfl: 3 .  lisinopril-hydrochlorothiazide (PRINZIDE,ZESTORETIC) 20-12.5 MG per tablet, Take 1 tablet by mouth daily. For hypertension, Disp: 90 tablet, Rfl: 3 .  tretinoin (RETIN-A) 0.1 % cream, Apply 1 application topically at  bedtime. , Disp: , Rfl: 3  Allergies: No Known Allergies  Past Medical History, Surgical history, Social history, and Family History were reviewed and updated.  Review of Systems: As above  Physical Exam:  weight is 154 lb (69.854 kg). Her oral temperature is 98 F (36.7 C). Her blood pressure is 106/62 and her pulse is 71. Her respiration is 20.   Well-developed and well-nourished white female in no obvious distress. Head and neck exam shows no ocular or oral lesions. There are no palpable cervical or supraclavicular lymph nodes. Lungs are clear. Cardiac exam regular rate and rhythm with no murmurs, rubs or bruits. Abdomen is soft. She has good bowel sounds. There is no fluid wave. There is no palpable liver or spleen tip. Back exam shows no tenderness over the spine, ribs or hips. Extremities shows no clubbing, cyanosis or edema. Neurological exam shows no focal neurological deficits. Skin exam shows no rashes, ecchymoses or petechia.  Lab Results  Component Value Date   WBC 5.1 03/04/2015   HGB 11.9 03/04/2015   HCT 34.8 03/04/2015   MCV 98 03/04/2015   PLT 258 03/04/2015     Chemistry      Component Value Date/Time   NA 137 02/12/2015 1157   K 4.0 02/12/2015 1157   CL 100 02/12/2015 1157   CO2 33* 02/12/2015 1157   BUN 21 02/12/2015 1157   CREATININE 1.02 02/12/2015 1157  Component Value Date/Time   CALCIUM 9.7 02/12/2015 1157   ALKPHOS 59 12/31/2014 0943   AST 21 12/31/2014 0943   ALT 19 12/31/2014 0943   BILITOT 0.3 12/31/2014 0943         Impression and Plan: Brooke Pennington is 64 year old white female. She has responded to IV iron. She is not anemic now.  She's had no obvious bleeding. We talked about a colonoscopy. I don't think she probably needs 1 until she is due for one next year.  I think we can probably let her go from the clinic now. I told her we always can see her back if she feels that her blood is dropping or her iron is dropping.  It is always fun  talking to she and her husband.   Josph Macho, MD 6/8/201611:59 AM

## 2015-03-05 ENCOUNTER — Telehealth: Payer: Self-pay | Admitting: *Deleted

## 2015-03-05 NOTE — Telephone Encounter (Addendum)
Patient is aware of results.   ----- Message from Josph Macho, MD sent at 03/04/2015  5:45 PM EDT ----- Call and let her know that the iron levels are perfect. Thanks

## 2015-03-10 ENCOUNTER — Telehealth: Payer: Self-pay | Admitting: Internal Medicine

## 2015-03-10 NOTE — Telephone Encounter (Signed)
Pt request refill butalbital-acetaminophen-caffeine (FIORICET, ESGIC) 50-325-40 MG per tablet [facility]

## 2015-03-11 ENCOUNTER — Encounter: Payer: Self-pay | Admitting: Internal Medicine

## 2015-03-11 MED ORDER — BUTALBITAL-APAP-CAFFEINE 50-325-40 MG PO TABS: ORAL_TABLET | ORAL | Status: DC

## 2015-03-11 NOTE — Telephone Encounter (Signed)
Called to the pharmacy and left on machine. 

## 2015-03-11 NOTE — Telephone Encounter (Signed)
Ok x 1

## 2015-03-31 ENCOUNTER — Telehealth: Payer: Self-pay | Admitting: Internal Medicine

## 2015-03-31 NOTE — Telephone Encounter (Addendum)
Pt request refill ALPRAZolam (XANAX) 1 MG tablet Pt has no more refills [facility]

## 2015-04-01 MED ORDER — ALPRAZOLAM 1 MG PO TABS
ORAL_TABLET | ORAL | Status: DC
Start: 2015-04-01 — End: 2015-05-20

## 2015-04-01 NOTE — Telephone Encounter (Signed)
Called to the pharmacy and left on machine. 

## 2015-04-01 NOTE — Telephone Encounter (Signed)
Ok refill x2

## 2015-04-01 NOTE — Telephone Encounter (Signed)
Tried to call to the pharmacy.  Pharmacy line picks up but does not seem to connect.  Will try again at a later time.

## 2015-04-08 ENCOUNTER — Telehealth: Payer: Self-pay | Admitting: Internal Medicine

## 2015-04-08 NOTE — Telephone Encounter (Addendum)
Pt request refill butalbital-acetaminophen-caffeine (FIORICET, ESGIC) 50-325-40 MG per tablet AND busPIRone (BUSPAR) 15 MG tablet [facility] [place]

## 2015-04-08 NOTE — Telephone Encounter (Signed)
WP gives 10 tablets once monthly

## 2015-04-08 NOTE — Telephone Encounter (Signed)
Pt states Friday will be one month for the butalbital-acetaminophen-caffeine (FIORICET, ESGIC) 50-325-40 MG per tablet And the Buspar is due now . Pt would like to know if you can call those in thurs so she can pu at the same time. Thanks!

## 2015-04-08 NOTE — Telephone Encounter (Signed)
Call in #10 Fioricet and #90 Buspar

## 2015-04-09 MED ORDER — BUTALBITAL-APAP-CAFFEINE 50-325-40 MG PO TABS: ORAL_TABLET | ORAL | Status: DC

## 2015-04-09 MED ORDER — BUSPIRONE HCL 15 MG PO TABS: 15.0000 mg | ORAL_TABLET | Freq: Three times a day (TID) | ORAL | Status: DC

## 2015-04-09 NOTE — Telephone Encounter (Signed)
Fioricet called to the pharmacy and left on machine.  Buspar sent by e-scribe.

## 2015-04-28 ENCOUNTER — Telehealth: Payer: Self-pay | Admitting: Internal Medicine

## 2015-04-28 NOTE — Telephone Encounter (Signed)
Pt was instructed to reduce her lisinopril-hydrochlorothiazide (PRINZIDE,ZESTORETIC) 20-12.5 MG per tablet To 1/2 tab.  Now it is due for a refill and pt would like to know if she should stay at 1/2 tab ?  Pt needs that called [facility]  [facility] [place]

## 2015-04-29 NOTE — Telephone Encounter (Signed)
Left a message for a return call.

## 2015-04-30 NOTE — Telephone Encounter (Signed)
Left a message informing the pt that I need to know what her bp has been running.  Cannot answer her questions without that information.

## 2015-04-30 NOTE — Telephone Encounter (Signed)
Pt states she does not check her bp at home.  Last time it was checked was when she was at dr ennever's office 6/8. At it was good then.

## 2015-05-01 NOTE — Telephone Encounter (Signed)
Stay on 1/2 until she comes in to see [place] end of month. Will check her bp then. Please refill  The medicatioin for another month to get her through

## 2015-05-04 ENCOUNTER — Telehealth: Payer: Self-pay | Admitting: Internal Medicine

## 2015-05-04 DIAGNOSIS — Z79899 Other long term (current) drug therapy: Secondary | ICD-10-CM

## 2015-05-04 DIAGNOSIS — I10 Essential (primary) hypertension: Secondary | ICD-10-CM

## 2015-05-04 DIAGNOSIS — F411 Generalized anxiety disorder: Secondary | ICD-10-CM

## 2015-05-04 NOTE — Telephone Encounter (Signed)
Pt does need refill on lisinopril-hydrochlorothiazide (PRINZIDE,ZESTORETIC) 20-12.5 MG per tablet  Also states butalbital-acetaminophen-caffeine (FIORICET, ESGIC) 50-325-40 MG per  And busPIRone (BUSPAR) 15 MG tablet Due 8/14, but would like to pu Friday 8/12 w/ the lisinopril [facility]

## 2015-05-04 NOTE — Telephone Encounter (Signed)
Pt is given #10 Fioricet monthly.  Please advise.  Thanks! Last filled on 04/09/15

## 2015-05-04 NOTE — Telephone Encounter (Signed)
Refill once 

## 2015-05-04 NOTE — Telephone Encounter (Signed)
Left the information on patient's voicemail.  Advised her to call if she needs a refills.

## 2015-05-06 ENCOUNTER — Telehealth: Payer: Self-pay | Admitting: Internal Medicine

## 2015-05-06 MED ORDER — LISINOPRIL-HYDROCHLOROTHIAZIDE 20-12.5 MG PO TABS: 1.0000 | ORAL_TABLET | Freq: Every day | ORAL | Status: DC

## 2015-05-06 MED ORDER — BUTALBITAL-APAP-CAFFEINE 50-325-40 MG PO TABS: ORAL_TABLET | ORAL | Status: DC

## 2015-05-06 MED ORDER — BUSPIRONE HCL 15 MG PO TABS: 15.0000 mg | ORAL_TABLET | Freq: Three times a day (TID) | ORAL | Status: DC

## 2015-05-06 NOTE — Telephone Encounter (Signed)
Pt is just following up to see if her refills has been called in . Would like a call back

## 2015-05-06 NOTE — Telephone Encounter (Signed)
Percival Spanish at 05/06/2015 11:15 AM     Status: Signed       Expand All Collapse All   Pt is just following up to see if her refills has been called in . Would like a call back        Medications have been sent to the pharmacy.  Left a message on patient's home/cell informing her to pick up rx at the pharamacy.

## 2015-05-06 NOTE — Telephone Encounter (Signed)
Duplicate message. 

## 2015-05-12 ENCOUNTER — Telehealth: Payer: Self-pay | Admitting: Internal Medicine

## 2015-05-12 NOTE — Telephone Encounter (Signed)
Pt wants to know if she needs to do drug screening when she comes in for labs on thurs?

## 2015-05-12 NOTE — Telephone Encounter (Signed)
Not due until   September October 2016

## 2015-05-13 NOTE — Telephone Encounter (Signed)
Pt aware.

## 2015-05-14 ENCOUNTER — Other Ambulatory Visit (INDEPENDENT_AMBULATORY_CARE_PROVIDER_SITE_OTHER): Payer: BLUE CROSS/BLUE SHIELD

## 2015-05-14 DIAGNOSIS — Z79899 Other long term (current) drug therapy: Secondary | ICD-10-CM

## 2015-05-14 DIAGNOSIS — I10 Essential (primary) hypertension: Secondary | ICD-10-CM

## 2015-05-14 LAB — BASIC METABOLIC PANEL
BUN: 29 mg/dL — ABNORMAL HIGH (ref 6–23)
CALCIUM: 9.6 mg/dL (ref 8.4–10.5)
CO2: 33 meq/L — AB (ref 19–32)
Chloride: 102 mEq/L (ref 96–112)
Creatinine, Ser: 1.21 mg/dL — ABNORMAL HIGH (ref 0.40–1.20)
GFR: 47.9 mL/min — ABNORMAL LOW (ref >60.00)
GLUCOSE: 81 mg/dL (ref 70–99)
Potassium: 4.8 mEq/L (ref 3.5–5.1)
Sodium: 140 mEq/L (ref 135–145)

## 2015-05-20 ENCOUNTER — Ambulatory Visit (INDEPENDENT_AMBULATORY_CARE_PROVIDER_SITE_OTHER): Payer: BLUE CROSS/BLUE SHIELD | Admitting: Internal Medicine

## 2015-05-20 ENCOUNTER — Encounter: Payer: Self-pay | Admitting: Internal Medicine

## 2015-05-20 VITALS — BP 118/64 | Temp 98.5°F | Wt 157.3 lb

## 2015-05-20 DIAGNOSIS — Z79899 Other long term (current) drug therapy: Secondary | ICD-10-CM | POA: Diagnosis not present

## 2015-05-20 DIAGNOSIS — I10 Essential (primary) hypertension: Secondary | ICD-10-CM | POA: Diagnosis not present

## 2015-05-20 DIAGNOSIS — R748 Abnormal levels of other serum enzymes: Secondary | ICD-10-CM

## 2015-05-20 DIAGNOSIS — R7989 Other specified abnormal findings of blood chemistry: Secondary | ICD-10-CM

## 2015-05-20 DIAGNOSIS — Z2821 Immunization not carried out because of patient refusal: Secondary | ICD-10-CM

## 2015-05-20 DIAGNOSIS — F411 Generalized anxiety disorder: Secondary | ICD-10-CM

## 2015-05-20 MED ORDER — ALPRAZOLAM 1 MG PO TABS: ORAL_TABLET | ORAL | Status: DC

## 2015-05-20 MED ORDER — BUTALBITAL-APAP-CAFFEINE 50-325-40 MG PO TABS: ORAL_TABLET | ORAL | Status: DC

## 2015-05-20 NOTE — Patient Instructions (Addendum)
Try  1/2 dose of alprazolam  on a given  Dose .  To get gyne and mammo   . Stay on 1/2 pill of blood pressure medicine.  ROV in November     Bmp  Urinalysis and tox screen before visit .    Continue exercise  As  You are doing  .

## 2015-05-20 NOTE — Progress Notes (Signed)
Pre visit review using our clinic review tool, if applicable. No additional management support is needed unless otherwise documented below in the visit note.  Chief Complaint  Patient presents with  . Follow-up    meds  . Hypertension    HPI: Brooke Pennington 64 y.o. comes in for fu Brooke Pennington  comes in today for follow up of  multiple medical problems.   aAnxiety :  lpraz  1 4  X per day. ocass dec to 3 per day and feels anxiety  Tries to de 3 and often need   Taking the busprione .   Still using the  10 of fioricet .  Doing less caffiene .   Otherwise . Small amount   1/2  Cup. Doing better   Take gnc  vitmin  160-00 iu dc and 500 calcium disc with dr E also   BPTaking  1/2 tablet .    No se no dizziness or syncope   Some days are better than others .     Still works with grieving     Began treadmill.  Helps .   Other signals  ... Help her feel better .     Dr E gave her iron infusion and she feels better   epo was not elevated  Feels fine to be on  Prn basis and no need to move up colon from next year.  ROS: See pertinent positives and negatives per HPI.  Past Medical History  Diagnosis Date  . Eczema   . Headache(784.0)   . Hypertension   . Menopause     age 70 with sx no hrt  . Anal fissure     hx of same  . H/O rectal polypectomy     ?  uncertain  if colonic or rectal colonsocopy 8 2007 gi [place]  . Lymphocytic gastritis     neg SBBX for celiac  in 8 2007   . PMR (polymyalgia rheumatica)     Possible diagnoses by primary care physician with her anemia and elevated sedimentation rate of 60 was on prednisone but stopped it herself and then felt fine  . Medication management failed tox screen 11/27/2013    Neg for xanax and butalbital on urine screen . 2 /15   . Dependence, barbiturates ? 05/01/2013    Have come to the conclusion that she is dependent on this medication based on her history and the fact that she feels better when she takes it     Family  History  Problem Relation Age of Onset  . Coronary artery disease Father   . Heart disease Father   . COPD Mother   . Anemia Sister     needing transfusion some> vit b12 ?    Social History   Social History  . Marital Status: Married    Spouse Name: N/A  . Number of Children: N/A  . Years of Education: N/A   Social History Main Topics  . Smoking status: Never Smoker   . Smokeless tobacco: Never Used     Comment: never used tobacco  . Alcohol Use: 0.6 oz/week    1 Glasses of wine per week  . Drug Use: No  . Sexual Activity: Yes   Other Topics Concern  . 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  Has smoke detector and wears seat belts.  No firearms. No excess sun exposure. Sees dentist regularly . No depression      Only 1 car at this time   No etoh   Father passed dec 23 15 chf heart related age 65          Outpatient Prescriptions Prior to Visit  Medication Sig Dispense Refill  . ammonium lactate (LAC-HYDRIN) 12 % lotion Apply 1 application topically as needed.   0  . busPIRone (BUSPAR) 15 MG tablet Take 1 tablet (15 mg total) by mouth 3 (three) times daily. 90 tablet 0  . Cholecalciferol (VITAMIN D3) 2000 UNITS TABS Take 1 tablet by mouth daily.    Marland Kitchen latanoprost (XALATAN) 0.005 % ophthalmic solution Place 1 drop into both eyes at bedtime.   3  . lisinopril-hydrochlorothiazide (PRINZIDE,ZESTORETIC) 20-12.5 MG per tablet Take 1 tablet by mouth daily. For hypertension 90 tablet 0  . tretinoin (RETIN-A) 0.1 % cream Apply 1 application topically at bedtime.   3  . ALPRAZolam (XANAX) 1 MG tablet One tablet by mouth 4 times a day as needed. 120 tablet 1  . butalbital-acetaminophen-caffeine (FIORICET, ESGIC) 50-325-40 MG per tablet Take 1-2 tabs  as needed   And wean as directed. 10 tablet 0   No facility-administered medications prior to visit.      EXAM:  BP 118/64 mmHg  Temp(Src) 98.5 F (36.9 C) (Oral)  Wt 157 lb 4.8 oz (71.351 kg)  Body mass index is 27.87 kg/(m^2).  GENERAL: vitals reviewed and listed above, alert, oriented, appears well hydrated and in no acute distress less anxious than in past  Looks well  HEENT: atraumatic, conjunctiva  clear, no obvious abnormalities on inspection of external nose and ears NECK: no obvious masses on inspection palpation  LUNGS: clear to auscultation bilaterally, no wheezes, rales or rhonchi,  CV: HRRR, no clubbing cyanosis or  peripheral edema nl cap refill  Abdomen:  Sof,t normal bowel sounds without hepatosplenomegaly, no guarding rebound or masses no CVA tenderness MS: moves all extremities without noticeable focal  Abnormality   Skin: normal capillary refill ,turgor , color: No acute rashes ,petechiae or bruising PSYCH: pleasant and cooperative, no obvious depression or less anxiety  No tremor Lab Results  Component Value Date   WBC 5.1 03/04/2015   HGB 11.9 03/04/2015   HCT 34.8 03/04/2015   PLT 258 03/04/2015   GLUCOSE 81 05/14/2015   CHOL 240* 12/31/2014   TRIG 91.0 12/31/2014   HDL 58.60 12/31/2014   LDLDIRECT 168.2 12/25/2012   LDLCALC 163* 12/31/2014   ALT 19 12/31/2014   AST 21 12/31/2014   NA 140 05/14/2015   K 4.8 05/14/2015   CL 102 05/14/2015   CREATININE 1.21* 05/14/2015   BUN 29* 05/14/2015   CO2 33* 05/14/2015   TSH 2.30 12/31/2014    ASSESSMENT AND PLAN:  Discussed the following assessment and plan:  HYPERTENSION, BENIGN - good todaystay on low dose for now  Anxiety state - on ogin interim greiving seems  to be progressing   High risk medication use - stable at this time still want her to minimize the fioricet disc   Elevated serum creatinine - poss slight bump with the  ace   will continue now repeat in 4 months and consider less med if needed.   Influenza vaccination declined Total visit > 50% spent counseling and coordinating care  as indicated in above note and in instructions to patient .   -Patient  advised to return or notify health care team  if symptoms worsen ,persist or new concerns arise.  Patient Instructions  Try  1/2 dose of alprazolam  on a given  Dose .  To get gyne and mammo   . Stay on 1/2 pill of blood pressure medicine.  ROV in November     Bmp  Urinalysis and tox screen before visit .    Continue exercise  As  You are doing  .       Neta Mends. Leon Montoya M.D.  Impression and Plan: dr E Ms. Pequignot is 64 year old white female. She has responded to IV iron. She is not anemic now.  She's had no obvious bleeding. We talked about a colonoscopy. I don't think she probably needs 1 until she is due for one next year.  I think we can probably let her go from the clinic now. I told her we always can see her back if she feels that her blood is dropping or her iron is dropping.  It is always fun talking to she and her husband.   Josph Macho, MD 6/8/201611:59 AM

## 2015-06-03 ENCOUNTER — Telehealth: Payer: Self-pay | Admitting: Internal Medicine

## 2015-06-03 NOTE — Telephone Encounter (Signed)
Can do this one time  But cannot do this if looses again

## 2015-06-03 NOTE — Telephone Encounter (Signed)
Pt states she went to visit her sister and left her butalbital-acetaminophen-caffeine (FIORICET, ESGIC) 50-325-40 MG per tablet At her house. Pt is reluctant to have it mailed and wants to know if dr Fabian Sharp will refill for her? [facility] [address]

## 2015-06-04 MED ORDER — BUTALBITAL-APAP-CAFFEINE 50-325-40 MG PO TABS: ORAL_TABLET | ORAL | Status: DC

## 2015-06-04 NOTE — Telephone Encounter (Signed)
Called to the pharmacy and left on machine.  Left a message informing the pt.

## 2015-06-04 NOTE — Telephone Encounter (Signed)
Pt wanted me tp sincerely thank you for this. Pt states she will be more carefull , but is very appreciative!

## 2015-06-08 ENCOUNTER — Telehealth: Payer: Self-pay | Admitting: Internal Medicine

## 2015-06-08 NOTE — Telephone Encounter (Signed)
Pt request refill busPIRone (BUSPAR) 15 MG tablet [facility]

## 2015-06-09 MED ORDER — BUSPIRONE HCL 15 MG PO TABS: 15.0000 mg | ORAL_TABLET | Freq: Three times a day (TID) | ORAL | Status: DC

## 2015-06-09 NOTE — Telephone Encounter (Signed)
Sent to the pharmacy by e-scribe. 

## 2015-06-29 ENCOUNTER — Telehealth: Payer: Self-pay | Admitting: Internal Medicine

## 2015-06-29 NOTE — Telephone Encounter (Signed)
Pt request refill butalbital-acetaminophen-caffeine (FIORICET, ESGIC) 50-325-40 MG per tablet [facility]

## 2015-06-30 MED ORDER — BUTALBITAL-APAP-CAFFEINE 50-325-40 MG PO TABS: ORAL_TABLET | ORAL | Status: DC

## 2015-06-30 NOTE — Telephone Encounter (Signed)
Called to the pharmacy and left on machine. 

## 2015-06-30 NOTE — Telephone Encounter (Signed)
Ok x 1

## 2015-07-27 ENCOUNTER — Telehealth: Payer: Self-pay | Admitting: Internal Medicine

## 2015-07-27 MED ORDER — BUTALBITAL-APAP-CAFFEINE 50-325-40 MG PO TABS: ORAL_TABLET | ORAL | Status: DC

## 2015-07-27 NOTE — Telephone Encounter (Signed)
Called to the pharmacy and left on machine. 

## 2015-07-27 NOTE — Telephone Encounter (Signed)
Ok x 1

## 2015-07-27 NOTE — Telephone Encounter (Signed)
Pt request refill butalbital-acetaminophen-caffeine (FIORICET, ESGIC) 50-325-40 MG tablet  [facility]

## 2015-08-05 ENCOUNTER — Other Ambulatory Visit (INDEPENDENT_AMBULATORY_CARE_PROVIDER_SITE_OTHER): Payer: BLUE CROSS/BLUE SHIELD

## 2015-08-05 DIAGNOSIS — R300 Dysuria: Secondary | ICD-10-CM | POA: Diagnosis not present

## 2015-08-05 DIAGNOSIS — I10 Essential (primary) hypertension: Secondary | ICD-10-CM

## 2015-08-05 LAB — POCT URINALYSIS DIPSTICK
Bilirubin, UA: NEGATIVE
Blood, UA: NEGATIVE
Glucose, UA: NEGATIVE
KETONES UA: NEGATIVE
Nitrite, UA: NEGATIVE
PH UA: 5.5
PROTEIN UA: NEGATIVE
SPEC GRAV UA: 1.025
UROBILINOGEN UA: 0.2

## 2015-08-05 LAB — BASIC METABOLIC PANEL
BUN: 22 mg/dL (ref 6–23)
CO2: 28 mEq/L (ref 19–32)
Calcium: 9.7 mg/dL (ref 8.4–10.5)
Chloride: 102 mEq/L (ref 96–112)
Creatinine, Ser: 1.16 mg/dL (ref 0.40–1.20)
GFR: 50.25 mL/min — AB (ref >60.00)
Glucose, Bld: 84 mg/dL (ref 70–99)
POTASSIUM: 5 meq/L (ref 3.5–5.1)
SODIUM: 139 meq/L (ref 135–145)

## 2015-08-06 ENCOUNTER — Telehealth: Payer: Self-pay | Admitting: Internal Medicine

## 2015-08-06 NOTE — Telephone Encounter (Signed)
Called the pt and left a message on voicemail informing her that the medication cannot be filled at this time.  Advised her to call back if having problems.

## 2015-08-06 NOTE — Telephone Encounter (Signed)
This medication  Is not usually for stress as we discussed in past .  Can   rx 5 extra pills this month   Then can refill  The 10 Nov 21 or after .

## 2015-08-06 NOTE — Telephone Encounter (Signed)
Pt request refill butalbital-acetaminophen-caffeine (FIORICET, ESGIC) 50-325-40 MG tablet  [facility] [place]

## 2015-08-06 NOTE — Telephone Encounter (Signed)
Pt called back.  Stated she is having some family trouble with an out of town relative.  Brooke Pennington states she is feeling more stress due to the situation and would like [facility] to make an exception and fill medication early.  Pt is NOT going out of town. Please advise.  Thanks!

## 2015-08-06 NOTE — Telephone Encounter (Signed)
Too early to refill Can refill nov 21 or later

## 2015-08-06 NOTE — Telephone Encounter (Signed)
Last Filled on 07/27/2015

## 2015-08-07 MED ORDER — BUTALBITAL-APAP-CAFFEINE 50-325-40 MG PO TABS: ORAL_TABLET | ORAL | Status: DC

## 2015-08-07 NOTE — Addendum Note (Signed)
Addended by: Raj JanusADKINS, MISTY T on: 08/07/2015 01:12 PM   Modules accepted: Orders

## 2015-08-07 NOTE — Telephone Encounter (Signed)
Medication called to the pharmacy.  Left a message on cell informing the pt of all.

## 2015-08-17 ENCOUNTER — Other Ambulatory Visit: Payer: BLUE CROSS/BLUE SHIELD

## 2015-08-17 ENCOUNTER — Telehealth: Payer: Self-pay | Admitting: Internal Medicine

## 2015-08-17 ENCOUNTER — Ambulatory Visit: Payer: BLUE CROSS/BLUE SHIELD | Admitting: Internal Medicine

## 2015-08-17 NOTE — Telephone Encounter (Signed)
Ok to refill alprazalam x 1   No on the esgic  She has appt  Next week   Given 5 extra this month  Can refill at her appt.

## 2015-08-17 NOTE — Telephone Encounter (Signed)
Pt request refill of the following: ALPRAZolam (XANAX) 1 MG tablet,  butalbital-acetaminophen-caffeine (FIORICET, ESGIC) 50-325-40 MG tablet   [id number] [place] [place] - [address]

## 2015-08-18 MED ORDER — ALPRAZOLAM 1 MG PO TABS: ORAL_TABLET | ORAL | Status: DC

## 2015-08-18 NOTE — Telephone Encounter (Signed)
Xanax called to the pharmacy.  Left a message on voicemail informing her that esgic will be filled when she comes in for OV.

## 2015-08-24 ENCOUNTER — Ambulatory Visit: Payer: BLUE CROSS/BLUE SHIELD | Admitting: Internal Medicine

## 2015-08-25 ENCOUNTER — Ambulatory Visit (INDEPENDENT_AMBULATORY_CARE_PROVIDER_SITE_OTHER): Payer: BLUE CROSS/BLUE SHIELD | Admitting: Internal Medicine

## 2015-08-25 ENCOUNTER — Encounter: Payer: Self-pay | Admitting: Internal Medicine

## 2015-08-25 VITALS — BP 130/80 | Temp 98.2°F | Wt 166.1 lb

## 2015-08-25 DIAGNOSIS — Z79899 Other long term (current) drug therapy: Secondary | ICD-10-CM | POA: Diagnosis not present

## 2015-08-25 DIAGNOSIS — F411 Generalized anxiety disorder: Secondary | ICD-10-CM | POA: Diagnosis not present

## 2015-08-25 DIAGNOSIS — I10 Essential (primary) hypertension: Secondary | ICD-10-CM

## 2015-08-25 DIAGNOSIS — R748 Abnormal levels of other serum enzymes: Secondary | ICD-10-CM | POA: Diagnosis not present

## 2015-08-25 DIAGNOSIS — R7989 Other specified abnormal findings of blood chemistry: Secondary | ICD-10-CM

## 2015-08-25 MED ORDER — BUTALBITAL-APAP-CAFFEINE 50-325-40 MG PO TABS: ORAL_TABLET | ORAL | Status: DC

## 2015-08-25 MED ORDER — ALPRAZOLAM 1 MG PO TABS: ORAL_TABLET | ORAL | Status: DC

## 2015-08-25 MED ORDER — LISINOPRIL-HYDROCHLOROTHIAZIDE 20-12.5 MG PO TABS: 0.5000 | ORAL_TABLET | Freq: Every day | ORAL | Status: DC

## 2015-08-25 NOTE — Assessment & Plan Note (Signed)
Stable    At this time on 1/2 dose acei

## 2015-08-25 NOTE — Patient Instructions (Signed)
Let us   Know when you want a referral for colonoscopy.  Limit the   butabital as we discussed . Stay on same 1/2 pilllow  of  bp medication as discussed .   ROV in 4 months  Or as needed

## 2015-08-25 NOTE — Progress Notes (Signed)
Pre visit review using our clinic review tool, if applicable. No additional management support is needed unless otherwise documented below in the visit note.  Chief Complaint  Patient presents with  . Follow-up    HPI: Brooke Pennington 64 y.o.  comesin for fu meds and bp  BP:   taking  1/2 pill  Lis hctx seems ok  Had labs Anniversary of fathers death  Coming up right pre x mass  sisters illness and hospitalization . And issues.  Asked for extra  butabital 5  Stress and ha  Coping aware not No bleeding  Due for colon next year will call for this .   ROS: See pertinent positives and negatives per HPI.  Past Medical History  Diagnosis Date  . Eczema   . Headache(784.0)   . Hypertension   . Menopause     age 64 with sx no hrt  . Anal fissure     hx of same  . H/O rectal polypectomy     ?  uncertain  if colonic or rectal colonsocopy 8 2007 gi [place]  . Lymphocytic gastritis     neg SBBX for celiac  in 8 2007   . PMR (polymyalgia rheumatica) ([facility]CC)     Possible diagnoses by primary care physician with her anemia and elevated sedimentation rate of 60 was on prednisone but stopped it herself and then felt fine  . Medication management failed tox screen 11/27/2013    Neg for xanax and butalbital on urine screen . 2 /15   . Dependence, barbiturates ? 05/01/2013    Have come to the conclusion that she is dependent on this medication based on her history and the fact that she feels better when she takes it     Family History  Problem Relation Age of Onset  . Coronary artery disease Father   . Heart disease Father   . COPD Mother   . Anemia Sister     needing transfusion some> vit b12 ?    Social History   Social History  . Marital Status: Married    Spouse Name: N/A  . Number of Children: N/A  . Years of Education: N/A   Social History Main Topics  . Smoking status: Never Smoker   . Smokeless tobacco: Never Used     Comment: never used tobacco  . Alcohol Use: 0.6  oz/week    1 Glasses of wine per week  . Drug Use: No  . Sexual Activity: Yes   Other Topics Concern  . None   Social History Narrative   Married remarried   he is self-employed   Therapist, [employer] 2    College degree worked at 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  Has smoke detector and wears seat belts.  No firearms. No excess sun exposure. Sees dentist regularly . No depression      Only 1 car at this time   No etoh   Father passed dec 23 15 chf heart related age 62          Outpatient Prescriptions Prior to Visit  Medication Sig Dispense Refill  . ammonium lactate (LAC-HYDRIN) 12 % lotion Apply 1 application topically as needed.   0  . busPIRone (BUSPAR) 15 MG tablet Take 1 tablet (15 mg total) by mouth 3 (three) times daily. 90 tablet 2  . latanoprost (XALATAN) 0.005 % ophthalmic solution Place 1 drop into both eyes at bedtime.   3  .  MULTIPLE VITAMIN PO Take 1 tablet by mouth daily.    Marland Kitchen tretinoin (RETIN-A) 0.1 % cream Apply 1 application topically at bedtime.   3  . ALPRAZolam (XANAX) 1 MG tablet One tablet by mouth 4 times a day as needed. 120 tablet 0  . butalbital-acetaminophen-caffeine (FIORICET, ESGIC) 50-325-40 MG tablet Take 1-2 tabs  as needed    Limit use to 10 per month 5 tablet 0  . lisinopril-hydrochlorothiazide (PRINZIDE,ZESTORETIC) 20-12.5 MG per tablet Take 1 tablet by mouth daily. For hypertension 90 tablet 0  . Cholecalciferol (VITAMIN D3) 2000 UNITS TABS Take 1 tablet by mouth daily.     No facility-administered medications prior to visit.     EXAM:  BP 130/80 mmHg  Temp(Src) 98.2 F (36.8 C) (Oral)  Wt 166 lb 1.6 oz (75.342 kg)  Body mass index is 29.43 kg/(m^2).  GENERAL: vitals reviewed and listed above, alert, oriented, appears well hydrated and in no acute distress HEENT: atraumatic, conjunctiva  clear, no obvious abnormalities on inspection of external nose and ears NECK: no obvious masses on inspection palpation  LUNGS: clear to auscultation  bilaterally, no wheezes, rales or rhonchi, good air movement CV: HRRR, no clubbing cyanosis or  peripheral edema nl cap refill  MS: moves all extremities without noticeable focal  abnormality PSYCH: pleasant and cooperative,  midlly anxious nl  Affect for her and good eye contact  Lab Results  Component Value Date   WBC 5.1 03/04/2015   HGB 11.9 03/04/2015   HCT 34.8 03/04/2015   PLT 258 03/04/2015   GLUCOSE 84 08/05/2015   CHOL 240* 12/31/2014   TRIG 91.0 12/31/2014   HDL 58.60 12/31/2014   LDLDIRECT 168.2 12/25/2012   LDLCALC 163* 12/31/2014   ALT 19 12/31/2014   AST 21 12/31/2014   NA 139 08/05/2015   K 5.0 08/05/2015   CL 102 08/05/2015   CREATININE 1.16 08/05/2015   BUN 22 08/05/2015   CO2 28 08/05/2015   TSH 2.30 12/31/2014   BP Readings from Last 3 Encounters:  08/25/15 130/80  05/20/15 118/64  03/04/15 106/62   Wt Readings from Last 3 Encounters:  08/25/15 166 lb 1.6 oz (75.342 kg)  05/20/15 157 lb 4.8 oz (71.351 kg)  03/04/15 154 lb (69.854 kg)   BP Readings from Last 3 Encounters:  08/25/15 130/80  05/20/15 118/64  03/04/15 106/62   Wt Readings from Last 3 Encounters:  08/25/15 166 lb 1.6 oz (75.342 kg)  05/20/15 157 lb 4.8 oz (71.351 kg)  03/04/15 154 lb (69.854 kg)     ASSESSMENT AND PLAN:  Discussed the following assessment and plan:  HYPERTENSION, BENIGN - ok on 1/2 tab continue  Anxiety state - stable external factors - Plan: lisinopril-hydrochlorothiazide (PRINZIDE,ZESTORETIC) 20-12.5 MG tablet  Elevated serum creatinine - improve  Medication management  High risk medication use - stable limit butabitaol to 10 per month xtra 5 this month still on alprazolam every day - Plan: lisinopril-hydrochlorothiazide (PRINZIDE,ZESTORETIC) 20-12.5 MG tablet Total visit > 50% spent counseling and coordinating care as indicated in above note and in instructions to patient .    regarding med   Asks if can rx 15 butabital for next month  Caution  with meds as discussed . Pt aware not to do regular .  Can cahnge to 6 months tox screen  Wellness and labs in April 17  -Patient advised to return or notify health care team  if symptoms worsen ,persist or new concerns arise.  Patient Instructions  Let us   Know when you want a referral for colonoscopy.  Limit the   butabital as we discussed . Stay on same 1/2 pilllow  of  bp medication as discussed .   ROV in 4 months  Or as needed    Burna Mortimer K. Truc Winfree M.D.

## 2015-09-03 ENCOUNTER — Other Ambulatory Visit: Payer: Self-pay | Admitting: Internal Medicine

## 2015-09-04 ENCOUNTER — Telehealth: Payer: Self-pay | Admitting: Internal Medicine

## 2015-09-04 NOTE — Telephone Encounter (Signed)
Sent to the pharmacy by e-scribe. 

## 2015-09-04 NOTE — Telephone Encounter (Signed)
Duplicate Request.

## 2015-09-04 NOTE — Telephone Encounter (Signed)
Pt needs refill on buspar 15 mg #90 w/[facility]ills [facility] [address]

## 2015-09-08 ENCOUNTER — Encounter: Payer: Self-pay | Admitting: Internal Medicine

## 2015-09-22 ENCOUNTER — Telehealth: Payer: Self-pay | Admitting: Internal Medicine

## 2015-09-22 NOTE — Telephone Encounter (Signed)
Ms. Brooke Pennington called asking if a refill of Butalbital can [facility]. She'd like the Rx to be for 15 tablets this time instead of 10 if possible. Please give her a call if needed.  Pt's ph# [phone] Thank you.

## 2015-09-22 NOTE — Telephone Encounter (Signed)
One more time only  Then go back down to 10 per month

## 2015-09-23 MED ORDER — BUTALBITAL-APAP-CAFFEINE 50-325-40 MG PO TABS: ORAL_TABLET | ORAL | Status: DC

## 2015-09-23 NOTE — Telephone Encounter (Signed)
Called to the pharmacy and left on machine. 

## 2015-10-19 ENCOUNTER — Telehealth: Payer: Self-pay | Admitting: Internal Medicine

## 2015-10-19 NOTE — Telephone Encounter (Signed)
Pt request refill butalbital-acetaminophen-caffeine (FIORICET, ESGIC) 50-325-40 MG tablet Pt would like to note Dr Fabian Sharp has increased to 15 tabs [facility] [place] [place]

## 2015-10-21 MED ORDER — BUTALBITAL-APAP-CAFFEINE 50-325-40 MG PO TABS: ORAL_TABLET | ORAL | Status: DC

## 2015-10-21 NOTE — Telephone Encounter (Signed)
Call in #10 only

## 2015-10-21 NOTE — Telephone Encounter (Signed)
Madelin Headings, MD at 09/22/2015 6:04 PM     Status: Signed       Expand All Collapse All   One more time only Then go back down to 10 per month           PATIENT SHOULD ONLY GET 10 PILLS.  LAST FILLED ON 09/23/2015

## 2015-10-21 NOTE — Telephone Encounter (Signed)
Called to the pharmacy and left on machine. 

## 2015-11-16 ENCOUNTER — Telehealth: Payer: Self-pay | Admitting: Internal Medicine

## 2015-11-16 DIAGNOSIS — F411 Generalized anxiety disorder: Secondary | ICD-10-CM

## 2015-11-16 DIAGNOSIS — Z79899 Other long term (current) drug therapy: Secondary | ICD-10-CM

## 2015-11-16 MED ORDER — BUTALBITAL-APAP-CAFFEINE 50-325-40 MG PO TABS: ORAL_TABLET | ORAL | Status: DC

## 2015-11-16 NOTE — Telephone Encounter (Signed)
Stay on 1/2 pill  bp medication  If her bp is still doing ok   If easier  we can change to 10 /12.5  Lisin/hctz  1 po qd refill x 6 months ( not exactly half but prob as good)   I would like her to  Go  back to 10 esgic per month.   Refill x 1  At this time

## 2015-11-16 NOTE — Telephone Encounter (Signed)
Fioricet called to the pharmacy and left on machine.  Left a message on patient's machine informing her to cal back if wants to change medication or bp not doing well.  Informed her in the message that she will be getting 10 tabs.

## 2015-11-16 NOTE — Telephone Encounter (Signed)
Pt request refill butalbital-acetaminophen-caffeine (FIORICET, ESGIC) 50-325-40 MG tablet Pt would like to note Dr Fabian Sharp has increased to 15 tabs  Again this month.  Also request  lisinopril-hydrochlorothiazide (PRINZIDE,ZESTORETIC) 20-12.5 MG tablet Pt takes 1/2 tab daily. Original rx was 1 X day  Pt wants to know if you want to change her rx or have her continue to cut in half. 90 tabs  [facility] [place] [place]

## 2015-11-17 MED ORDER — LISINOPRIL-HYDROCHLOROTHIAZIDE 20-12.5 MG PO TABS: 0.5000 | ORAL_TABLET | Freq: Every day | ORAL | Status: DC

## 2015-11-17 NOTE — Telephone Encounter (Signed)
Pt did not get her lisinopril-hydrochlorothiazide (PRINZIDE,ZESTORETIC) 20-12.5 MG tablet Pt aware to stay on 1/2 tablet for now. Pt stays she thinks she is doing ok on the 1/2 tab.  Also , pt did not get the refill as dr Fabian Sharp stated refill X 1.  [facility] [place] [address]

## 2015-11-17 NOTE — Addendum Note (Signed)
Addended by: Raj Janus T on: 11/17/2015 10:11 AM   Modules accepted: Orders

## 2015-11-17 NOTE — Telephone Encounter (Signed)
Sent to the pharmacy by e-scribe. 

## 2015-12-09 ENCOUNTER — Telehealth: Payer: Self-pay | Admitting: Internal Medicine

## 2015-12-09 NOTE — Telephone Encounter (Signed)
Pt [facility] a ref[facility]et [facility] [place]

## 2015-12-09 NOTE — Telephone Encounter (Signed)
Pt would like to know if its time for her to having toxicology drug screen test

## 2015-12-13 NOTE — Telephone Encounter (Signed)
Ok to refill x 1 disp 10   Due for tox screen in April .

## 2015-12-14 MED ORDER — BUTALBITAL-APAP-CAFFEINE 50-325-40 MG PO TABS: ORAL_TABLET | ORAL | Status: DC

## 2015-12-14 NOTE — Telephone Encounter (Signed)
Called to the pharmacy and left on machine. 

## 2015-12-14 NOTE — Telephone Encounter (Signed)
Pt due for urine tox screen in April.  Please help her to make appointment.  Thanks!

## 2015-12-25 ENCOUNTER — Telehealth: Payer: Self-pay | Admitting: Internal Medicine

## 2015-12-25 NOTE — Telephone Encounter (Signed)
Pt would like a written rx for alprazolam 1 mg #120 with refills. Pt  will pick up  rx on 12/30/15.

## 2015-12-25 NOTE — Telephone Encounter (Signed)
Pt is aware no appointment is needed

## 2015-12-25 NOTE — Telephone Encounter (Signed)
lmom for pt to call back

## 2015-12-28 MED ORDER — ALPRAZOLAM 1 MG PO TABS: ORAL_TABLET | ORAL | Status: DC

## 2015-12-28 NOTE — Telephone Encounter (Signed)
Refill x 3 

## 2015-12-28 NOTE — Telephone Encounter (Signed)
Done

## 2015-12-30 ENCOUNTER — Encounter: Payer: Self-pay | Admitting: Internal Medicine

## 2015-12-30 ENCOUNTER — Other Ambulatory Visit (INDEPENDENT_AMBULATORY_CARE_PROVIDER_SITE_OTHER): Payer: BLUE CROSS/BLUE SHIELD

## 2015-12-30 DIAGNOSIS — Z0000 Encounter for general adult medical examination without abnormal findings: Secondary | ICD-10-CM

## 2015-12-30 LAB — LIPID PANEL
CHOLESTEROL: 247 mg/dL — AB (ref 0–200)
HDL: 58.7 mg/dL (ref >39.00)
LDL Cholesterol: 165 mg/dL — ABNORMAL HIGH (ref 0–99)
NonHDL: 188.1
Total CHOL/HDL Ratio: 4
Triglycerides: 114 mg/dL (ref 0.0–149.0)
VLDL: 22.8 mg/dL (ref 0.0–40.0)

## 2015-12-30 LAB — BASIC METABOLIC PANEL
BUN: 30 mg/dL — ABNORMAL HIGH (ref 6–23)
CHLORIDE: 103 meq/L (ref 96–112)
CO2: 32 mEq/L (ref 19–32)
Calcium: 9.4 mg/dL (ref 8.4–10.5)
Creatinine, Ser: 1.21 mg/dL — ABNORMAL HIGH (ref 0.40–1.20)
GFR: 47.8 mL/min — AB (ref >60.00)
Glucose, Bld: 79 mg/dL (ref 70–99)
POTASSIUM: 4.5 meq/L (ref 3.5–5.1)
Sodium: 140 mEq/L (ref 135–145)

## 2015-12-30 LAB — HEPATIC FUNCTION PANEL
ALBUMIN: 4.1 g/dL (ref 3.5–5.2)
ALT: 19 U/L (ref 0–35)
AST: 21 U/L (ref 0–37)
Alkaline Phosphatase: 71 U/L (ref 39–117)
Bilirubin, Direct: 0.1 mg/dL (ref 0.0–0.3)
Total Bilirubin: 0.3 mg/dL (ref 0.2–1.2)
Total Protein: 6.4 g/dL (ref 6.0–8.3)

## 2015-12-30 LAB — CBC WITH DIFFERENTIAL/PLATELET
Basophils Absolute: 0 10*3/uL (ref 0.0–0.1)
Basophils Relative: 0.4 % (ref 0.0–3.0)
EOS PCT: 7.7 % — AB (ref 0.0–5.0)
Eosinophils Absolute: 0.5 10*3/uL (ref 0.0–0.7)
HEMATOCRIT: 33.9 % — AB (ref 36.0–46.0)
HEMOGLOBIN: 11.4 g/dL — AB (ref 12.0–15.0)
Lymphocytes Relative: 25.2 % (ref 12.0–46.0)
Lymphs Abs: 1.6 10*3/uL (ref 0.7–4.0)
MCHC: 33.5 g/dL (ref 30.0–36.0)
MCV: 95.1 fl (ref 78.0–100.0)
MONOS PCT: 6.4 % (ref 3.0–12.0)
Monocytes Absolute: 0.4 10*3/uL (ref 0.1–1.0)
Neutro Abs: 3.9 10*3/uL (ref 1.4–7.7)
Neutrophils Relative %: 60.3 % (ref 43.0–77.0)
Platelets: 267 10*3/uL (ref 150.0–400.0)
RBC: 3.57 Mil/uL — AB (ref 3.87–5.11)
RDW: 12.7 % (ref 11.5–15.5)
WBC: 6.4 10*3/uL (ref 4.0–10.5)

## 2015-12-30 LAB — TSH: TSH: 2.92 u[IU]/mL (ref 0.35–4.50)

## 2016-01-11 ENCOUNTER — Telehealth: Payer: Self-pay | Admitting: Internal Medicine

## 2016-01-11 NOTE — Telephone Encounter (Signed)
Pt request refill  butalbital-acetaminophen-caffeine (FIORICET, ESGIC) 50-325-40 MG tablet  [facility] [place] [place]

## 2016-01-11 NOTE — Telephone Encounter (Signed)
Ok x 1

## 2016-01-12 ENCOUNTER — Other Ambulatory Visit: Payer: BLUE CROSS/BLUE SHIELD

## 2016-01-12 MED ORDER — BUTALBITAL-APAP-CAFFEINE 50-325-40 MG PO TABS: ORAL_TABLET | ORAL | Status: DC

## 2016-01-12 NOTE — Telephone Encounter (Signed)
Called to the pharmacy and left on machine. 

## 2016-01-15 ENCOUNTER — Encounter: Payer: Self-pay | Admitting: Internal Medicine

## 2016-01-19 ENCOUNTER — Ambulatory Visit: Payer: BLUE CROSS/BLUE SHIELD | Admitting: Internal Medicine

## 2016-01-19 ENCOUNTER — Ambulatory Visit (INDEPENDENT_AMBULATORY_CARE_PROVIDER_SITE_OTHER): Payer: BLUE CROSS/BLUE SHIELD | Admitting: Internal Medicine

## 2016-01-19 VITALS — BP 122/70 | Temp 97.9°F | Ht 62.5 in | Wt 165.2 lb

## 2016-01-19 DIAGNOSIS — F411 Generalized anxiety disorder: Secondary | ICD-10-CM | POA: Diagnosis not present

## 2016-01-19 DIAGNOSIS — R748 Abnormal levels of other serum enzymes: Secondary | ICD-10-CM | POA: Diagnosis not present

## 2016-01-19 DIAGNOSIS — Z23 Encounter for immunization: Secondary | ICD-10-CM

## 2016-01-19 DIAGNOSIS — Z79899 Other long term (current) drug therapy: Secondary | ICD-10-CM | POA: Diagnosis not present

## 2016-01-19 DIAGNOSIS — Z0000 Encounter for general adult medical examination without abnormal findings: Secondary | ICD-10-CM

## 2016-01-19 DIAGNOSIS — D649 Anemia, unspecified: Secondary | ICD-10-CM

## 2016-01-19 DIAGNOSIS — R7989 Other specified abnormal findings of blood chemistry: Secondary | ICD-10-CM

## 2016-01-19 DIAGNOSIS — E559 Vitamin D deficiency, unspecified: Secondary | ICD-10-CM

## 2016-01-19 MED ORDER — BUTALBITAL-APAP-CAFFEINE 50-325-40 MG PO TABS: ORAL_TABLET | ORAL | Status: DC

## 2016-01-19 NOTE — Progress Notes (Signed)
Pre visit review using our clinic review tool, if applicable. No additional management support is needed unless otherwise documented below in the visit note.  Chief Complaint  Patient presents with  . Annual Exam    med check   . Hypertension    HPI: Patient  Brooke Pennington  65 y.o. comes in today for [place] visit  And med management  Mammogram and gyne done   And ok  Had dexa and sh was -1.7 hip -1.0 spine  Dr Beverly Gust  Still on alpraz daily no change  Not exercising as much asks if ok to do certain exercises. Asks to get her 10 fioricet  Now and skip may call   Call for buspar when needed?   Health Maintenance  Topic Date Due  . PAP SMEAR  09/26/2012  . ZOSTAVAX  03/16/2013  . INFLUENZA VACCINE  05/19/2016 (Originally 04/26/2016)  . Hepatitis C Screening  01/18/2017 (Originally [birth year])  . HIV Screening  02/16/2017 (Originally 03/16/1966)  . COLONOSCOPY  05/24/2016  . MAMMOGRAM  09/09/2017  . TETANUS/TDAP  01/18/2026   Health Maintenance Review LIFESTYLE:  Exercise:  Not a lot today Tobacco/ETS:n Alcohol: per day n Sugar beverages:n Sleep: Drug use: no    ROS:  GEN/ HEENT: No fever, significant weight changes sweats headaches vision problems hearing changes, CV/ PULM; No chest pain shortness of breath cough, syncope,edema  change in exercise tolerance. GI /GU: No adominal pain, vomiting, change in bowel habits. No blood in the stool. No significant GU symptoms. SKIN/HEME: ,no acute skin rashes suspicious lesions or bleeding. No lymphadenopathy, nodules, masses.  NEURO/ PSYCH:  No neurologic signs such as weakness numbness. No depression or increase in baseline  anxiety. IMM/ Allergy: No unusual infections.  Allergy .   REST of 12 system review negative except as per HPI   Past Medical History  Diagnosis Date  . Eczema   . Headache(784.0)   . Hypertension   . Menopause     age 64 with sx no hrt  . Anal fissure     hx of same  . H/O rectal  polypectomy     ?  uncertain  if colonic or rectal colonsocopy 8 2007 gi [place]  . Lymphocytic gastritis     neg SBBX for celiac  in 8 2007   . PMR (polymyalgia rheumatica) (HCC)     Possible diagnoses by primary care physician with her anemia and elevated sedimentation rate of 60 was on prednisone but stopped it herself and then felt fine  . Medication management failed tox screen 11/27/2013    Neg for xanax and butalbital on urine screen . 2 /15   . Dependence, barbiturates ? 05/01/2013    Have come to the conclusion that she is dependent on this medication based on her history and the fact that she feels better when she takes it     Past Surgical History  Procedure Laterality Date  . No past surgeries      Family History  Problem Relation Age of Onset  . Coronary artery disease Father   . Heart disease Father   . COPD Mother   . Anemia Sister     needing transfusion some> vit b12 ?    Social History   Social History  . Marital Status: Married    Spouse Name: N/A  . Number of Children: N/A  . Years of Education: N/A   Social History Main Topics  . Smoking status: Never Smoker   .  Smokeless tobacco: Never Used     Comment: never used tobacco  . Alcohol Use: 0.6 oz/week    1 Glasses of wine per week  . Drug Use: No  . Sexual Activity: Yes   Other Topics Concern  . 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  Has smoke detector and wears seat belts.  No firearms. No excess sun exposure. Sees dentist regularly . No depression      Only 1 car at this time   No etoh   Father passed dec 23 15 chf heart related age 73          Outpatient Prescriptions Prior to Visit  Medication Sig Dispense Refill  . ALPRAZolam (XANAX) 1 MG tablet One tablet by mouth 4 times a day as needed. 120 tablet 2  . ammonium lactate (LAC-HYDRIN) 12 % lotion Apply 1  application topically as needed.   0  . busPIRone (BUSPAR) 15 MG tablet TAKE 1 TABLET (15 MG TOTAL) BY MOUTH 3 (THREE) TIMES DAILY. 90 tablet 4  . latanoprost (XALATAN) 0.005 % ophthalmic solution Place 1 drop into both eyes at bedtime.   3  . lisinopril-hydrochlorothiazide (PRINZIDE,ZESTORETIC) 20-12.5 MG tablet Take 0.5 tablets by mouth daily. Or as directed For hypertension 90 tablet 0  . MULTIPLE VITAMIN PO Take 1 tablet by mouth daily.    Marland Kitchen tretinoin (RETIN-A) 0.1 % cream Apply 1 application topically at bedtime.   3  . butalbital-acetaminophen-caffeine (FIORICET, ESGIC) 50-325-40 MG tablet Take 1-2 tabs  as needed.  Limit Regular Use. 10 tablet 0   No facility-administered medications prior to visit.     EXAM:  BP 122/70 mmHg  Temp(Src) 97.9 F (36.6 C) (Oral)  Ht 5' 2.5" (1.588 m)  Wt 165 lb 3.2 oz (74.934 kg)  BMI 29.72 kg/m2  Body mass index is 29.72 kg/(m^2).  Physical Exam: Vital signs reviewed EQA:STMH is a well-developed well-nourished alert cooperative    who appearsr stated age in no acute distress.  HEENT: normocephalic atraumatic , Eyes: PERRL EOM's full, conjunctiva clear, Nares: paten,t no deformity discharge or tenderness., Ears: no deformity EAC's clear TMs with normal landmarks. Mouth: clear OP, no lesions, edema.  Moist mucous membranes. Dentition in adequate repair. NECK: supple without masses, thyromegaly or bruits. CHEST/PULM:  Clear to auscultation and percussion breath sounds equal no wheeze , rales or rhonchi. No chest wall deformities or tenderness. CV: PMI is nondisplaced, S1 S2 no gallops, murmurs, rubs. Peripheral pulses are full without delay.No JVD .  Breast  Exam done at gyne ABDOMEN: Bowel sounds normal nontender  No guard or rebound, no hepato splenomegal no CVA tenderness.  No hernia. Extremtities:  No clubbing cyanosis or edema, no acute joint swelling or redness no focal atrophy NEURO:  Oriented x3, cranial nerves 3-12 appear to be intact, no  obvious focal weakness,gait within normal limits no abnormal reflexes or asymmetrical SKIN: No acute rashes normal turgor, color, no bruising or petechiae. PSYCH: Oriented, good eye contact, no obvious depression baseline anxiety, cognition and judgment appear normal. LN: no cervical axillary inguinal adenopathy  Lab Results  Component Value Date   WBC 6.4 12/30/2015   HGB 11.4* 12/30/2015   HCT 33.9* 12/30/2015   PLT 267.0 12/30/2015   GLUCOSE 79 12/30/2015   CHOL 247* 12/30/2015   TRIG 114.0  12/30/2015   HDL 58.70 12/30/2015   LDLDIRECT 168.2 12/25/2012   LDLCALC 165* 12/30/2015   ALT 19 12/30/2015   AST 21 12/30/2015   NA 140 12/30/2015   K 4.5 12/30/2015   CL 103 12/30/2015   CREATININE 1.21* 12/30/2015   BUN 30* 12/30/2015   CO2 32 12/30/2015   TSH 2.92 12/30/2015   BP Readings from Last 3 Encounters:  01/19/16 122/70  08/25/15 130/80  05/20/15 118/64   Wt Readings from Last 3 Encounters:  01/19/16 165 lb 3.2 oz (74.934 kg)  08/25/15 166 lb 1.6 oz (75.342 kg)  05/20/15 157 lb 4.8 oz (71.351 kg)    ASSESSMENT AND PLAN:  Discussed the following assessment and plan:  Visit for preventive health examination  Elevated serum creatinine - 1.21  Medication management  Anxiety state - no chnage   Mild anemia  Vitamin D deficiency - last check a year ago 33 ok   Need for Tdap vaccination - Plan: Tdap vaccine greater than or equal to 7yo IM  Long term prescription benzodiazepine use 6 months  Reassured ok to exercise   As planned ) avoid injury)   options discussed  Follow creatinine  bp has been good and on acei Taking gnc 1600 vit d and 500 calcium  Will refill fioricet early  And nex rx will be  June  Next tox screen in 6 months Patient Care Team: Burnis Medin, MD as PCP - General (Internal Medicine) Pollard,Harold Volanda Napoleon, MD as Consulting Physician (Oncology) Patient Instructions  Exercise ok  Yoga treadmill walking and weights   Can help  heart  Health and  Blood pressure  And  Bone health .   ROV 6 months   Bmp cbcdiff and tox screen . Pre visit    Health Maintenance, Female Adopting a healthy lifestyle and getting preventive care can go a long way to promote health and wellness. Talk with your health care provider about what schedule of regular examinations is right for you. This is a good chance for you to check in with your provider about disease prevention and staying healthy. In between checkups, there are plenty of things you can do on your own. Experts have done a lot of research about which lifestyle changes and preventive measures are most likely to keep you healthy. Ask your health care provider for more information. WEIGHT AND DIET  Eat a healthy diet  Be sure to include plenty of vegetables, fruits, low-fat dairy products, and lean protein.  Do not eat a lot of foods high in solid fats, added sugars, or salt.  Get regular exercise. This is one of the most important things you can do for your health.  Most adults should exercise for at least 150 minutes each week. The exercise should increase your heart rate and make you sweat (moderate-intensity exercise).  Most adults should also do strengthening exercises at least twice a week. This is in addition to the moderate-intensity exercise.  Maintain a healthy weight  Body mass index (BMI) is a measurement that can be used to identify possible weight problems. It estimates body fat based on height and weight. Your health care provider can help determine your BMI and help you achieve or maintain a healthy weight.  For females 53 years of age and older:   A BMI below 18.5 is considered underweight.  A BMI of 18.5 to 24.9 is normal.  A BMI of 25 to 29.9 is considered overweight.  A BMI  of 30 and above is considered obese.  Watch levels of cholesterol and blood lipids  You should start having your blood tested for lipids and cholesterol at 63 years of age,  then have this test every 5 years.  You may need to have your cholesterol levels checked more often if:  Your lipid or cholesterol levels are high.  You are older than 63 years of age.  You are at high risk for heart disease.  CANCER SCREENING   Lung Cancer  Lung cancer screening is recommended for adults 5-43 years old who are at high risk for lung cancer because of a history of smoking.  A yearly low-dose CT scan of the lungs is recommended for people who:  Currently smoke.  Have quit within the past 15 years.  Have at least a 30-pack-year history of smoking. A pack year is smoking an average of one pack of cigarettes a day for 1 year.  Yearly screening should continue until it has been 15 years since you quit.  Yearly screening should stop if you develop a health problem that would prevent you from having lung cancer treatment.  Breast Cancer  Practice breast self-awareness. This means understanding how your breasts normally appear and feel.  It also means doing regular breast self-exams. Let your health care provider know about any changes, no matter how small.  If you are in your 20s or 30s, you should have a clinical breast exam (CBE) by a health care provider every 1-3 years as part of a regular health exam.  If you are 8 or older, have a CBE every year. Also consider having a breast X-ray (mammogram) every year.  If you have a family history of breast cancer, talk to your health care provider about genetic screening.  If you are at high risk for breast cancer, talk to your health care provider about having an MRI and a mammogram every year.  Breast cancer gene (BRCA) assessment is recommended for women who have family members with BRCA-related cancers. BRCA-related cancers include:  Breast.  Ovarian.  Tubal.  Peritoneal cancers.  Results of the assessment will determine the need for genetic counseling and BRCA1 and BRCA2 testing. Cervical Cancer Your  health care provider may recommend that you be screened regularly for cancer of the pelvic organs (ovaries, uterus, and vagina). This screening involves a pelvic examination, including checking for microscopic changes to the surface of your cervix (Pap test). You may be encouraged to have this screening done every 3 years, beginning at age 77.  For women ages 49-65, health care providers may recommend pelvic exams and Pap testing every 3 years, or they may recommend the Pap and pelvic exam, combined with testing for human papilloma virus (HPV), every 5 years. Some types of HPV increase your risk of cervical cancer. Testing for HPV may also be done on women of any age with unclear Pap test results.  Other health care providers may not recommend any screening for nonpregnant women who are considered low risk for pelvic cancer and who do not have symptoms. Ask your health care provider if a screening pelvic exam is right for you.  If you have had past treatment for cervical cancer or a condition that could lead to cancer, you need Pap tests and screening for cancer for at least 20 years after your treatment. If Pap tests have been discontinued, your risk factors (such as having a new sexual partner) need to be reassessed to determine if screening  should resume. Some women have medical problems that increase the chance of getting cervical cancer. In these cases, your health care provider may recommend more frequent screening and Pap tests. Colorectal Cancer  This type of cancer can be detected and often prevented.  Routine colorectal cancer screening usually begins at 63 years of age and continues through 63 years of age.  Your health care provider may recommend screening at an earlier age if you have risk factors for colon cancer.  Your health care provider may also recommend using home test kits to check for hidden blood in the stool.  A small camera at the end of a tube can be used to examine your  colon directly (sigmoidoscopy or colonoscopy). This is done to check for the earliest forms of colorectal cancer.  Routine screening usually begins at age 15.  Direct examination of the colon should be repeated every 5-10 years through 63 years of age. However, you may need to be screened more often if early forms of precancerous polyps or small growths are found. Skin Cancer  Check your skin from head to toe regularly.  Tell your health care provider about any new moles or changes in moles, especially if there is a change in a mole's shape or color.  Also tell your health care provider if you have a mole that is larger than the size of a pencil eraser.  Always use sunscreen. Apply sunscreen liberally and repeatedly throughout the day.  Protect yourself by wearing long sleeves, pants, a wide-brimmed hat, and sunglasses whenever you are outside. HEART DISEASE, DIABETES, AND HIGH BLOOD PRESSURE   High blood pressure causes heart disease and increases the risk of stroke. High blood pressure is more likely to develop in:  People who have blood pressure in the high end of the normal range (130-139/85-89 mm Hg).  People who are overweight or obese.  People who are African American.  If you are 81-25 years of age, have your blood pressure checked every 3-5 years. If you are 104 years of age or older, have your blood pressure checked every year. You should have your blood pressure measured twice--once when you are at a hospital or clinic, and once when you are not at a hospital or clinic. Record the average of the two measurements. To check your blood pressure when you are not at a hospital or clinic, you can use:  An automated blood pressure machine at a pharmacy.  A home blood pressure monitor.  If you are between 10 years and 63 years old, ask your health care provider if you should take aspirin to prevent strokes.  Have regular diabetes screenings. This involves taking a blood sample to  check your fasting blood sugar level.  If you are at a normal weight and have a low risk for diabetes, have this test once every three years after 63 years of age.  If you are overweight and have a high risk for diabetes, consider being tested at a younger age or more often. PREVENTING INFECTION  Hepatitis B  If you have a higher risk for hepatitis B, you should be screened for this virus. You are considered at high risk for hepatitis B if:  You were born in a country where hepatitis B is common. Ask your health care provider which countries are considered high risk.  Your parents were born in a high-risk country, and you have not been immunized against hepatitis B (hepatitis B vaccine).  You have HIV or  AIDS.  You use needles to inject street drugs.  You live with someone who has hepatitis B.  You have had sex with someone who has hepatitis B.  You get hemodialysis treatment.  You take certain medicines for conditions, including cancer, organ transplantation, and autoimmune conditions. Hepatitis C  Blood testing is recommended for:  Everyone born from [birth year] through 1963.  Anyone with known risk factors for hepatitis C. Sexually transmitted infections (STIs)  You should be screened for sexually transmitted infections (STIs) including gonorrhea and chlamydia if:  You are sexually active and are younger than 63 years of age.  You are older than 63 years of age and your health care provider tells you that you are at risk for this type of infection.  Your sexual activity has changed since you were last screened and you are at an increased risk for chlamydia or gonorrhea. Ask your health care provider if you are at risk.  If you do not have HIV, but are at risk, it may be recommended that you take a prescription medicine daily to prevent HIV infection. This is called pre-exposure prophylaxis (PrEP). You are considered at risk if:  You are sexually active and do not regularly use  condoms or know the HIV status of your partner(s).  You take drugs by injection.  You are sexually active with a partner who has HIV. Talk with your health care provider about whether you are at high risk of being infected with HIV. If you choose to begin PrEP, you should first be tested for HIV. You should then be tested every 3 months for as long as you are taking PrEP.  PREGNANCY   If you are premenopausal and you may become pregnant, ask your health care provider about preconception counseling.  If you may become pregnant, take 400 to 800 micrograms (mcg) of folic acid every day.  If you want to prevent pregnancy, talk to your health care provider about birth control (contraception). OSTEOPOROSIS AND MENOPAUSE   Osteoporosis is a disease in which the bones lose minerals and strength with aging. This can result in serious bone fractures. Your risk for osteoporosis can be identified using a bone density scan.  If you are 47 years of age or older, or if you are at risk for osteoporosis and fractures, ask your health care provider if you should be screened.  Ask your health care provider whether you should take a calcium or vitamin D supplement to lower your risk for osteoporosis.  Menopause may have certain physical symptoms and risks.  Hormone replacement therapy may reduce some of these symptoms and risks. Talk to your health care provider about whether hormone replacement therapy is right for you.  HOME CARE INSTRUCTIONS   Schedule regular health, dental, and eye exams.  Stay current with your immunizations.   Do not use any tobacco products including cigarettes, chewing tobacco, or electronic cigarettes.  If you are pregnant, do not drink alcohol.  If you are breastfeeding, limit how much and how often you drink alcohol.  Limit alcohol intake to no more than 1 drink per day for nonpregnant women. One drink equals 12 ounces of beer, 5 ounces of wine, or 1 ounces of hard  liquor.  Do not use street drugs.  Do not share needles.  Ask your health care provider for help if you need support or information about quitting drugs.  Tell your health care provider if you often feel depressed.  Tell your health care provider  if you have ever been abused or do not feel safe at home.   This information is not intended to replace advice given to you by your health care provider. Make sure you discuss any questions you have with your health care provider.   Document Released: 03/28/2011 Document Revised: 10/03/2014 Document Reviewed: 08/14/2013 Elsevier Interactive Patient Education 2016 [employer].  Exercising to [employer] Exercising regularly is important. It has many health benefits, such as:  Improving your overall fitness, flexibility, and endurance.  Increasing your bone density.  Helping with weight control.  Decreasing your body fat.  Increasing your muscle strength.  Reducing stress and tension.  Improving your overall health. In order to become healthy and stay healthy, it is recommended that you do moderate-intensity and vigorous-intensity exercise. You can tell that you are exercising at a moderate intensity if you have a higher heart rate and faster breathing, but you are still able to hold a conversation. You can tell that you are exercising at a vigorous intensity if you are breathing much harder and faster and cannot hold a conversation while exercising. HOW OFTEN SHOULD I EXERCISE? Choose an activity that you enjoy and set realistic goals. Your health care provider can help you to make an activity plan that works for you. Exercise regularly as directed by your health care provider. This may include:   Doing resistance training twice each week, such as:  Push-ups.  Sit-ups.  Lifting weights.  Using resistance bands.  Doing a given intensity of exercise for a given amount of time. Choose from these options:  150 minutes of  moderate-intensity exercise every week.  75 minutes of vigorous-intensity exercise every week.  A mix of moderate-intensity and vigorous-intensity exercise every week. Children, pregnant women, people who are out of shape, people who are overweight, and older adults may need to consult a health care provider for individual recommendations. If you have any sort of medical condition, be sure to consult your health care provider before starting a new exercise program.  WHAT ARE SOME EXERCISE IDEAS? Some moderate-intensity exercise ideas include:   Walking at a rate of 1 mile in 15 minutes.  Biking.  Hiking.  Golfing.  Dancing. Some vigorous-intensity exercise ideas include:   Walking at a rate of at least 4.5 miles per hour.  Jogging or running at a rate of 5 miles per hour.  Biking at a rate of at least 10 miles per hour.  Lap swimming.  Roller-skating or in-line skating.  Cross-country skiing.  Vigorous competitive sports, such as football, basketball, and soccer.  Jumping rope.  Aerobic dancing. WHAT ARE SOME EVERYDAY ACTIVITIES THAT CAN HELP ME TO GET EXERCISE?  Yard work, such as:  Psychologist, educational.  Raking and bagging leaves.  Washing and waxing your car.  Pushing a stroller.  Shoveling snow.  Gardening.  Washing windows or floors. HOW CAN I BE MORE ACTIVE IN MY DAY-TO-DAY ACTIVITIES?  Use the stairs instead of the elevator.  Take a walk during your lunch break.  If you drive, park your car farther away from work or school.  If you take public transportation, get off one stop early and walk the rest of the way.  Make all of your phone calls while standing up and walking around.  Get up, stretch, and walk around every 30 minutes throughout the day. WHAT GUIDELINES SHOULD I FOLLOW WHILE EXERCISING?  Do not exercise so much that you hurt yourself, feel dizzy, or get very short  of breath.  Consult your health care provider before starting a  new exercise program.  Wear comfortable clothes and shoes with good support.  Drink plenty of water while you exercise to prevent dehydration or heat stroke. Body water is lost during exercise and must be replaced.  Work out until you breathe faster and your heart beats faster.   This information is not intended to replace advice given to you by your health care provider. Make sure you discuss any questions you have with your health care provider.   Document Released: 10/15/2010 Document Revised: 10/03/2014 Document Reviewed: 02/13/2014 Elsevier Interactive Patient Education 2016 [place] [employer]. Catharine Kettlewell M.D.

## 2016-01-19 NOTE — Patient Instructions (Addendum)
Exercise ok  Yoga treadmill walking and weights   Can help heart  Health and  Blood pressure  And  Bone health .   ROV 6 months   Bmp cbcdiff and tox screen . Pre visit    Health Maintenance, Female Adopting a healthy lifestyle and getting preventive care can go a long way to promote health and wellness. Talk with your health care provider about what schedule of regular examinations is right for you. This is a good chance for you to check in with your provider about disease prevention and staying healthy. In between checkups, there are plenty of things you can do on your own. Experts have done a lot of research about which lifestyle changes and preventive measures are most likely to keep you healthy. Ask your health care provider for more information. WEIGHT AND DIET  Eat a healthy diet  Be sure to include plenty of vegetables, fruits, low-fat dairy products, and lean protein.  Do not eat a lot of foods high in solid fats, added sugars, or salt.  Get regular exercise. This is one of the most important things you can do for your health.  Most adults should exercise for at least 150 minutes each week. The exercise should increase your heart rate and make you sweat (moderate-intensity exercise).  Most adults should also do strengthening exercises at least twice a week. This is in addition to the moderate-intensity exercise.  Maintain a healthy weight  Body mass index (BMI) is a measurement that can be used to identify possible weight problems. It estimates body fat based on height and weight. Your health care provider can help determine your BMI and help you achieve or maintain a healthy weight.  For females 5 years of age and older:   A BMI below 18.5 is considered underweight.  A BMI of 18.5 to 24.9 is normal.  A BMI of 25 to 29.9 is considered overweight.  A BMI of 30 and above is considered obese.  Watch levels of cholesterol and blood lipids  You should start having your  blood tested for lipids and cholesterol at 63 years of age, then have this test every 5 years.  You may need to have your cholesterol levels checked more often if:  Your lipid or cholesterol levels are high.  You are older than 63 years of age.  You are at high risk for heart disease.  CANCER SCREENING   Lung Cancer  Lung cancer screening is recommended for adults 9-45 years old who are at high risk for lung cancer because of a history of smoking.  A yearly low-dose CT scan of the lungs is recommended for people who:  Currently smoke.  Have quit within the past 15 years.  Have at least a 30-pack-year history of smoking. A pack year is smoking an average of one pack of cigarettes a day for 1 year.  Yearly screening should continue until it has been 15 years since you quit.  Yearly screening should stop if you develop a health problem that would prevent you from having lung cancer treatment.  Breast Cancer  Practice breast self-awareness. This means understanding how your breasts normally appear and feel.  It also means doing regular breast self-exams. Let your health care provider know about any changes, no matter how small.  If you are in your 20s or 30s, you should have a clinical breast exam (CBE) by a health care provider every 1-3 years as part of a regular health exam.  If you are 40 or older, have a CBE every year. Also consider having a breast X-ray (mammogram) every year.  If you have a family history of breast cancer, talk to your health care provider about genetic screening.  If you are at high risk for breast cancer, talk to your health care provider about having an MRI and a mammogram every year.  Breast cancer gene (BRCA) assessment is recommended for women who have family members with BRCA-related cancers. BRCA-related cancers include:  Breast.  Ovarian.  Tubal.  Peritoneal cancers.  Results of the assessment will determine the need for genetic  counseling and BRCA1 and BRCA2 testing. Cervical Cancer Your health care provider may recommend that you be screened regularly for cancer of the pelvic organs (ovaries, uterus, and vagina). This screening involves a pelvic examination, including checking for microscopic changes to the surface of your cervix (Pap test). You may be encouraged to have this screening done every 3 years, beginning at age 60.  For women ages 62-65, health care providers may recommend pelvic exams and Pap testing every 3 years, or they may recommend the Pap and pelvic exam, combined with testing for human papilloma virus (HPV), every 5 years. Some types of HPV increase your risk of cervical cancer. Testing for HPV may also be done on women of any age with unclear Pap test results.  Other health care providers may not recommend any screening for nonpregnant women who are considered low risk for pelvic cancer and who do not have symptoms. Ask your health care provider if a screening pelvic exam is right for you.  If you have had past treatment for cervical cancer or a condition that could lead to cancer, you need Pap tests and screening for cancer for at least 20 years after your treatment. If Pap tests have been discontinued, your risk factors (such as having a new sexual partner) need to be reassessed to determine if screening should resume. Some women have medical problems that increase the chance of getting cervical cancer. In these cases, your health care provider may recommend more frequent screening and Pap tests. Colorectal Cancer  This type of cancer can be detected and often prevented.  Routine colorectal cancer screening usually begins at 63 years of age and continues through 63 years of age.  Your health care provider may recommend screening at an earlier age if you have risk factors for colon cancer.  Your health care provider may also recommend using home test kits to check for hidden blood in the stool.  A  small camera at the end of a tube can be used to examine your colon directly (sigmoidoscopy or colonoscopy). This is done to check for the earliest forms of colorectal cancer.  Routine screening usually begins at age 40.  Direct examination of the colon should be repeated every 5-10 years through 63 years of age. However, you may need to be screened more often if early forms of precancerous polyps or small growths are found. Skin Cancer  Check your skin from head to toe regularly.  Tell your health care provider about any new moles or changes in moles, especially if there is a change in a mole's shape or color.  Also tell your health care provider if you have a mole that is larger than the size of a pencil eraser.  Always use sunscreen. Apply sunscreen liberally and repeatedly throughout the day.  Protect yourself by wearing long sleeves, pants, a wide-brimmed hat, and sunglasses whenever you  are outside. HEART DISEASE, DIABETES, AND HIGH BLOOD PRESSURE   High blood pressure causes heart disease and increases the risk of stroke. High blood pressure is more likely to develop in:  People who have blood pressure in the high end of the normal range (130-139/85-89 mm Hg).  People who are overweight or obese.  People who are African American.  If you are 43-36 years of age, have your blood pressure checked every 3-5 years. If you are 76 years of age or older, have your blood pressure checked every year. You should have your blood pressure measured twice--once when you are at a hospital or clinic, and once when you are not at a hospital or clinic. Record the average of the two measurements. To check your blood pressure when you are not at a hospital or clinic, you can use:  An automated blood pressure machine at a pharmacy.  A home blood pressure monitor.  If you are between 44 years and 62 years old, ask your health care provider if you should take aspirin to prevent strokes.  Have  regular diabetes screenings. This involves taking a blood sample to check your fasting blood sugar level.  If you are at a normal weight and have a low risk for diabetes, have this test once every three years after 63 years of age.  If you are overweight and have a high risk for diabetes, consider being tested at a younger age or more often. PREVENTING INFECTION  Hepatitis B  If you have a higher risk for hepatitis B, you should be screened for this virus. You are considered at high risk for hepatitis B if:  You were born in a country where hepatitis B is common. Ask your health care provider which countries are considered high risk.  Your parents were born in a high-risk country, and you have not been immunized against hepatitis B (hepatitis B vaccine).  You have HIV or AIDS.  You use needles to inject street drugs.  You live with someone who has hepatitis B.  You have had sex with someone who has hepatitis B.  You get hemodialysis treatment.  You take certain medicines for conditions, including cancer, organ transplantation, and autoimmune conditions. Hepatitis C  Blood testing is recommended for:  Everyone born from [birth year] through 1963.  Anyone with known risk factors for hepatitis C. Sexually transmitted infections (STIs)  You should be screened for sexually transmitted infections (STIs) including gonorrhea and chlamydia if:  You are sexually active and are younger than 63 years of age.  You are older than 63 years of age and your health care provider tells you that you are at risk for this type of infection.  Your sexual activity has changed since you were last screened and you are at an increased risk for chlamydia or gonorrhea. Ask your health care provider if you are at risk.  If you do not have HIV, but are at risk, it may be recommended that you take a prescription medicine daily to prevent HIV infection. This is called pre-exposure prophylaxis (PrEP). You are  considered at risk if:  You are sexually active and do not regularly use condoms or know the HIV status of your partner(s).  You take drugs by injection.  You are sexually active with a partner who has HIV. Talk with your health care provider about whether you are at high risk of being infected with HIV. If you choose to begin PrEP, you should first be tested for  HIV. You should then be tested every 3 months for as long as you are taking PrEP.  PREGNANCY   If you are premenopausal and you may become pregnant, ask your health care provider about preconception counseling.  If you may become pregnant, take 400 to 800 micrograms (mcg) of folic acid every day.  If you want to prevent pregnancy, talk to your health care provider about birth control (contraception). OSTEOPOROSIS AND MENOPAUSE   Osteoporosis is a disease in which the bones lose minerals and strength with aging. This can result in serious bone fractures. Your risk for osteoporosis can be identified using a bone density scan.  If you are 54 years of age or older, or if you are at risk for osteoporosis and fractures, ask your health care provider if you should be screened.  Ask your health care provider whether you should take a calcium or vitamin D supplement to lower your risk for osteoporosis.  Menopause may have certain physical symptoms and risks.  Hormone replacement therapy may reduce some of these symptoms and risks. Talk to your health care provider about whether hormone replacement therapy is right for you.  HOME CARE INSTRUCTIONS   Schedule regular health, dental, and eye exams.  Stay current with your immunizations.   Do not use any tobacco products including cigarettes, chewing tobacco, or electronic cigarettes.  If you are pregnant, do not drink alcohol.  If you are breastfeeding, limit how much and how often you drink alcohol.  Limit alcohol intake to no more than 1 drink per day for nonpregnant women. One  drink equals 12 ounces of beer, 5 ounces of wine, or 1 ounces of hard liquor.  Do not use street drugs.  Do not share needles.  Ask your health care provider for help if you need support or information about quitting drugs.  Tell your health care provider if you often feel depressed.  Tell your health care provider if you have ever been abused or do not feel safe at home.   This information is not intended to replace advice given to you by your health care provider. Make sure you discuss any questions you have with your health care provider.   Document Released: 03/28/2011 Document Revised: 10/03/2014 Document Reviewed: 08/14/2013 Elsevier Interactive Patient Education 2016 [employer].  Exercising to [employer] Exercising regularly is important. It has many health benefits, such as:  Improving your overall fitness, flexibility, and endurance.  Increasing your bone density.  Helping with weight control.  Decreasing your body fat.  Increasing your muscle strength.  Reducing stress and tension.  Improving your overall health. In order to become healthy and stay healthy, it is recommended that you do moderate-intensity and vigorous-intensity exercise. You can tell that you are exercising at a moderate intensity if you have a higher heart rate and faster breathing, but you are still able to hold a conversation. You can tell that you are exercising at a vigorous intensity if you are breathing much harder and faster and cannot hold a conversation while exercising. HOW OFTEN SHOULD I EXERCISE? Choose an activity that you enjoy and set realistic goals. Your health care provider can help you to make an activity plan that works for you. Exercise regularly as directed by your health care provider. This may include:   Doing resistance training twice each week, such as:  Push-ups.  Sit-ups.  Lifting weights.  Using resistance bands.  Doing a given intensity of exercise for a  given amount of  time. Choose from these options:  150 minutes of moderate-intensity exercise every week.  75 minutes of vigorous-intensity exercise every week.  A mix of moderate-intensity and vigorous-intensity exercise every week. Children, pregnant women, people who are out of shape, people who are overweight, and older adults may need to consult a health care provider for individual recommendations. If you have any sort of medical condition, be sure to consult your health care provider before starting a new exercise program.  WHAT ARE SOME EXERCISE IDEAS? Some moderate-intensity exercise ideas include:   Walking at a rate of 1 mile in 15 minutes.  Biking.  Hiking.  Golfing.  Dancing. Some vigorous-intensity exercise ideas include:   Walking at a rate of at least 4.5 miles per hour.  Jogging or running at a rate of 5 miles per hour.  Biking at a rate of at least 10 miles per hour.  Lap swimming.  Roller-skating or in-line skating.  Cross-country skiing.  Vigorous competitive sports, such as football, basketball, and soccer.  Jumping rope.  Aerobic dancing. WHAT ARE SOME EVERYDAY ACTIVITIES THAT CAN HELP ME TO GET EXERCISE?  Yard work, such as:  Psychologist, educational.  Raking and bagging leaves.  Washing and waxing your car.  Pushing a stroller.  Shoveling snow.  Gardening.  Washing windows or floors. HOW CAN I BE MORE ACTIVE IN MY DAY-TO-DAY ACTIVITIES?  Use the stairs instead of the elevator.  Take a walk during your lunch break.  If you drive, park your car farther away from work or school.  If you take public transportation, get off one stop early and walk the rest of the way.  Make all of your phone calls while standing up and walking around.  Get up, stretch, and walk around every 30 minutes throughout the day. WHAT GUIDELINES SHOULD I FOLLOW WHILE EXERCISING?  Do not exercise so much that you hurt yourself, feel dizzy, or get very short  of breath.  Consult your health care provider before starting a new exercise program.  Wear comfortable clothes and shoes with good support.  Drink plenty of water while you exercise to prevent dehydration or heat stroke. Body water is lost during exercise and must be replaced.  Work out until you breathe faster and your heart beats faster.   This information is not intended to replace advice given to you by your health care provider. Make sure you discuss any questions you have with your health care provider.   Document Released: 10/15/2010 Document Revised: 10/03/2014 Document Reviewed: 02/13/2014 Elsevier Interactive Patient Education [employer].

## 2016-01-24 ENCOUNTER — Encounter: Payer: Self-pay | Admitting: Internal Medicine

## 2016-01-24 DIAGNOSIS — Z79899 Other long term (current) drug therapy: Secondary | ICD-10-CM | POA: Insufficient documentation

## 2016-02-03 ENCOUNTER — Telehealth: Payer: Self-pay | Admitting: Internal Medicine
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
## 2016-02-04 MED ORDER — BUTALBITAL-APAP-CAFFEINE 50-325-40 MG PO TABS: ORAL_TABLET | ORAL | Status: DC

## 2016-02-04 NOTE — Telephone Encounter (Signed)
Called to the pharmacy and left on machine. 

## 2016-02-04 NOTE — Telephone Encounter (Signed)
Can refill  Disp # 10

## 2016-02-04 NOTE — Telephone Encounter (Signed)
Left a message notifying the pt to pick up at the pharmacy.

## 2016-02-17 ENCOUNTER — Other Ambulatory Visit: Payer: Self-pay | Admitting: Internal Medicine

## 2016-02-17 NOTE — Telephone Encounter (Signed)
Pt request refill of the following:  busPIRone (BUSPAR) 15 MG tablet   [facility] [place]

## 2016-02-17 NOTE — Telephone Encounter (Signed)
Please refill x6. 

## 2016-02-17 NOTE — Telephone Encounter (Signed)
Last refill 09/03/16, #90 with 4 refills... Last OV 01/19/16... Okay to refill?

## 2016-02-18 MED ORDER — BUSPIRONE HCL 15 MG PO TABS: ORAL_TABLET | ORAL | Status: DC

## 2016-03-01 ENCOUNTER — Other Ambulatory Visit: Payer: Self-pay | Admitting: Internal Medicine

## 2016-03-01 NOTE — Telephone Encounter (Signed)
Pt request refill  butalbital-acetaminophen-caffeine (FIORICET, ESGIC) 50-325-40 MG tablet  10 tabs pt states it is due 6/11 and that Sunday. Pt would like to know if this can be called in Friday   Pt aware Dr Fabian SharpPanosh out, but hopes she can get filled.  [facility]

## 2016-03-02 MED ORDER — BUTALBITAL-APAP-CAFFEINE 50-325-40 MG PO TABS: ORAL_TABLET | ORAL | Status: DC

## 2016-03-02 NOTE — Telephone Encounter (Signed)
Call in #10 with no rf  

## 2016-03-02 NOTE — Telephone Encounter (Signed)
Called to the pharmacy and left on machine. 

## 2016-03-18 ENCOUNTER — Telehealth: Payer: Self-pay | Admitting: Internal Medicine

## 2016-03-18 MED ORDER — ALPRAZOLAM 1 MG PO TABS: ORAL_TABLET | ORAL | Status: DC

## 2016-03-18 NOTE — Telephone Encounter (Signed)
Ok to refill x 3 

## 2016-03-18 NOTE — Telephone Encounter (Signed)
Pt need new Rx for Alprazolam do not need Rx until 6/28   Ph[facility]  [facility]  [address] [place]   [place] [phone]

## 2016-03-18 NOTE — Telephone Encounter (Signed)
Called to the pharmacy and left on machine. 

## 2016-03-28 ENCOUNTER — Telehealth: Payer: Self-pay | Admitting: Internal Medicine

## 2016-03-28 MED ORDER — BUTALBITAL-APAP-CAFFEINE 50-325-40 MG PO TABS: ORAL_TABLET | ORAL | Status: DC

## 2016-03-28 NOTE — Telephone Encounter (Signed)
Ok to refill x 1  

## 2016-03-28 NOTE — Telephone Encounter (Signed)
Pt need new Rx for butalbital   Pharm:  [facility] [place] [address] [phone]

## 2016-03-28 NOTE — Telephone Encounter (Signed)
Called to the pharmacy and left on machine. 

## 2016-04-25 ENCOUNTER — Other Ambulatory Visit: Payer: Self-pay | Admitting: Internal Medicine

## 2016-04-25 NOTE — Telephone Encounter (Signed)
Ok to refill x  2  Disp 10 each   with  With  Added sig  One refill per month . Thanks

## 2016-04-25 NOTE — Telephone Encounter (Signed)
Pt request refill  butalbital-acetaminophen-caffeine (FIORICET, ESGIC) 50-325-40 MG tablet  Pt would like to know if you will put a refill on it, so that she will not have to call back every month  [facility] [place] [phone]

## 2016-04-26 NOTE — Telephone Encounter (Signed)
Tried to reach the pharmacy X 3.  No answer or machine.  Will try again at a later time.

## 2016-04-27 MED ORDER — BUTALBITAL-APAP-CAFFEINE 50-325-40 MG PO TABS: ORAL_TABLET | ORAL | 1 refills | Status: DC

## 2016-04-27 NOTE — Telephone Encounter (Signed)
Pt states maybe it will be easier to send to the other [facility]. Pt states please send to  [id number] [place] - [address]

## 2016-04-27 NOTE — Telephone Encounter (Signed)
Called to the pharmacy and left on machine.  Gave detailed instructions for new pt sig.

## 2016-04-28 ENCOUNTER — Telehealth: Payer: Self-pay | Admitting: Internal Medicine

## 2016-04-28 ENCOUNTER — Other Ambulatory Visit: Payer: Self-pay | Admitting: Internal Medicine

## 2016-04-28 NOTE — Telephone Encounter (Signed)
° °  Pt contacted pharmacy and they said something was wrong with there phone answering maching yesterday and ask if the following rx can be called in again    butalbital-acetaminophen-caffeine (FIORICET, ESGIC) 50-325-40 MG tablet

## 2016-04-28 NOTE — Telephone Encounter (Signed)
[facility]  Confirmed that prescription was not received.  Amy took prescription by telephone and stated she would get it ready for the pt.

## 2016-05-02 ENCOUNTER — Telehealth: Payer: Self-pay | Admitting: Internal Medicine

## 2016-05-02 NOTE — Telephone Encounter (Signed)
[facility] to [facility] [place]. They transferred the Fioricet prescription [facility] [place]  at the patient's request.  I also spoke to [facility] and informed them that the pt got the prescription filled on 04/28/16 #10 at the [facility] [place] and the prescription should not be filled again for 30 days.  Pharmacy understands and will not fill rx until appropriate time has lapsed.

## 2016-05-02 NOTE — Telephone Encounter (Signed)
°  [facility] and has question about a refill fro the following med    Pharmacy said pt just picked this med up on 04/28/16    butalbital-acetaminophen-caffeine (FIORICET, ESGIC) 50-325-40 MG

## 2016-06-08 ENCOUNTER — Telehealth: Payer: Self-pay | Admitting: Internal Medicine

## 2016-06-08 MED ORDER — ALPRAZOLAM 1 MG PO TABS: ORAL_TABLET | ORAL | 1 refills | Status: DC

## 2016-06-08 NOTE — Telephone Encounter (Signed)
° ° °  Pt request refill of the following:    ALPRAZolam (XANAX) 1 MG tablet  Pt has an appt in Oct and would like a refill     [facility]acy[facility]   [facility] [address]  [place]

## 2016-06-08 NOTE — Telephone Encounter (Signed)
Called to the pharmacy and left on machine. 

## 2016-06-08 NOTE — Telephone Encounter (Signed)
Ok to refill x 2  

## 2016-06-21 ENCOUNTER — Other Ambulatory Visit: Payer: Self-pay | Admitting: Internal Medicine

## 2016-06-21 NOTE — Telephone Encounter (Signed)
Pt needs new rx fioricet #10 w/[facility] [address] [place]. Pt is due on 06-26-16 and would like to have it refill on 06-24-16

## 2016-06-21 NOTE — Telephone Encounter (Signed)
Ok  ro refill x1 on date  She requested

## 2016-06-23 NOTE — Telephone Encounter (Signed)
Please send in to pharmacy. Thank you.  [facility] [address] [facility] [place]

## 2016-06-24 ENCOUNTER — Other Ambulatory Visit: Payer: Self-pay | Admitting: Internal Medicine

## 2016-06-24 MED ORDER — BUTALBITAL-APAP-CAFFEINE 50-325-40 MG PO TABS: ORAL_TABLET | ORAL | 1 refills | Status: DC

## 2016-06-24 NOTE — Telephone Encounter (Signed)
Rx printed, waiting for MD signature  

## 2016-06-24 NOTE — Addendum Note (Signed)
Addended by: Marchia MeiersEASTWOOD, Apollonia Amini M on: 06/24/2016 12:21 PM   Modules accepted: Orders

## 2016-06-27 ENCOUNTER — Other Ambulatory Visit: Payer: Self-pay | Admitting: Internal Medicine

## 2016-06-28 NOTE — Telephone Encounter (Signed)
Spoke to the pharmacy.  Pt has prescription filled on 06/24/16.

## 2016-07-12 ENCOUNTER — Other Ambulatory Visit (INDEPENDENT_AMBULATORY_CARE_PROVIDER_SITE_OTHER): Payer: BLUE CROSS/BLUE SHIELD

## 2016-07-12 ENCOUNTER — Encounter: Payer: Self-pay | Admitting: Internal Medicine

## 2016-07-12 DIAGNOSIS — D649 Anemia, unspecified: Secondary | ICD-10-CM

## 2016-07-12 DIAGNOSIS — I10 Essential (primary) hypertension: Secondary | ICD-10-CM | POA: Diagnosis not present

## 2016-07-12 LAB — CBC WITH DIFFERENTIAL/PLATELET
BASOS ABS: 0 10*3/uL (ref 0.0–0.1)
Basophils Relative: 0.5 % (ref 0.0–3.0)
EOS ABS: 0.1 10*3/uL (ref 0.0–0.7)
Eosinophils Relative: 2.1 % (ref 0.0–5.0)
HEMATOCRIT: 38.8 % (ref 36.0–46.0)
Hemoglobin: 13.3 g/dL (ref 12.0–15.0)
LYMPHS ABS: 1.5 10*3/uL (ref 0.7–4.0)
LYMPHS PCT: 25.9 % (ref 12.0–46.0)
MCHC: 34.3 g/dL (ref 30.0–36.0)
MCV: 92.9 fl (ref 78.0–100.0)
MONO ABS: 0.4 10*3/uL (ref 0.1–1.0)
Monocytes Relative: 6.7 % (ref 3.0–12.0)
NEUTROS ABS: 3.9 10*3/uL (ref 1.4–7.7)
NEUTROS PCT: 64.8 % (ref 43.0–77.0)
PLATELETS: 282 10*3/uL (ref 150.0–400.0)
RBC: 4.17 Mil/uL (ref 3.87–5.11)
RDW: 12.8 % (ref 11.5–15.5)
WBC: 6 10*3/uL (ref 4.0–10.5)

## 2016-07-12 LAB — BASIC METABOLIC PANEL
BUN: 20 mg/dL (ref 6–23)
CHLORIDE: 101 meq/L (ref 96–112)
CO2: 31 mEq/L (ref 19–32)
Calcium: 9.5 mg/dL (ref 8.4–10.5)
Creatinine, Ser: 1 mg/dL (ref 0.40–1.20)
GFR: 59.46 mL/min — AB (ref >60.00)
Glucose, Bld: 81 mg/dL (ref 70–99)
POTASSIUM: 4.3 meq/L (ref 3.5–5.1)
SODIUM: 140 meq/L (ref 135–145)

## 2016-07-19 ENCOUNTER — Ambulatory Visit (INDEPENDENT_AMBULATORY_CARE_PROVIDER_SITE_OTHER): Payer: BLUE CROSS/BLUE SHIELD | Admitting: Internal Medicine

## 2016-07-19 ENCOUNTER — Telehealth: Payer: Self-pay | Admitting: Internal Medicine

## 2016-07-19 ENCOUNTER — Encounter: Payer: Self-pay | Admitting: Internal Medicine

## 2016-07-19 VITALS — BP 112/64 | Temp 98.1°F | Wt 175.1 lb

## 2016-07-19 DIAGNOSIS — R7989 Other specified abnormal findings of blood chemistry: Secondary | ICD-10-CM | POA: Diagnosis not present

## 2016-07-19 DIAGNOSIS — I10 Essential (primary) hypertension: Secondary | ICD-10-CM | POA: Diagnosis not present

## 2016-07-19 DIAGNOSIS — F411 Generalized anxiety disorder: Secondary | ICD-10-CM | POA: Diagnosis not present

## 2016-07-19 DIAGNOSIS — Z79899 Other long term (current) drug therapy: Secondary | ICD-10-CM

## 2016-07-19 DIAGNOSIS — M25561 Pain in right knee: Secondary | ICD-10-CM

## 2016-07-19 MED ORDER — BUSPIRONE HCL 15 MG PO TABS: ORAL_TABLET | ORAL | 5 refills | Status: DC

## 2016-07-19 MED ORDER — BUTALBITAL-APAP-CAFFEINE 50-325-40 MG PO TABS: ORAL_TABLET | ORAL | 0 refills | Status: DC

## 2016-07-19 MED ORDER — ALPRAZOLAM 1 MG PO TABS: ORAL_TABLET | ORAL | 1 refills | Status: DC

## 2016-07-19 NOTE — Progress Notes (Signed)
Chief Complaint  Patient presents with  . Follow-up    HPI: Brooke Pennington 65 y.o. comes in for follow-up of multiple problems and med evaluation.  Meds buspar : And Xanax 4 times a day seems to help her anxiety remain on this. Taking half of a pressure medicine but skipping and a lot of times. No side effects noted. Still want to butalbital 10 months discussion about [facility]ies [facility]. I can we refill the one bottle 4 days early  r knee  issues  bothers off and on . Did a lot of walking question after foods some pain behind the back of her knee and anterior not every day but made it hard for her to walk and that is her main exercise. No specific injury or fall.  No bleeding. ROS: See pertinent positives and negatives per HPI.  Past Medical History:  Diagnosis Date  . Anal fissure    hx of same  . Dependence, barbiturates ? 05/01/2013   Have come to the conclusion that she is dependent on this medication based on her history and the fact that she feels better when she takes it   . Eczema   . H/O rectal polypectomy    ?  uncertain  if colonic or rectal colonsocopy 8 2007 [facility]  . Headache(784.0)   . Hypertension   . Lymphocytic gastritis    neg SBBX for celiac  in 8 2007   . Medication management failed tox screen 11/27/2013   Neg for xanax and butalbital on urine screen . 2 /15   . Menopause    age 89 with sx no hrt  . PMR (polymyalgia rheumatica) (HCC)    Possible diagnoses by primary care physician with her anemia and elevated sedimentation rate of 60 was on prednisone but stopped it herself and then felt fine    Family History  Problem Relation Age of Onset  . Coronary artery disease Father   . Heart disease Father   . COPD Mother   . Anemia Sister     needing transfusion some> vit b12 ?    Social History   Social History  . Marital status: Married    Spouse name:  N/A  . Number of children: N/A  . Years of education: N/A   Social History Main Topics  . Smoking status: Never Smoker  . Smokeless tobacco: Never Used     Comment: never used tobacco  . Alcohol use 0.6 oz/week    1 Glasses of wine per week  . Drug use: No  . Sexual activity: Yes   Other Topics Concern  . 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  Has smoke detector and wears seat belts.  No firearms. No excess sun exposure. Sees dentist regularly . No depression      Only 1 car at this time   No etoh   Father passed dec 23 15 chf heart related age 42          Outpatient Medications Prior to Visit  Medication Sig Dispense Refill  . ammonium lactate (LAC-HYDRIN) 12 % lotion Apply 1 application topically as needed.   0  . latanoprost (XALATAN) 0.005 % ophthalmic solution Place 1  drop into both eyes at bedtime.   3  . lisinopril-hydrochlorothiazide (PRINZIDE,ZESTORETIC) 20-12.5 MG tablet Take 0.5 tablets by mouth daily. Or as directed For hypertension 90 tablet 0  . MULTIPLE VITAMIN PO Take 1 tablet by mouth daily.    Marland Kitchen. tretinoin (RETIN-A) 0.1 % cream Apply 1 application topically at bedtime.   3  . ALPRAZolam (XANAX) 1 MG tablet One tablet by mouth 4 times a day as needed. 120 tablet 1  . busPIRone (BUSPAR) 15 MG tablet TAKE 1 TABLET (15 MG TOTAL) BY MOUTH 3 (THREE) TIMES DAILY. 90 tablet 5  . butalbital-acetaminophen-caffeine (FIORICET, ESGIC) 50-325-40 MG tablet Take 1-2 tabs  as needed.  Limit Regular Use. One fill per month. 10 tablet 1   No facility-administered medications prior to visit.      EXAM:  BP 112/64 (BP Location: Left Arm, Patient Position: Sitting, Cuff Size: Normal)   Temp 98.1 F (36.7 C) (Oral)   Wt 175 lb 1.6 oz (79.4 kg)   BMI 31.52 kg/m   Body mass index is 31.52 kg/m.  GENERAL: vitals reviewed and listed above, alert, oriented,  appears well hydrated and in no acute distress HEENT: atraumatic, conjunctiva  clear, no obvious abnormalities on inspection of external nose and ears  NECK: no obvious masses on inspection palpation  LUNGS: clear to auscultation bilaterally, no wheezes, rales or rhonchi, good air movement CV: HRRR, no clubbing cyanosis or  peripheral edema nl cap refill  MS: moves all extremities without noticeable focal  abnormalityGait appears within normal limits area of concern is posterior knee and anterior knee apparatus but normal gait today and no crepitus. PSYCH: pleasant and cooperative, no obvious depression mild baseline normalr anxiety Lab Results  Component Value Date   WBC 6.0 07/12/2016   HGB 13.3 07/12/2016   HCT 38.8 07/12/2016   PLT 282.0 07/12/2016   GLUCOSE 81 07/12/2016   CHOL 247 (H) 12/30/2015   TRIG 114.0 12/30/2015   HDL 58.70 12/30/2015   LDLDIRECT 168.2 12/25/2012   LDLCALC 165 (H) 12/30/2015   ALT 19 12/30/2015   AST 21 12/30/2015   NA 140 07/12/2016   K 4.3 07/12/2016   CL 101 07/12/2016   CREATININE 1.00 07/12/2016   BUN 20 07/12/2016   CO2 31 07/12/2016   TSH 2.92 12/30/2015   Tox screen as expected positive for alprazolam ASSESSMENT AND PLAN:  Discussed the following assessment and plan:  HYPERTENSION, BENIGN - Controlled continue better to take it most every day skipping.  Medication management - Continue same medicine at this time benefit more than risk. Still limiting butalbital to 10 a month but she can get this one filled early. [facility] [place]  Anxiety state  Elevated serum creatinine - Improved since last visit.  Right knee pain, unspecified chronicity - Expectant management differential diagnosis your onset no specific injury see sports medicine [place] cross train Total visit 25mins > 50% spent counseling and coordinating care as indicated in above note and in instructions to patient .  -Patient advised to return or notify health care team   if symptoms worsen ,persist or new concerns arise.  Patient Instructions  Consider seeing sports medicine  About the right knee pain .  Blood pressure is good .  Kidney function is better .  [facility]  In future controlled substances.   CPX and labs in  6 months     Neta MendsWanda K. Keiko Myricks M.D.

## 2016-07-19 NOTE — Progress Notes (Signed)
Pre visit review using our clinic review tool, if applicable. No additional management support is needed unless otherwise documented below in the visit note. 

## 2016-07-19 NOTE — Patient Instructions (Signed)
Consider seeing sports medicine  About the right knee pain .  Blood pressure is good .  Kidney function is better .  [facility]  In future controlled substances.   CPX and labs in  6 months

## 2016-07-19 NOTE — Telephone Encounter (Signed)
Pt would like to have clarification on one of her medication.

## 2016-07-20 NOTE — Telephone Encounter (Signed)
Left a message for a return call.

## 2016-07-21 NOTE — Telephone Encounter (Signed)
Left a message for a return call.

## 2016-07-22 NOTE — Telephone Encounter (Signed)
Spoke to the pt.  She wanted to inform me that all [facility] will [facility] [place].  Advised pt to have pharmacy send electronic request.

## 2016-07-28 ENCOUNTER — Other Ambulatory Visit: Payer: Self-pay | Admitting: Internal Medicine

## 2016-07-29 NOTE — Telephone Encounter (Signed)
Spoke to the pharmacy.  Pt has refills on file.  Was filled on 07/28/16.  Will deny this request.  Pt has multiple refills.

## 2016-08-11 ENCOUNTER — Telehealth: Payer: Self-pay | Admitting: Internal Medicine

## 2016-08-11 NOTE — Telephone Encounter (Signed)
Pt need new Rx for butalbital    [facility]  [phone] [facility]

## 2016-08-12 MED ORDER — BUTALBITAL-APAP-CAFFEINE 50-325-40 MG PO TABS: ORAL_TABLET | ORAL | 0 refills | Status: DC

## 2016-08-12 NOTE — Telephone Encounter (Signed)
Refill x1 

## 2016-08-12 NOTE — Telephone Encounter (Signed)
Called to the pharmacy and left on machine. 

## 2016-09-05 ENCOUNTER — Telehealth: Payer: Self-pay | Admitting: Internal Medicine

## 2016-09-05 NOTE — Telephone Encounter (Signed)
Pt needs refill on butalbital 50-325-40 #[facility] [place] phone #[phone]

## 2016-09-05 NOTE — Telephone Encounter (Signed)
Call in #10 with no rf  

## 2016-09-05 NOTE — Telephone Encounter (Signed)
Pt is allowed #10 per month.  Please advise. Last filled 08/12/16.

## 2016-09-06 MED ORDER — BUTALBITAL-APAP-CAFFEINE 50-325-40 MG PO TABS: ORAL_TABLET | ORAL | 0 refills | Status: DC

## 2016-09-06 NOTE — Telephone Encounter (Signed)
Called to the pharmacy and left on machine. 

## 2016-09-21 ENCOUNTER — Telehealth: Payer: Self-pay | Admitting: Internal Medicine

## 2016-09-21 NOTE — Telephone Encounter (Signed)
Pt would like to know if Dr Fabian Sharppanosh will print out a rx for her ALPRAZolam Prudy Feeler(XANAX) 1 MG tablet  W/ reifills  Pt has appt on 01/13/2017 and has no more refills on her rx.  [place] and would like to pick up while she is here. So she can take the rx to the pharmacy when she needs.

## 2016-09-22 MED ORDER — ALPRAZOLAM 1 MG PO TABS: ORAL_TABLET | ORAL | 2 refills | Status: DC

## 2016-09-22 NOTE — Telephone Encounter (Signed)
Left a message for the pt to pick up at the front desk. 

## 2016-09-22 NOTE — Telephone Encounter (Signed)
Printed  1 refill x 2

## 2016-10-03 ENCOUNTER — Telehealth: Payer: Self-pay | Admitting: Internal Medicine

## 2016-10-03 MED ORDER — BUTALBITAL-APAP-CAFFEINE 50-325-40 MG PO TABS: ORAL_TABLET | ORAL | 0 refills | Status: DC

## 2016-10-03 NOTE — Telephone Encounter (Signed)
Called to the pharmacy and left on machine. 

## 2016-10-03 NOTE — Telephone Encounter (Signed)
Ok to refill x 1  

## 2016-10-03 NOTE — Telephone Encounter (Signed)
Pt need new Rx for butalbital-acetaminophen-caffeine  [facility]  [facility] [address]  [phone] pls remove the [facility] at [address]

## 2016-10-27 ENCOUNTER — Telehealth: Payer: Self-pay | Admitting: Internal Medicine

## 2016-10-27 NOTE — Telephone Encounter (Signed)
° ° ° °  Pt request refill of the following:  butalbital-acetaminophen-caffeine Marikay Alar[facility] [phone]    [facility] [address]

## 2016-10-28 NOTE — Telephone Encounter (Signed)
Ok to refill x 1  

## 2016-10-31 MED ORDER — BUTALBITAL-APAP-CAFFEINE 50-325-40 MG PO TABS: ORAL_TABLET | ORAL | 0 refills | Status: DC

## 2016-10-31 NOTE — Telephone Encounter (Signed)
Called to the pharmacy and left on machine. 

## 2016-11-23 ENCOUNTER — Telehealth: Payer: Self-pay | Admitting: Internal Medicine

## 2016-11-23 NOTE — Telephone Encounter (Signed)
Ok to refill x 1  

## 2016-11-23 NOTE — Telephone Encounter (Signed)
° ° °  Pt request refill of the following:   butalbital-acetaminophen-caffeine (FIORICET, ESGIC) 50-325-40 MG tablet  [facility]  [facility]

## 2016-11-23 NOTE — Telephone Encounter (Signed)
Pt wants refill

## 2016-11-24 MED ORDER — BUTALBITAL-APAP-CAFFEINE 50-325-40 MG PO TABS: ORAL_TABLET | ORAL | 0 refills | Status: DC

## 2016-11-24 NOTE — Telephone Encounter (Signed)
[facility]

## 2016-12-13 ENCOUNTER — Telehealth: Payer: Self-pay | Admitting: Internal Medicine

## 2016-12-13 DIAGNOSIS — F411 Generalized anxiety disorder: Secondary | ICD-10-CM

## 2016-12-13 DIAGNOSIS — Z79899 Other long term (current) drug therapy: Secondary | ICD-10-CM

## 2016-12-13 NOTE — Telephone Encounter (Addendum)
Pt request refill  ALPRAZolam (XANAX) 1 MG tablet With a refill and lisinopril-hydrochlorothiazide (PRINZIDE,ZESTORETIC) 20-12.5 MG tablet  Pt would like to pick up a written rx for these and pick up Friday march 23

## 2016-12-14 MED ORDER — LISINOPRIL-HYDROCHLOROTHIAZIDE 20-12.5 MG PO TABS: 0.5000 | ORAL_TABLET | Freq: Every day | ORAL | 0 refills | Status: DC

## 2016-12-14 MED ORDER — ALPRAZOLAM 1 MG PO TABS: ORAL_TABLET | ORAL | 2 refills | Status: DC

## 2016-12-14 NOTE — Addendum Note (Signed)
Addended by: Dominic PeaHOLSEY, Saige Busby V. on: 12/14/2016 03:02 PM   Modules accepted: Orders

## 2016-12-14 NOTE — Telephone Encounter (Signed)
Pt would like a refill for Xanax 1 mg with a refill. I already refilled the lisinopril until her next office visit for next month.

## 2016-12-14 NOTE — Telephone Encounter (Signed)
Ok to refill alprazalam x 2

## 2016-12-14 NOTE — Telephone Encounter (Signed)
Spoke with pt to inform her that the script for Xanax will be ready for pickup Friday.

## 2016-12-14 NOTE — Addendum Note (Signed)
Addended by: Dominic PeaHOLSEY, Elion Hocker V. on: 12/14/2016 01:37 PM   Modules accepted: Orders

## 2016-12-14 NOTE — Telephone Encounter (Signed)
Called pharmacy and cancelled the prescription that was phoned in. Printed the script for Xanax so pt can pick the script up at the office .

## 2016-12-14 NOTE — Telephone Encounter (Signed)
Pt would like to pick the script up for alprazolam on Friday 12/16/16 in the office and cancel the other one at the pharmacy.

## 2016-12-16 ENCOUNTER — Telehealth: Payer: Self-pay | Admitting: Internal Medicine

## 2016-12-16 ENCOUNTER — Other Ambulatory Visit: Payer: Self-pay | Admitting: Emergency Medicine

## 2016-12-16 MED ORDER — ALPRAZOLAM 1 MG PO TABS: ORAL_TABLET | ORAL | 2 refills | Status: DC

## 2016-12-16 NOTE — Telephone Encounter (Signed)
Prescription called into the pharmacy.

## 2016-12-16 NOTE — Telephone Encounter (Signed)
° °  Pt said she need this called in today and would like a call back when the RX is called in    Pt request refill of the following:  ALPRAZolam (XANAX) 1 MG tablet   [facility]  [facility] [address]

## 2016-12-20 ENCOUNTER — Telehealth: Payer: Self-pay | Admitting: Internal Medicine

## 2016-12-20 NOTE — Telephone Encounter (Signed)
Pt would like to know if you would refill her script early since you usually prescribed it for her every first of the month and a holiday is coming up

## 2016-12-20 NOTE — Telephone Encounter (Signed)
Pt request refill  ° °butalbital-acetaminophen-caffeine (FIORICET, ESGIC) 50-325-40 MG tablet ° °   °[id number] [place] [place] [facility] [address]   ° ° °

## 2016-12-20 NOTE — Telephone Encounter (Signed)
Ok to refill  On Thursday early x 1march 29

## 2016-12-21 ENCOUNTER — Other Ambulatory Visit: Payer: Self-pay | Admitting: Emergency Medicine

## 2016-12-21 MED ORDER — BUTALBITAL-APAP-CAFFEINE 50-325-40 MG PO TABS: ORAL_TABLET | ORAL | 0 refills | Status: DC

## 2016-12-21 NOTE — Telephone Encounter (Signed)
Spoke with pt about medication. Nothing further needed

## 2016-12-21 NOTE — Telephone Encounter (Signed)
Left voicemail for pt give the office a call back in reference to refill being sent In Thursday is the earliest

## 2017-01-06 ENCOUNTER — Encounter: Payer: Self-pay | Admitting: Internal Medicine

## 2017-01-06 ENCOUNTER — Other Ambulatory Visit (INDEPENDENT_AMBULATORY_CARE_PROVIDER_SITE_OTHER): Payer: BLUE CROSS/BLUE SHIELD

## 2017-01-06 DIAGNOSIS — Z0000 Encounter for general adult medical examination without abnormal findings: Secondary | ICD-10-CM | POA: Diagnosis not present

## 2017-01-06 LAB — CBC WITH DIFFERENTIAL/PLATELET
BASOS ABS: 0 10*3/uL (ref 0.0–0.1)
Basophils Relative: 0.6 % (ref 0.0–3.0)
EOS ABS: 0.1 10*3/uL (ref 0.0–0.7)
Eosinophils Relative: 2.3 % (ref 0.0–5.0)
HCT: 34.8 % — ABNORMAL LOW (ref 36.0–46.0)
HEMOGLOBIN: 11.7 g/dL — AB (ref 12.0–15.0)
Lymphocytes Relative: 32.2 % (ref 12.0–46.0)
Lymphs Abs: 1.6 10*3/uL (ref 0.7–4.0)
MCHC: 33.6 g/dL (ref 30.0–36.0)
MCV: 96.5 fl (ref 78.0–100.0)
MONO ABS: 0.3 10*3/uL (ref 0.1–1.0)
Monocytes Relative: 5.7 % (ref 3.0–12.0)
Neutro Abs: 3 10*3/uL (ref 1.4–7.7)
Neutrophils Relative %: 59.2 % (ref 43.0–77.0)
Platelets: 266 10*3/uL (ref 150.0–400.0)
RBC: 3.6 Mil/uL — AB (ref 3.87–5.11)
RDW: 12.7 % (ref 11.5–15.5)
WBC: 5 10*3/uL (ref 4.0–10.5)

## 2017-01-06 LAB — HEPATIC FUNCTION PANEL
ALT: 9 U/L (ref 0–35)
AST: 14 U/L (ref 0–37)
Albumin: 4.2 g/dL (ref 3.5–5.2)
Alkaline Phosphatase: 65 U/L (ref 39–117)
BILIRUBIN DIRECT: 0.1 mg/dL (ref 0.0–0.3)
Total Bilirubin: 0.5 mg/dL (ref 0.2–1.2)
Total Protein: 6.6 g/dL (ref 6.0–8.3)

## 2017-01-06 LAB — BASIC METABOLIC PANEL
BUN: 28 mg/dL — AB (ref 6–23)
CO2: 31 mEq/L (ref 19–32)
CREATININE: 1.27 mg/dL — AB (ref 0.40–1.20)
Calcium: 9 mg/dL (ref 8.4–10.5)
Chloride: 99 mEq/L (ref 96–112)
GFR: 45.05 mL/min — AB (ref >60.00)
Glucose, Bld: 69 mg/dL — ABNORMAL LOW (ref 70–99)
POTASSIUM: 4.2 meq/L (ref 3.5–5.1)
Sodium: 135 mEq/L (ref 135–145)

## 2017-01-06 LAB — LIPID PANEL
Cholesterol: 255 mg/dL — ABNORMAL HIGH (ref 0–200)
HDL: 54.6 mg/dL (ref >39.00)
LDL CALC: 176 mg/dL — AB (ref 0–99)
NONHDL: 199.97
Total CHOL/HDL Ratio: 5
Triglycerides: 121 mg/dL (ref 0.0–149.0)
VLDL: 24.2 mg/dL (ref 0.0–40.0)

## 2017-01-06 LAB — TSH: TSH: 2.03 u[IU]/mL (ref 0.35–4.50)

## 2017-01-13 ENCOUNTER — Other Ambulatory Visit: Payer: BLUE CROSS/BLUE SHIELD

## 2017-01-16 NOTE — Progress Notes (Signed)
Chief Complaint  Patient presents with  . Annual Exam    HPI: Patient  Brooke Pennington  66 y.o. comes in today for Preventive Health Care visit  And med management BP taking 1/2 lis hctz per day misses some no se of med  meds  Taking alpraz qid as usual  No change   assk is can get 20 fioricet for 2 months at this time .   Not exercising as much . Due for colonoscopy and prefers not but   Did have polpys at some point and cant remember with c gi in [place] .   Health Maintenance  Topic Date Due  . PAP SMEAR  09/26/2012  . COLONOSCOPY  05/24/2016  . HIV Screening  02/16/2017 (Originally 03/16/1966)  . Hepatitis C Screening  01/20/2018 (Originally [birth year])  . INFLUENZA VACCINE  04/26/2017  . MAMMOGRAM  09/09/2017  . TETANUS/TDAP  01/18/2026   Health Maintenance Review LIFESTYLE:  Exercise:  Not exericseing as much  Tobacco/ETS: no Alcohol:  no Sugar beverages:  Cut way back  Sleep: 6- 7  Drug use: no HH of 2  No pets     Not outside home     ROS:  Hemorrhoid bleeds ocass?   Itches also  GEN/ HEENT: No fever, significant weight changes sweats headaches vision problems hearing changes, CV/ PULM; No chest pain shortness of breath cough, syncope,edema  change in exercise tolerance. GI /GU: No adominal pain, vomiting, change in bowel habits. No blood in the stool. No significant GU symptoms. SKIN/HEME: ,no acute skin rashes suspicious lesions or bleeding. No lymphadenopathy, nodules, masses.  NEURO/ PSYCH:  No neurologic signs such as weakness numbness. IMM/ Allergy: No unusual infections.  Allergy .   REST of 12 system review negative except as per HPI   Past Medical History:  Diagnosis Date  . Anal fissure    hx of same  . Dependence, barbiturates ? 05/01/2013   Have come to the conclusion that she is dependent on this medication based on her history and the fact that she feels better when she takes it   . Eczema   . H/O rectal polypectomy    ?  uncertain  if colonic or  rectal colonsocopy 8 2007 gi [place]  . Headache(784.0)   . Hypertension   . Lymphocytic gastritis    neg SBBX for celiac  in 8 2007   . Medication management failed tox screen 11/27/2013   Neg for xanax and butalbital on urine screen . 2 /15   . Menopause    age 54 with sx no hrt  . PMR (polymyalgia rheumatica) (HCC)    Possible diagnoses by primary care physician with her anemia and elevated sedimentation rate of 60 was on prednisone but stopped it herself and then felt fine    Past Surgical History:  Procedure Laterality Date  . NO PAST SURGERIES      Family History  Problem Relation Age of Onset  . Coronary artery disease Father   . Heart disease Father   . COPD Mother   . Anemia Sister     needing transfusion some> vit b12 ?    Social History   Social History  . Marital status: Married    Spouse name: N/A  . Number of children: N/A  . Years of education: N/A   Social History Main Topics  . Smoking status: Never Smoker  . Smokeless tobacco: Never Used     Comment: never used tobacco  .  Alcohol use 0.6 oz/week    1 Glasses of wine per week     Comment: ocassionally  . Drug use: No  . Sexual activity: Yes   Other Topics Concern  . 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  Has smoke detector and wears seat belts.  No firearms. No excess sun exposure. Sees dentist regularly . No depression      Only 1 car at this time   No etoh   Father passed dec 23 15 chf heart related age 6          Outpatient Medications Prior to Visit  Medication Sig Dispense Refill  . ALPRAZolam (XANAX) 1 MG tablet One tablet by mouth 4 times a day as needed. 120 tablet 2  . ammonium lactate (LAC-HYDRIN) 12 % lotion Apply 1 application topically as needed.   0  . busPIRone (BUSPAR) 15 MG tablet TAKE 1 TABLET (15 MG TOTAL) BY MOUTH 3 (THREE) TIMES DAILY. 90 tablet  5  . latanoprost (XALATAN) 0.005 % ophthalmic solution Place 1 drop into both eyes at bedtime.   3  . MULTIPLE VITAMIN PO Take 1 tablet by mouth daily.    Marland Kitchen tretinoin (RETIN-A) 0.1 % cream Apply 1 application topically at bedtime.   3  . butalbital-acetaminophen-caffeine (FIORICET, ESGIC) 50-325-40 MG tablet Take 1-2 tabs  as needed.  Limit Regular Use. One fill per month. 10 tablet 0  . lisinopril-hydrochlorothiazide (PRINZIDE,ZESTORETIC) 20-12.5 MG tablet Take 0.5 tablets by mouth daily. Or as directed For hypertension 15 tablet 0   No facility-administered medications prior to visit.      EXAM:  BP 102/64 (BP Location: Right Arm, Patient Position: Sitting, Cuff Size: Normal)   Pulse 85   Temp 98 F (36.7 C) (Oral)   Ht 5' 2.5" (1.588 m)   Wt 163 lb 1.6 oz (74 kg)   BMI 29.36 kg/m   Body mass index is 29.36 kg/m. Wt Readings from Last 3 Encounters:  01/20/17 163 lb 1.6 oz (74 kg)  07/19/16 175 lb 1.6 oz (79.4 kg)  01/19/16 165 lb 3.2 oz (74.9 kg)    Physical Exam: Vital signs reviewed [id number] is a well-developed well-nourished alert cooperative    who appearsr stated age in no acute distress.  HEENT: normocephalic atraumatic , Eyes: PERRL EOM's full, conjunctiva clear, Nares: paten,t no deformity discharge or tenderness., Ears: no deformity EAC's clear TMs with normal landmarks. Mouth: clear OP, no lesions, edema.  Moist mucous membranes. Dentition in adequate repair. NECK: supple without masses, thyromegaly or bruits. CHEST/PULM:  Clear to auscultation and percussion breath sounds equal no wheeze , rales or rhonchi. No chest wall deformities or tenderness. Breast: normal by inspection . No dimpling, discharge, masses, tenderness or discharge . hsa gyne also  CV: PMI is nondisplaced, S1 S2 no gallops, murmurs, rubs. Peripheral pulses are full without delay.No JVD .  ABDOMEN: Bowel sounds normal nontender  No guard or rebound, no hepato splenomegal no CVA tenderness.  No  hernia. Rectal area  Lightened  And hemorrh tag no blood  Extremtities:  No clubbing cyanosis or edema, no acute joint swelling or redness no focal atrophy NEURO:  Oriented x3, cranial nerves 3-12 appear to be intact, no obvious focal weakness,gait within normal limits no abnormal reflexes or asymmetrical SKIN: No acute rashes normal turgor,  color, no bruising or petechiae. PSYCH: Oriented, good eye contact, mild anxiety , cognition and judgment appear normal. LN: no cervical axillary inguinal adenopathy  Lab Results  Component Value Date   WBC 5.0 01/06/2017   HGB 11.7 (L) 01/06/2017   HCT 34.8 (L) 01/06/2017   PLT 266.0 01/06/2017   GLUCOSE 69 (L) 01/06/2017   CHOL 255 (H) 01/06/2017   TRIG 121.0 01/06/2017   HDL 54.60 01/06/2017   LDLDIRECT 168.2 12/25/2012   LDLCALC 176 (H) 01/06/2017   ALT 9 01/06/2017   AST 14 01/06/2017   NA 135 01/06/2017   K 4.2 01/06/2017   CL 99 01/06/2017   CREATININE 1.27 (H) 01/06/2017   BUN 28 (H) 01/06/2017   CO2 31 01/06/2017   TSH 2.03 01/06/2017    BP Readings from Last 3 Encounters:  01/20/17 102/64  07/19/16 112/64  01/19/16 122/70    Lab results reviewed with patient   ASSESSMENT AND PLAN:  Discussed the following assessment and plan:  Visit for preventive health examination  Medication management  Elevated serum creatinine  Anxiety state - Plan: lisinopril-hydrochlorothiazide (PRINZIDE,ZESTORETIC) 20-12.5 MG tablet  High risk medication use - Plan: lisinopril-hydrochlorothiazide (PRINZIDE,ZESTORETIC) 20-12.5 MG tablet  Hyperlipidemia, unspecified hyperlipidemia type  Mild anemia  Anxiety state - stable external factors - Plan: lisinopril-hydrochlorothiazide (PRINZIDE,ZESTORETIC) 20-12.5 MG tablet  High risk medication use - stable limit butabitaol to 10 per month xtra 5 this month still on alprazolam every day - Plan: lisinopril-hydrochlorothiazide (PRINZIDE,ZESTORETIC) 20-12.5 MG tablet  Long term prescription  benzodiazepine use At this time stay on antihypertensive medications her creatinine is waxing and waning from 0.9-1.2-3 and the ACE inhibitor could be part of this but she is on a very low dose. We'll continue. Continued controlled substance benzo dependent at this time benefit more than risk of weaning. She will look into where she had her colonoscopy or if she wants [place] to refer her as she can make her own appointment however I advised that she would be best to have a colonoscopy as opposed to the cologuard based on her history. Her mild anemia is back minimal will follow. Her elevated cholesterol is probably from change in exercise down to none although her cardiovascular risk 10 year is only 4.5% advise intensify lifestyle intervention to get her lipids down. At this time agreed to give 20 pills for 60 days and no more. She is aware that I don't want her taking these medications very often or at all if possible. She has adhered to our treatment plan in the past. Patient Care Team: Madelin Headings, MD as PCP - General (Internal Medicine) Pollard,Harold Josph Macho, MD as Consulting Physician (Oncology) Patient Instructions  Increase exercise to help with cholesterol and other attention to diet Mediterranean eating. Stay on same dose of blood pressure medicine a half a pill a day. We'll give you 20 of  esgic  it but must last to hold 2 months. I feel that you should take colonoscopy as we discussed.  We'll plan repeat lab tests to include your blood count kidney function and cholesterol in about 4 months and then come in for a visit.   You can use Tylox for hemorrhoids but have your GYN and GI doctor look at those 2.  Risk calculation based on age blood pressure and cholesterol readings show that at this time he would have more risk than benefit of a cholesterol statin medicine. Will follow.    Mediterranean Diet A Mediterranean diet refers to  food and lifestyle choices that are based  on the traditions of countries located on the [place]. This way of eating has been shown to help prevent certain conditions and improve outcomes for people who have chronic diseases, like kidney disease and heart disease. What are tips for following this plan? Lifestyle   Cook and eat meals together with your family, when possible.  Drink enough fluid to keep your urine clear or pale yellow.  Be physically active every day. This includes:  Aerobic exercise like running or swimming.  Leisure activities like gardening, walking, or housework.  Get 7-8 hours of sleep each night.  If recommended by your health care provider, drink red wine in moderation. This means 1 glass a day for nonpregnant women and 2 glasses a day for men. A glass of wine equals 5 oz (150 mL). Reading food labels   Check the serving size of packaged foods. For foods such as rice and pasta, the serving size refers to the amount of cooked product, not dry.  Check the total fat in packaged foods. Avoid foods that have saturated fat or trans fats.  Check the ingredients list for added sugars, such as corn syrup. Shopping   At the grocery store, buy most of your food from the areas near the walls of the store. This includes:  Fresh fruits and vegetables (produce).  Grains, beans, nuts, and seeds. Some of these may be available in unpackaged forms or large amounts (in bulk).  Fresh seafood.  Poultry and eggs.  Low-fat dairy products.  Buy whole ingredients instead of prepackaged foods.  Buy fresh fruits and vegetables in-season from local farmers markets.  Buy frozen fruits and vegetables in resealable bags.  If you do not have access to quality fresh seafood, buy precooked frozen shrimp or canned fish, such as tuna, salmon, or sardines.  Buy small amounts of raw or cooked vegetables, salads, or olives from the deli or salad bar at your store.  Stock your pantry so you always have certain foods on  hand, such as olive oil, canned tuna, canned tomatoes, rice, pasta, and beans. Cooking   Cook foods with extra-virgin olive oil instead of using butter or other vegetable oils.  Have meat as a side dish, and have vegetables or grains as your main dish. This means having meat in small portions or adding small amounts of meat to foods like pasta or stew.  Use beans or vegetables instead of meat in common dishes like chili or lasagna.  Experiment with different cooking methods. Try roasting or broiling vegetables instead of steaming or sauteing them.  Add frozen vegetables to soups, stews, pasta, or rice.  Add nuts or seeds for added healthy fat at each meal. You can add these to yogurt, salads, or vegetable dishes.  Marinate fish or vegetables using olive oil, lemon juice, garlic, and fresh herbs. Meal planning   Plan to eat 1 vegetarian meal one day each week. Try to work up to 2 vegetarian meals, if possible.  Eat seafood 2 or more times a week.  Have healthy snacks readily available, such as:  Vegetable sticks with hummus.  Greek yogurt.  Fruit and nut trail mix.  Eat balanced meals throughout the week. This includes:  Fruit: 2-3 servings a day  Vegetables: 4-5 servings a day  Low-fat dairy: 2 servings a day  Fish, poultry, or lean meat: 1 serving a day  Beans and legumes: 2 or more servings a week  Nuts and  seeds: 1-2 servings a day  Whole grains: 6-8 servings a day  Extra-virgin olive oil: 3-4 servings a day  Limit red meat and sweets to only a few servings a month What are my food choices?  Mediterranean diet  Recommended  Grains: Whole-grain pasta. Brown rice. Bulgar wheat. Polenta. Couscous. Whole-wheat bread. Orpah Cobb.  Vegetables: Artichokes. Beets. Broccoli. Cabbage. Carrots. Eggplant. Green beans. Chard. Kale. Spinach. Onions. Leeks. Peas. Squash. Tomatoes. Peppers. Radishes.  Fruits: Apples. Apricots. Avocado. Berries. Bananas. Cherries.  Dates. Figs. Grapes. Lemons. Melon. Oranges. Peaches. Plums. Pomegranate.  Meats and other protein foods: Beans. Almonds. Sunflower seeds. Pine nuts. Peanuts. Cod. Salmon. Scallops. Shrimp. Tuna. Tilapia. Clams. Oysters. Eggs.  Dairy: Low-fat milk. Cheese. Greek yogurt.  Beverages: Water. Red wine. Herbal tea.  Fats and oils: Extra virgin olive oil. Avocado oil. Grape seed oil.  Sweets and desserts: [place] yogurt with honey. Baked apples. Poached pears. Trail mix.  Seasoning and other foods: Basil. Cilantro. Coriander. Cumin. Mint. Parsley. Sage. Rosemary. Tarragon. Garlic. Oregano. Thyme. Pepper. Balsalmic vinegar. Tahini. Hummus. Tomato sauce. Olives. Mushrooms.  Limit these  Grains: Prepackaged pasta or rice dishes. Prepackaged cereal with added sugar.  Vegetables: Deep fried potatoes (french fries).  Fruits: Fruit canned in syrup.  Meats and other protein foods: Beef. Pork. Lamb. Poultry with skin. Hot dogs. Tomasa Blase.  Dairy: Ice cream. Sour cream. Whole milk.  Beverages: Juice. Sugar-sweetened soft drinks. Beer. Liquor and spirits.  Fats and oils: Butter. Canola oil. Vegetable oil. Beef fat (tallow). Lard.  Sweets and desserts: Cookies. Cakes. Pies. Candy.  Seasoning and other foods: Mayonnaise. Premade sauces and marinades.  The items listed may not be a complete list. Talk with your dietitian about what dietary choices are right for you. Summary  The Mediterranean diet includes both food and lifestyle choices.  Eat a variety of fresh fruits and vegetables, beans, nuts, seeds, and whole grains.  Limit the amount of red meat and sweets that you eat.  Talk with your health care provider about whether it is safe for you to drink red wine in moderation. This means 1 glass a day for nonpregnant women and 2 glasses a day for men. A glass of wine equals 5 oz (150 mL). This information is not intended to replace advice given to you by your health care provider. Make sure you  discuss any questions you have with your health care provider. Document Released: 05/05/2016 Document Revised: 06/07/2016 Document Reviewed: 05/05/2016 Elsevier Interactive Patient Education  2017 [employer].      [place]. Hanifa Antonetti M.D.

## 2017-01-20 ENCOUNTER — Ambulatory Visit (INDEPENDENT_AMBULATORY_CARE_PROVIDER_SITE_OTHER): Payer: BLUE CROSS/BLUE SHIELD | Admitting: Internal Medicine

## 2017-01-20 ENCOUNTER — Encounter: Payer: Self-pay | Admitting: Internal Medicine

## 2017-01-20 VITALS — BP 102/64 | HR 85 | Temp 98.0°F | Ht 62.5 in | Wt 163.1 lb

## 2017-01-20 DIAGNOSIS — D649 Anemia, unspecified: Secondary | ICD-10-CM

## 2017-01-20 DIAGNOSIS — R7989 Other specified abnormal findings of blood chemistry: Secondary | ICD-10-CM | POA: Diagnosis not present

## 2017-01-20 DIAGNOSIS — Z0000 Encounter for general adult medical examination without abnormal findings: Secondary | ICD-10-CM

## 2017-01-20 DIAGNOSIS — E785 Hyperlipidemia, unspecified: Secondary | ICD-10-CM | POA: Diagnosis not present

## 2017-01-20 DIAGNOSIS — F411 Generalized anxiety disorder: Secondary | ICD-10-CM | POA: Diagnosis not present

## 2017-01-20 DIAGNOSIS — Z79899 Other long term (current) drug therapy: Secondary | ICD-10-CM

## 2017-01-20 MED ORDER — LISINOPRIL-HYDROCHLOROTHIAZIDE 20-12.5 MG PO TABS: 0.5000 | ORAL_TABLET | Freq: Every day | ORAL | 3 refills | Status: DC

## 2017-01-20 MED ORDER — BUTALBITAL-APAP-CAFFEINE 50-325-40 MG PO TABS: ORAL_TABLET | ORAL | 0 refills | Status: DC

## 2017-01-20 NOTE — Patient Instructions (Addendum)
Increase exercise to help with cholesterol and other attention to diet Mediterranean eating. Stay on same dose of blood pressure medicine a half a pill a day. We'll give you 20 of  esgic  it but must last to hold 2 months. I feel that you should take colonoscopy as we discussed.  We'll plan repeat lab tests to include your blood count kidney function and cholesterol in about 4 months and then come in for a visit.   You can use Tylox for hemorrhoids but have your GYN and GI doctor look at those 2.  Risk calculation based on age blood pressure and cholesterol readings show that at this time he would have more risk than benefit of a cholesterol statin medicine. Will follow.    Mediterranean Diet A Mediterranean diet refers to food and lifestyle choices that are based on the traditions of countries located on the [place]. This way of eating has been shown to help prevent certain conditions and improve outcomes for people who have chronic diseases, like kidney disease and heart disease. What are tips for following this plan? Lifestyle   Cook and eat meals together with your family, when possible.  Drink enough fluid to keep your urine clear or pale yellow.  Be physically active every day. This includes:  Aerobic exercise like running or swimming.  Leisure activities like gardening, walking, or housework.  Get 7-8 hours of sleep each night.  If recommended by your health care provider, drink red wine in moderation. This means 1 glass a day for nonpregnant women and 2 glasses a day for men. A glass of wine equals 5 oz (150 mL). Reading food labels   Check the serving size of packaged foods. For foods such as rice and pasta, the serving size refers to the amount of cooked product, not dry.  Check the total fat in packaged foods. Avoid foods that have saturated fat or trans fats.  Check the ingredients list for added sugars, such as corn syrup. Shopping   At the grocery  store, buy most of your food from the areas near the walls of the store. This includes:  Fresh fruits and vegetables (produce).  Grains, beans, nuts, and seeds. Some of these may be available in unpackaged forms or large amounts (in bulk).  Fresh seafood.  Poultry and eggs.  Low-fat dairy products.  Buy whole ingredients instead of prepackaged foods.  Buy fresh fruits and vegetables in-season from local farmers markets.  Buy frozen fruits and vegetables in resealable bags.  If you do not have access to quality fresh seafood, buy precooked frozen shrimp or canned fish, such as tuna, salmon, or sardines.  Buy small amounts of raw or cooked vegetables, salads, or olives from the deli or salad bar at your store.  Stock your pantry so you always have certain foods on hand, such as olive oil, canned tuna, canned tomatoes, rice, pasta, and beans. Cooking   Cook foods with extra-virgin olive oil instead of using butter or other vegetable oils.  Have meat as a side dish, and have vegetables or grains as your main dish. This means having meat in small portions or adding small amounts of meat to foods like pasta or stew.  Use beans or vegetables instead of meat in common dishes like chili or lasagna.  Experiment with different cooking methods. Try roasting or broiling vegetables instead of steaming or sauteing them.  Add frozen vegetables to soups, stews, pasta, or rice.  Add nuts or seeds  for added healthy fat at each meal. You can add these to yogurt, salads, or vegetable dishes.  Marinate fish or vegetables using olive oil, lemon juice, garlic, and fresh herbs. Meal planning   Plan to eat 1 vegetarian meal one day each week. Try to work up to 2 vegetarian meals, if possible.  Eat seafood 2 or more times a week.  Have healthy snacks readily available, such as:  Vegetable sticks with hummus.  Greek yogurt.  Fruit and nut trail mix.  Eat balanced meals throughout the week.  This includes:  Fruit: 2-3 servings a day  Vegetables: 4-5 servings a day  Low-fat dairy: 2 servings a day  Fish, poultry, or lean meat: 1 serving a day  Beans and legumes: 2 or more servings a week  Nuts and seeds: 1-2 servings a day  Whole grains: 6-8 servings a day  Extra-virgin olive oil: 3-4 servings a day  Limit red meat and sweets to only a few servings a month What are my food choices?  Mediterranean diet  Recommended  Grains: Whole-grain pasta. Brown rice. Bulgar wheat. Polenta. Couscous. Whole-wheat bread. Orpah Cobb.  Vegetables: Artichokes. Beets. Broccoli. Cabbage. Carrots. Eggplant. Green beans. Chard. Kale. Spinach. Onions. Leeks. Peas. Squash. Tomatoes. Peppers. Radishes.  Fruits: Apples. Apricots. Avocado. Berries. Bananas. Cherries. Dates. Figs. Grapes. Lemons. Melon. Oranges. Peaches. Plums. Pomegranate.  Meats and other protein foods: Beans. Almonds. Sunflower seeds. Pine nuts. Peanuts. Cod. Salmon. Scallops. Shrimp. Tuna. Tilapia. Clams. Oysters. Eggs.  Dairy: Low-fat milk. Cheese. Greek yogurt.  Beverages: Water. Red wine. Herbal tea.  Fats and oils: Extra virgin olive oil. Avocado oil. Grape seed oil.  Sweets and desserts: [place] yogurt with honey. Baked apples. Poached pears. Trail mix.  Seasoning and other foods: Basil. Cilantro. Coriander. Cumin. Mint. Parsley. Sage. Rosemary. Tarragon. Garlic. Oregano. Thyme. Pepper. Balsalmic vinegar. Tahini. Hummus. Tomato sauce. Olives. Mushrooms.  Limit these  Grains: Prepackaged pasta or rice dishes. Prepackaged cereal with added sugar.  Vegetables: Deep fried potatoes (french fries).  Fruits: Fruit canned in syrup.  Meats and other protein foods: Beef. Pork. Lamb. Poultry with skin. Hot dogs. Tomasa Blase.  Dairy: Ice cream. Sour cream. Whole milk.  Beverages: Juice. Sugar-sweetened soft drinks. Beer. Liquor and spirits.  Fats and oils: Butter. Canola oil. Vegetable oil. Beef fat (tallow).  Lard.  Sweets and desserts: Cookies. Cakes. Pies. Candy.  Seasoning and other foods: Mayonnaise. Premade sauces and marinades.  The items listed may not be a complete list. Talk with your dietitian about what dietary choices are right for you. Summary  The Mediterranean diet includes both food and lifestyle choices.  Eat a variety of fresh fruits and vegetables, beans, nuts, seeds, and whole grains.  Limit the amount of red meat and sweets that you eat.  Talk with your health care provider about whether it is safe for you to drink red wine in moderation. This means 1 glass a day for nonpregnant women and 2 glasses a day for men. A glass of wine equals 5 oz (150 mL). This information is not intended to replace advice given to you by your health care provider. Make sure you discuss any questions you have with your health care provider. Document Released: 05/05/2016 Document Revised: 06/07/2016 Document Reviewed: 05/05/2016 Elsevier Interactive Patient Education  2017 [employer].

## 2017-02-23 ENCOUNTER — Telehealth: Payer: Self-pay | Admitting: Internal Medicine

## 2017-02-23 NOTE — Telephone Encounter (Signed)
Pt need new Rx for butalbital-acetaminophen  Pharm:  [facility]  [place] [phone]  Pt would like to have the labs (lipid, bmp cbc diff and iron panel) that was discussed on 01/20/17 and pt would like to have toxic screening done the same day as lab if Dr. Fabian SharpPanosh would like for her to.

## 2017-02-24 NOTE — Telephone Encounter (Signed)
Last refill 01/20/17. Please advise.

## 2017-02-27 NOTE — Telephone Encounter (Signed)
Ok to refill x 1  Ok to do the labs and tox screen same day   We'll plan repeat lab tests to include your blood count kidney function and cholesterol in about 4 months and then come in for a visit.     Dx HLD, anemia , HT  Renal insuffiicency  Lipid cbcdiff,  [facility]   Ferritin Bmp  Plus tox UDS screen

## 2017-02-28 ENCOUNTER — Other Ambulatory Visit: Payer: Self-pay | Admitting: Emergency Medicine

## 2017-02-28 DIAGNOSIS — I10 Essential (primary) hypertension: Secondary | ICD-10-CM

## 2017-02-28 DIAGNOSIS — D649 Anemia, unspecified: Secondary | ICD-10-CM

## 2017-02-28 DIAGNOSIS — E786 Lipoprotein deficiency: Secondary | ICD-10-CM

## 2017-02-28 DIAGNOSIS — F411 Generalized anxiety disorder: Secondary | ICD-10-CM

## 2017-02-28 DIAGNOSIS — N289 Disorder of kidney and ureter, unspecified: Secondary | ICD-10-CM

## 2017-02-28 MED ORDER — BUTALBITAL-APAP-CAFFEINE 50-325-40 MG PO TABS: ORAL_TABLET | ORAL | 0 refills | Status: DC

## 2017-02-28 NOTE — Telephone Encounter (Signed)
Refill has been sent in and also labs have been ordered. Can you please help patient schedule lab appointment. Thank you!

## 2017-02-28 NOTE — Telephone Encounter (Signed)
lmom for pt to call back to schedule lab appointment. °

## 2017-02-28 NOTE — Telephone Encounter (Signed)
Pt will call back to reschedule.

## 2017-03-07 ENCOUNTER — Telehealth: Payer: Self-pay | Admitting: Internal Medicine

## 2017-03-07 NOTE — Telephone Encounter (Signed)
Please advise 

## 2017-03-07 NOTE — Telephone Encounter (Signed)
°  Pt asked if Dr Fabian SharpPanosh would give her refills until she comes in for her appt in August   Pt request refill of the following:  ALPRAZolam (XANAX) 1 MG tablet   [facility]

## 2017-03-08 ENCOUNTER — Telehealth: Payer: Self-pay | Admitting: Emergency Medicine

## 2017-03-08 ENCOUNTER — Other Ambulatory Visit: Payer: Self-pay | Admitting: Emergency Medicine

## 2017-03-08 MED ORDER — ALPRAZOLAM 1 MG PO TABS: ORAL_TABLET | ORAL | 3 refills | Status: DC

## 2017-03-08 NOTE — Telephone Encounter (Signed)
Spoke with pt and explain what is going with the pharmacy with the Xanax medication. Pt understands that she will be picking up the 2 mg instead of the 1 mg and she will cut the pills in half. Pt understood and has no further questions.

## 2017-03-08 NOTE — Telephone Encounter (Signed)
Medication has been called into the pharmacy 

## 2017-03-08 NOTE — Telephone Encounter (Signed)
Spoke with pharmacist and they are currently out of the 1 mg tab for xanax. Per Dr. Fabian SharpPanosh she wants the pharmacy to fill the rx for the 2mg  for only a month until the 1 mg is restocked. Will call pt and rely the message.

## 2017-03-08 NOTE — Telephone Encounter (Signed)
Ok to  Refill x 3 to get through august appt

## 2017-03-22 ENCOUNTER — Telehealth: Payer: Self-pay | Admitting: Internal Medicine

## 2017-03-22 NOTE — Telephone Encounter (Signed)
Pt would like to see if Dr. Fabian SharpPanosh would receive her butalbital-acetaminophe-caffeine  Pharm: [facility] [address]    Pt apologize she has been taking the Rx one a day and would just like to see if Dr. Fabian SharpPanosh would make an exception this one time.

## 2017-03-22 NOTE — Telephone Encounter (Signed)
Please advise 

## 2017-03-23 NOTE — Telephone Encounter (Signed)
You are taking too much  Can refill  #10 pill      July 5th   And will rx monthly as we agreed in the past.

## 2017-03-24 ENCOUNTER — Other Ambulatory Visit: Payer: Self-pay | Admitting: Emergency Medicine

## 2017-03-24 MED ORDER — BUTALBITAL-APAP-CAFFEINE 50-325-40 MG PO TABS: ORAL_TABLET | ORAL | 0 refills | Status: DC

## 2017-03-24 NOTE — Telephone Encounter (Signed)
Spoke with patient and explained to her that Dr. Fabian Sharppanosh thinks she is taking too much of this medication but she will refill it for 10 tabs only and the pharmacy will release this medication per Dr. Fabian SharpPanosh on July 3rd. Patient understood and had nothing further qurestions

## 2017-03-27 ENCOUNTER — Other Ambulatory Visit: Payer: Self-pay | Admitting: Internal Medicine

## 2017-03-27 NOTE — Telephone Encounter (Signed)
Last refill was 07/19/2016. May I refill. Please advise

## 2017-03-28 ENCOUNTER — Telehealth: Payer: Self-pay | Admitting: Internal Medicine

## 2017-03-28 NOTE — Telephone Encounter (Signed)
error 

## 2017-04-03 ENCOUNTER — Other Ambulatory Visit: Payer: Self-pay | Admitting: Internal Medicine

## 2017-04-03 NOTE — Telephone Encounter (Signed)
Ok to go back to original 1 mg  Qid as needed   Disp 120 refill x 1

## 2017-04-24 ENCOUNTER — Other Ambulatory Visit: Payer: Self-pay | Admitting: Emergency Medicine

## 2017-04-24 ENCOUNTER — Telehealth: Payer: Self-pay | Admitting: Internal Medicine

## 2017-04-24 MED ORDER — BUTALBITAL-APAP-CAFFEINE 50-325-40 MG PO TABS: ORAL_TABLET | ORAL | 0 refills | Status: DC

## 2017-04-24 NOTE — Telephone Encounter (Signed)
Pt request refill   butalbital-acetaminophen-caffeine (FIORICET, ESGIC) 50-325-40 MG tablet     [id number] [place] [place] [facility] [address]

## 2017-04-24 NOTE — Telephone Encounter (Signed)
Refill sent in

## 2017-04-24 NOTE — Telephone Encounter (Signed)
Ok to refill x 1 disp 10

## 2017-04-24 NOTE — Telephone Encounter (Signed)
Last refill was 03/24/2017

## 2017-05-09 ENCOUNTER — Telehealth: Payer: Self-pay | Admitting: Internal Medicine

## 2017-05-09 NOTE — Telephone Encounter (Addendum)
Pt would like to have toxicology screening along with her fasting labs on 05/16/17. Please put order for toxicology in system

## 2017-05-11 ENCOUNTER — Other Ambulatory Visit: Payer: BLUE CROSS/BLUE SHIELD

## 2017-05-11 ENCOUNTER — Other Ambulatory Visit: Payer: Self-pay | Admitting: Emergency Medicine

## 2017-05-11 DIAGNOSIS — Z79899 Other long term (current) drug therapy: Secondary | ICD-10-CM

## 2017-05-11 NOTE — Telephone Encounter (Signed)
Orders have been placed.

## 2017-05-12 ENCOUNTER — Other Ambulatory Visit: Payer: Self-pay | Admitting: Emergency Medicine

## 2017-05-12 DIAGNOSIS — Z79899 Other long term (current) drug therapy: Secondary | ICD-10-CM

## 2017-05-15 NOTE — Telephone Encounter (Signed)
Pt is aware.  

## 2017-05-16 ENCOUNTER — Other Ambulatory Visit (INDEPENDENT_AMBULATORY_CARE_PROVIDER_SITE_OTHER): Payer: BLUE CROSS/BLUE SHIELD

## 2017-05-16 DIAGNOSIS — I10 Essential (primary) hypertension: Secondary | ICD-10-CM

## 2017-05-16 DIAGNOSIS — Z79899 Other long term (current) drug therapy: Secondary | ICD-10-CM

## 2017-05-16 DIAGNOSIS — E786 Lipoprotein deficiency: Secondary | ICD-10-CM

## 2017-05-16 DIAGNOSIS — D649 Anemia, unspecified: Secondary | ICD-10-CM

## 2017-05-16 LAB — CBC WITH DIFFERENTIAL/PLATELET
BASOS ABS: 0 10*3/uL (ref 0.0–0.1)
Basophils Relative: 0.7 % (ref 0.0–3.0)
EOS ABS: 0.1 10*3/uL (ref 0.0–0.7)
Eosinophils Relative: 2 % (ref 0.0–5.0)
HEMATOCRIT: 36.8 % (ref 36.0–46.0)
HEMOGLOBIN: 12.4 g/dL (ref 12.0–15.0)
LYMPHS PCT: 34.4 % (ref 12.0–46.0)
Lymphs Abs: 1.7 10*3/uL (ref 0.7–4.0)
MCHC: 33.6 g/dL (ref 30.0–36.0)
MCV: 97.4 fl (ref 78.0–100.0)
Monocytes Absolute: 0.3 10*3/uL (ref 0.1–1.0)
Monocytes Relative: 6.8 % (ref 3.0–12.0)
Neutro Abs: 2.8 10*3/uL (ref 1.4–7.7)
Neutrophils Relative %: 56.1 % (ref 43.0–77.0)
Platelets: 249 10*3/uL (ref 150.0–400.0)
RBC: 3.78 Mil/uL — ABNORMAL LOW (ref 3.87–5.11)
RDW: 12.6 % (ref 11.5–15.5)
WBC: 5 10*3/uL (ref 4.0–10.5)

## 2017-05-16 LAB — BASIC METABOLIC PANEL
BUN: 22 mg/dL (ref 6–23)
CHLORIDE: 102 meq/L (ref 96–112)
CO2: 32 mEq/L (ref 19–32)
Calcium: 9.2 mg/dL (ref 8.4–10.5)
Creatinine, Ser: 1.28 mg/dL — ABNORMAL HIGH (ref 0.40–1.20)
GFR: 44.6 mL/min — ABNORMAL LOW (ref >60.00)
GLUCOSE: 79 mg/dL (ref 70–99)
POTASSIUM: 4.9 meq/L (ref 3.5–5.1)
Sodium: 136 mEq/L (ref 135–145)

## 2017-05-16 LAB — LIPID PANEL
CHOL/HDL RATIO: 4
Cholesterol: 204 mg/dL — ABNORMAL HIGH (ref 0–200)
HDL: 54.9 mg/dL (ref >39.00)
LDL CALC: 124 mg/dL — AB (ref 0–99)
NONHDL: 149.47
Triglycerides: 125 mg/dL (ref 0.0–149.0)
VLDL: 25 mg/dL (ref 0.0–40.0)

## 2017-05-16 LAB — IBC PANEL
Iron: 140 ug/dL (ref 42–145)
SATURATION RATIOS: 46.5 % (ref 20.0–50.0)
TRANSFERRIN: 215 mg/dL (ref 212.0–360.0)

## 2017-05-16 LAB — FERRITIN: FERRITIN: 114.1 ng/mL (ref 10.0–291.0)

## 2017-05-18 LAB — HM MAMMOGRAPHY

## 2017-05-22 ENCOUNTER — Ambulatory Visit: Payer: BLUE CROSS/BLUE SHIELD | Admitting: Internal Medicine

## 2017-05-23 ENCOUNTER — Encounter: Payer: Self-pay | Admitting: Internal Medicine

## 2017-05-23 ENCOUNTER — Ambulatory Visit (INDEPENDENT_AMBULATORY_CARE_PROVIDER_SITE_OTHER): Payer: BLUE CROSS/BLUE SHIELD | Admitting: Internal Medicine

## 2017-05-23 VITALS — BP 110/70 | HR 102 | Temp 98.6°F | Wt 155.0 lb

## 2017-05-23 DIAGNOSIS — Z79899 Other long term (current) drug therapy: Secondary | ICD-10-CM

## 2017-05-23 DIAGNOSIS — I10 Essential (primary) hypertension: Secondary | ICD-10-CM | POA: Diagnosis not present

## 2017-05-23 DIAGNOSIS — E785 Hyperlipidemia, unspecified: Secondary | ICD-10-CM | POA: Diagnosis not present

## 2017-05-23 DIAGNOSIS — R7989 Other specified abnormal findings of blood chemistry: Secondary | ICD-10-CM | POA: Diagnosis not present

## 2017-05-23 DIAGNOSIS — D649 Anemia, unspecified: Secondary | ICD-10-CM | POA: Diagnosis not present

## 2017-05-23 MED ORDER — BUTALBITAL-APAP-CAFFEINE 50-325-40 MG PO TABS: ORAL_TABLET | ORAL | 0 refills | Status: DC

## 2017-05-23 MED ORDER — ALPRAZOLAM 1 MG PO TABS: ORAL_TABLET | ORAL | 2 refills | Status: DC

## 2017-05-23 NOTE — Progress Notes (Signed)
Chief Complaint  Patient presents with  . Follow-up    HPI: Brooke Pennington 67 y.o. come in for Chronic disease management  medicatoin  HT  No se med  Taking 1/2 pill  Trying to eat healthier  mhas lost weight with this   M<ED USE alprazolam:  butalbital  Back to 10- per month had restriction on last rx that had to be removed   hld  Renal function  Mild anemia  Had uds pending     To have mammo and  Gyne and colon check  ROS: See pertinent positives and negatives per HPI.feeling healthier   Past Medical History:  Diagnosis Date  . Anal fissure    hx of same  . Dependence, barbiturates ? 05/01/2013   Have come to the conclusion that she is dependent on this medication based on her history and the fact that she feels better when she takes it   . Eczema   . H/O rectal polypectomy    ?  uncertain  if colonic or rectal colonsocopy 8 2007 gi [facility]  . Headache(784.0)   . Hypertension   . Lymphocytic gastritis    neg SBBX for celiac  in 8 2007   . Medication management failed tox screen 11/27/2013   Neg for xanax and butalbital on urine screen . 2 /15   . Menopause    age 63 with sx no hrt  . PMR (polymyalgia rheumatica) (HCC)    Possible diagnoses by primary care physician with her anemia and elevated sedimentation rate of 60 was on prednisone but stopped it herself and then felt fine    Family History  Problem Relation Age of Onset  . Coronary artery disease Father   . Heart disease Father   . COPD Mother   . Anemia Sister        needing transfusion some> vit b12 ?    Social History   Social History  . Marital status: Married    Spouse name: N/A  . Number of children: N/A  . Years of education: N/A   Social History Main Topics  . Smoking status: Never Smoker  . Smokeless tobacco: Never Used     Comment: never used tobacco  . Alcohol use 0.6 oz/week    1 Glasses of wine per week     Comment: ocassionally  . Drug use: No  . Sexual activity: Yes    Other Topics Concern  . 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  Has smoke detector and wears seat belts.  No firearms. No excess sun exposure. Sees dentist regularly . No depression      Only 1 car at this time   No etoh   Father passed dec 23 15 chf heart related age 64          Outpatient Medications Prior to Visit  Medication Sig Dispense Refill  . busPIRone (BUSPAR) 15 MG tablet TAKE 1 TABLET BY MOUTH 3 TIMES A DAY 90 tablet 5  . latanoprost (XALATAN) 0.005 % ophthalmic solution Place 1 drop into both eyes at bedtime.   3  . lisinopril-hydrochlorothiazide (PRINZIDE,ZESTORETIC) 20-12.5 MG tablet Take 0.5 tablets by mouth daily. Or as directed For hypertension 45 tablet 3  . MULTIPLE VITAMIN PO Take 1 tablet by mouth daily.    Marland Kitchen  tretinoin (RETIN-A) 0.1 % cream Apply 1 application topically at bedtime.   3  . ALPRAZolam (XANAX) 1 MG tablet TAKE 1 TABLET BY MOUTH 4 TIMES A DAY AS NEEDED 120 tablet 1  . ammonium lactate (LAC-HYDRIN) 12 % lotion Apply 1 application topically as needed.   0  . butalbital-acetaminophen-caffeine (FIORICET, ESGIC) 50-325-40 MG tablet Take 1-2 tabs  as needed.  Limit Regular Use. One fill per  60 days . 10 tablet 0   No facility-administered medications prior to visit.      EXAM:  BP 110/70 (BP Location: Right Arm, Patient Position: Sitting, Cuff Size: Normal)   Pulse (!) 102   Temp 98.6 F (37 C) (Oral)   Wt 155 lb (70.3 kg)   BMI 27.90 kg/m   Body mass index is 27.9 kg/m.  GENERAL: vitals reviewed and listed above, alert, oriented, appears well hydrated and in no acute distress HEENT: atraumatic, conjunctiva  clear, no obvious abnormalities on inspection of external nose and ears   NECK: no obvious masses on inspection palpation  LUNGS: clear to auscultation bilaterally, no wheezes, rales or rhonchi, good air movement CV:  HRRR, no clubbing cyanosis or  peripheral edema nl cap refill  MS: moves all extremities without noticeable focal  abnormality PSYCH: pleasant and cooperative, no obvious depression or anxiety  Looks calmler today  Lab Results  Component Value Date   WBC 5.0 05/16/2017   HGB 12.4 05/16/2017   HCT 36.8 05/16/2017   PLT 249.0 05/16/2017   GLUCOSE 79 05/16/2017   CHOL 204 (H) 05/16/2017   TRIG 125.0 05/16/2017   HDL 54.90 05/16/2017   LDLDIRECT 168.2 12/25/2012   LDLCALC 124 (H) 05/16/2017   ALT 9 01/06/2017   AST 14 01/06/2017   NA 136 05/16/2017   K 4.9 05/16/2017   CL 102 05/16/2017   CREATININE 1.28 (H) 05/16/2017   BUN 22 05/16/2017   CO2 32 05/16/2017   TSH 2.03 01/06/2017   BP Readings from Last 3 Encounters:  05/23/17 110/70  01/20/17 102/64  07/19/16 112/64   Wt Readings from Last 3 Encounters:  05/23/17 155 lb (70.3 kg)  01/20/17 163 lb 1.6 oz (74 kg)  07/19/16 175 lb 1.6 oz (79.4 kg)    ASSESSMENT AND PLAN:  Discussed the following assessment and plan:  Hyperlipidemia, unspecified hyperlipidemia type - muych improved lsi   Medication management - uds pending  Elevated serum creatinine - stable  - Plan: Basic metabolic panel  High risk medication use  HYPERTENSION, BENIGN - stable  may be able to dec dose med   or change  if cont good control and lsi weight loss - Plan: Basic metabolic panel  Mild anemia - improved  iron level nl now verall metabolic and personal factors are improved. Have her continue healthy lifestyle eating adding activity. 4 months BMP previsit.Follow K plan yearly checkup in April and then every 6 months visits. Consideration of decrease antihypertension medicine dose. -Patient advised to return or notify health care team  if  new concerns arise.  Patient Instructions   Glad you are doing better   Keep up the healthy diet changes .   Your blood work   shows  Better cholesterol.   Kidneys are the same.   Plan fu 4 mos   Med bmp       Burna Mortimer K. Panosh M.D.

## 2017-05-23 NOTE — Patient Instructions (Addendum)
  Glad you are doing better   Keep up the healthy diet changes .   Your blood work   shows  Better cholesterol.   Kidneys are the same.   Plan fu 4 mos   Med bmp

## 2017-05-31 LAB — DRUG ABUSE PANEL 10-50, U
AMPHETAMINES (1000 NG/ML SCRN): NEGATIVE
BARBITURATES: NEGATIVE
BENZODIAZEPINES: POSITIVE — AB
COCAINE METABOLITES: NEGATIVE
MARIJUANA MET (50 ng/mL SCRN): NEGATIVE
METHADONE: NEGATIVE
METHAQUALONE: NEGATIVE
OPIATES: NEGATIVE
PHENCYCLIDINE: NEGATIVE
PROPOXYPHENE: NEGATIVE

## 2017-06-12 ENCOUNTER — Telehealth: Payer: Self-pay | Admitting: Internal Medicine

## 2017-06-12 NOTE — Telephone Encounter (Signed)
° ° °  Pt said she has an appt in Jan 2019 and is asking if she will need to do a toxiology test at that time

## 2017-06-12 NOTE — Telephone Encounter (Signed)
° ° ° °  Pt ask if the below med can be refilled early   butalbital-acetaminophen-caffeine (FIORICET, ESGIC) 50-325-40 MG tablet

## 2017-06-12 NOTE — Telephone Encounter (Signed)
Please advise 

## 2017-06-13 NOTE — Telephone Encounter (Signed)
No  tox screen   Can refill esgic  Sept 24 18 or after

## 2017-06-15 MED ORDER — BUTALBITAL-APAP-CAFFEINE 50-325-40 MG PO TABS: ORAL_TABLET | ORAL | 0 refills | Status: DC

## 2017-06-15 NOTE — Telephone Encounter (Signed)
Pt following up on refill request. Pt aware ok to pick up rx  butalbital-acetaminophen-caffeine Marikay Alar, ESGIC) 50-325-40 MG tablet  On Monday 9/24   [id number] [place], [place] [facility] [address]

## 2017-06-15 NOTE — Telephone Encounter (Signed)
[facility] to fill on 06/19/17 or later.  Nothing further needed.

## 2017-06-19 LAB — HM COLONOSCOPY

## 2017-07-14 ENCOUNTER — Telehealth: Payer: Self-pay | Admitting: Internal Medicine

## 2017-07-14 NOTE — Telephone Encounter (Signed)
° ° ° ° °  Pt request refill of the following:   butalbital-acetaminophen-caffeine (FIORICET, ESGIC) 50-325-40 MG tablet   Phamacy:

## 2017-07-19 ENCOUNTER — Other Ambulatory Visit: Payer: Self-pay | Admitting: Emergency Medicine

## 2017-07-19 MED ORDER — BUTALBITAL-APAP-CAFFEINE 50-325-40 MG PO TABS: ORAL_TABLET | ORAL | 0 refills | Status: DC

## 2017-07-19 NOTE — Telephone Encounter (Signed)
May refill x1 

## 2017-07-19 NOTE — Telephone Encounter (Signed)
Patient would like a refill. Last refill 06/15/2017. Patient last OV 05/23/2017. Please advise.

## 2017-07-19 NOTE — Telephone Encounter (Signed)
Prescription has been sent to the pharmacy. 

## 2017-08-10 ENCOUNTER — Other Ambulatory Visit: Payer: Self-pay | Admitting: Internal Medicine

## 2017-08-10 NOTE — Telephone Encounter (Signed)
There is already a refill encounter open on these same meds and sent to Dr Fabian SharpPanosh today.  Will contact patient once filled.  Will hold in my box to follow up

## 2017-08-10 NOTE — Telephone Encounter (Signed)
Ok to refill  As pharmacy requested   Times

## 2017-08-10 NOTE — Telephone Encounter (Signed)
Copied from CRM [id number]. Topic: General - Other >> Aug 10, 2017 11:22 AM Crist InfanteHarrald, [name] J wrote: Pt requesting refill  ALPRAZolam Prudy Feeler(XANAX) 1 MG tablet butalbital-acetaminophen-caffeine (FIORICET, ESGIC) 50-325-40 MG tablet  Pt hopes Dr Fabian SharpPanosh will call in prior to her being out, because she will need by the holiday weekend (11/22) and we are closed. [id number] [place], [place] - [phone] (Phone) [phone] (Fax)

## 2017-08-10 NOTE — Telephone Encounter (Signed)
Refill request for Medication: Xanax 1mg  Last Filled: 05/23/17, #120 x 2 RF  Medication: Fioricet  Last Filled: 07/19/17, #10   Previous / Upcoming Appt: Last seen 05/23/17  Pharmacy is requesting refill to have on hold to fill after Thanksgiving.  Please advise Dr Fabian SharpPanosh, thanks.

## 2017-08-11 MED ORDER — ALPRAZOLAM 1 MG PO TABS: ORAL_TABLET | ORAL | 2 refills | Status: DC

## 2017-08-11 MED ORDER — BUTALBITAL-APAP-CAFFEINE 50-325-40 MG PO TABS: ORAL_TABLET | ORAL | 0 refills | Status: DC

## 2017-08-11 NOTE — Telephone Encounter (Signed)
This has been taken care of and Rx's sent. Nothing further needed.

## 2017-08-11 NOTE — Telephone Encounter (Signed)
Rx's phoned into pharmacy. Nothing further needed.

## 2017-09-08 ENCOUNTER — Other Ambulatory Visit: Payer: Self-pay | Admitting: Internal Medicine

## 2017-09-08 NOTE — Telephone Encounter (Signed)
Dr Panosh pt 

## 2017-09-11 NOTE — Telephone Encounter (Signed)
Pt requesting refill of Fioricet Last filled 08/11/17 Last seen 05/23/17  Please advise Dr Clent RidgesFry if you are okay with authorizing this refill in Dr Rosezella FloridaPanosh's absence. Thanks.

## 2017-09-12 ENCOUNTER — Telehealth: Payer: Self-pay | Admitting: Internal Medicine

## 2017-09-12 NOTE — Telephone Encounter (Signed)
Sent to Dr. Clent RidgesFry for approval Dr. Fabian SharpPanosh patient.

## 2017-09-12 NOTE — Telephone Encounter (Signed)
Call in #10 with no rf  

## 2017-09-12 NOTE — Telephone Encounter (Signed)
Copied from CRM [id number]. Topic: Quick Communication - Rx Refill/Question >> Sep 12, 2017  4:23 PM Jonette EvaBarksdale, Harvey B wrote: [facility]  butalbital-acetaminophen-caffeine Marikay Alar(FIORICET, ESGIC) 50-325-40 MG tablet [[id number]][[id number]] request, contact pharmacy if needed

## 2017-09-13 MED ORDER — BUTALBITAL-APAP-CAFFEINE 50-325-40 MG PO TABS: ORAL_TABLET | ORAL | 0 refills | Status: DC

## 2017-09-13 NOTE — Telephone Encounter (Signed)
Phoned into pharmacy. Nothing further needed.     Nelwyn SalisburyFry, Stephen A, MD 11 hours ago (9:43 PM) Call in #10 with no rf

## 2017-09-13 NOTE — Telephone Encounter (Signed)
Rx phoned in.  See telephone note 09/12/17 Nothing further needed.

## 2017-10-09 ENCOUNTER — Other Ambulatory Visit: Payer: Self-pay | Admitting: Internal Medicine

## 2017-10-10 NOTE — Telephone Encounter (Signed)
Last filled 09/13/17, #10 x 0 rf Last seen 05/23/17  Please advise Dr Fabian SharpPanosh, thanks.

## 2017-10-11 ENCOUNTER — Telehealth: Payer: Self-pay | Admitting: Internal Medicine

## 2017-10-11 NOTE — Telephone Encounter (Signed)
Requesting refill of Fioricet  LOV 05/23/17 with Dr. Fabian SharpPanosh  Last refill 09/13/17   #10

## 2017-10-11 NOTE — Telephone Encounter (Signed)
Copied from CRM [id number]. Topic: Quick Communication - See Telephone Encounter >> Oct 11, 2017  3:13 PM Arlyss Gandyichardson, Shannon Kirkendall N, NT wrote: CRM for notification. See Telephone encounter for: [facility] [place] [facility] to request a refill for the pt of  butalbital-acetaminophen-caffeine (FIORICET, ESGIC).   10/11/17.

## 2017-10-12 MED ORDER — BUTALBITAL-APAP-CAFFEINE 50-325-40 MG PO TABS: ORAL_TABLET | ORAL | 0 refills | Status: DC

## 2017-10-12 NOTE — Telephone Encounter (Signed)
Rx refilled  Nothing further needed.  

## 2017-10-12 NOTE — Telephone Encounter (Signed)
Refilled in telephone note.

## 2017-10-12 NOTE — Telephone Encounter (Signed)
Thought I already answered  Ok x 1

## 2017-10-12 NOTE — Telephone Encounter (Signed)
Ok to refill x 1  

## 2017-10-13 ENCOUNTER — Telehealth: Payer: Self-pay | Admitting: Family Medicine

## 2017-10-13 NOTE — Telephone Encounter (Signed)
Copied from CRM [id number]. Topic: General - Other >> Oct 13, 2017  2:06 PM Viviann SpareWhite, Selina wrote: Reason for CRM: Patient would like to know if Dr. Fabian SharpPanosh want her to do a Toxicology screening when she come in for her labs on 10/18/17 @ 11 am. She is requesting a call back to let her know [phone].

## 2017-10-13 NOTE — Telephone Encounter (Signed)
Not needed until   July august

## 2017-10-13 NOTE — Telephone Encounter (Signed)
Please advise Dr Panosh, thanks.   

## 2017-10-16 ENCOUNTER — Other Ambulatory Visit: Payer: BLUE CROSS/BLUE SHIELD

## 2017-10-17 NOTE — Telephone Encounter (Signed)
Copied from CRM [id number]. Topic: Quick Communication - See Telephone Encounter >> Oct 17, 2017  2:28 PM Alexander BergeronBarksdale, Harvey B wrote: Pt has rescheduled her labs for next week, but pt is coming in for a basic metabolic panel lab. Pt just wants clarity that the note you left was for to wait until July or August for these labs that were rescheduled or JUST FOR the toxicology screening. Contact pt to advise and leave a detailed message if no response

## 2017-10-17 NOTE — Telephone Encounter (Signed)
LM x 1 

## 2017-10-18 ENCOUNTER — Other Ambulatory Visit: Payer: BLUE CROSS/BLUE SHIELD

## 2017-10-18 NOTE — Telephone Encounter (Signed)
Left detailed message on machine making patient aware that the Toxicology lab is the only thing that was told to wait until July/Aug. If there are any other labs needed she could go ahead and get those but the Toxicology could wait until her follow up appt. Pt aware to call back if any questions. Nothing further needed.

## 2017-10-23 ENCOUNTER — Ambulatory Visit: Payer: BLUE CROSS/BLUE SHIELD | Admitting: Internal Medicine

## 2017-10-24 ENCOUNTER — Telehealth: Payer: Self-pay | Admitting: Internal Medicine

## 2017-10-24 ENCOUNTER — Encounter: Payer: Self-pay | Admitting: Internal Medicine

## 2017-10-24 NOTE — Telephone Encounter (Signed)
LM for patient to verify dosing of Buspirone >> 15mg  or 7.5mg  and instructions??

## 2017-10-25 ENCOUNTER — Other Ambulatory Visit: Payer: BLUE CROSS/BLUE SHIELD

## 2017-10-26 NOTE — Telephone Encounter (Signed)
Patient states she normally gets the 15mg  and it is cheaper for her but most pharmacy are out of it so the pharmacy gave her 7.5mg . She states she is ok with medications until she comes in on 11/08/17. Also wants to know when she comes in on 10/31/17 for lab work if Dr. Fabian SharpPanosh can go ahead and refill her alprazolam while she is already in the office.

## 2017-10-30 MED ORDER — ALPRAZOLAM 1 MG PO TABS: ORAL_TABLET | ORAL | 0 refills | Status: DC

## 2017-10-30 NOTE — Telephone Encounter (Signed)
Pt aware that the Xanax is being refilled.  Nothing further needed.

## 2017-10-30 NOTE — Telephone Encounter (Signed)
Okay, will hold refill of Buspirone until she is seen 2./13/19  Pt is requesting a refill of her Alprazolam before her upcoming OV Last filled 08/11/17, #120 x 2 rfs Please advise Dr Fabian SharpPanosh, thanks.

## 2017-10-30 NOTE — Telephone Encounter (Signed)
Ok to refill x 1  

## 2017-10-31 ENCOUNTER — Other Ambulatory Visit (INDEPENDENT_AMBULATORY_CARE_PROVIDER_SITE_OTHER): Payer: BLUE CROSS/BLUE SHIELD

## 2017-10-31 ENCOUNTER — Ambulatory Visit: Payer: BLUE CROSS/BLUE SHIELD | Admitting: Internal Medicine

## 2017-10-31 DIAGNOSIS — R7989 Other specified abnormal findings of blood chemistry: Secondary | ICD-10-CM

## 2017-10-31 DIAGNOSIS — I10 Essential (primary) hypertension: Secondary | ICD-10-CM | POA: Diagnosis not present

## 2017-10-31 LAB — BASIC METABOLIC PANEL
BUN: 21 mg/dL (ref 6–23)
CALCIUM: 9.4 mg/dL (ref 8.4–10.5)
CHLORIDE: 101 meq/L (ref 96–112)
CO2: 33 meq/L — AB (ref 19–32)
Creatinine, Ser: 1.3 mg/dL — ABNORMAL HIGH (ref 0.40–1.20)
GFR: 43.74 mL/min — ABNORMAL LOW (ref >60.00)
GLUCOSE: 68 mg/dL — AB (ref 70–99)
Potassium: 4.2 mEq/L (ref 3.5–5.1)
SODIUM: 141 meq/L (ref 135–145)

## 2017-11-07 ENCOUNTER — Other Ambulatory Visit: Payer: Self-pay | Admitting: Internal Medicine

## 2017-11-07 NOTE — Telephone Encounter (Signed)
Ok x 1

## 2017-11-07 NOTE — Telephone Encounter (Signed)
Last filled 10/12/17, #10 x 0rf Last OV 05/23/17  Please advise Dr Fabian SharpPanosh, thanks.

## 2017-11-07 NOTE — Telephone Encounter (Signed)
Copied from CRM [id number]. Topic: Quick Communication - Rx Refill/Question >> Nov 07, 2017 12:31 PM Lelon FrohlichGolden, Sahara Fujimoto, ArizonaRMA wrote: Medication: butalbital-acetaminophen-caffieine  50-325-40 mg     Has the patient contacted their pharmacy? yes   (Agent: If no, request that the patient contact the pharmacy for the refill.)   Preferred Pharmacy (with phone number or street name[facility] [address] [place]   Agent: Please be advised that RX refills may take up to 3 business days. We ask that you follow-up with your pharmacy.

## 2017-11-08 ENCOUNTER — Ambulatory Visit: Payer: BLUE CROSS/BLUE SHIELD | Admitting: Internal Medicine

## 2017-11-09 ENCOUNTER — Other Ambulatory Visit: Payer: Self-pay | Admitting: Internal Medicine

## 2017-11-09 NOTE — Telephone Encounter (Signed)
Phones are down, will call in once able

## 2017-11-10 MED ORDER — BUTALBITAL-APAP-CAFFEINE 50-325-40 MG PO TABS: ORAL_TABLET | ORAL | 0 refills | Status: DC

## 2017-11-10 NOTE — Telephone Encounter (Signed)
Rx phoned in  Pt aware.  Nothing further needed.

## 2017-11-21 ENCOUNTER — Ambulatory Visit: Payer: BLUE CROSS/BLUE SHIELD | Admitting: Internal Medicine

## 2017-11-21 ENCOUNTER — Encounter: Payer: Self-pay | Admitting: Internal Medicine

## 2017-11-21 ENCOUNTER — Other Ambulatory Visit: Payer: Self-pay | Admitting: Internal Medicine

## 2017-11-21 VITALS — BP 118/72 | HR 92 | Temp 98.0°F | Wt 154.8 lb

## 2017-11-21 DIAGNOSIS — R7989 Other specified abnormal findings of blood chemistry: Secondary | ICD-10-CM

## 2017-11-21 DIAGNOSIS — F411 Generalized anxiety disorder: Secondary | ICD-10-CM

## 2017-11-21 DIAGNOSIS — Z79899 Other long term (current) drug therapy: Secondary | ICD-10-CM

## 2017-11-21 DIAGNOSIS — E785 Hyperlipidemia, unspecified: Secondary | ICD-10-CM | POA: Diagnosis not present

## 2017-11-21 DIAGNOSIS — I10 Essential (primary) hypertension: Secondary | ICD-10-CM | POA: Diagnosis not present

## 2017-11-21 DIAGNOSIS — D649 Anemia, unspecified: Secondary | ICD-10-CM

## 2017-11-21 MED ORDER — ALPRAZOLAM 1 MG PO TABS: ORAL_TABLET | ORAL | 2 refills | Status: DC

## 2017-11-21 MED ORDER — BUSPIRONE HCL 7.5 MG PO TABS: 15.0000 mg | ORAL_TABLET | Freq: Three times a day (TID) | ORAL | 1 refills | Status: DC

## 2017-11-21 MED ORDER — BUTALBITAL-APAP-CAFFEINE 50-325-40 MG PO TABS: ORAL_TABLET | ORAL | 0 refills | Status: DC

## 2017-11-21 NOTE — Telephone Encounter (Signed)
Copied from CRM [id number]. Topic: General - Other >> Nov 21, 2017  3:57 PM Gerrianne ScalePayne, Angela L wrote: Reason for CRM: patient stating that the pharmacy hasn't received the medication on butalbital-acetaminophen-caffeine (FIORICET, ESGIC) 50-325-40 MG tablet please call pt when this has been sent to the pharmacy so that she can call them and tell them when she is coming to get it

## 2017-11-21 NOTE — Patient Instructions (Addendum)
Stay on 1/2 pill for  bp medication at this time consideration of decreasing dose at some point.   Let us know if we need to  change which mg of buspar to send in .    Will send in  Extra butalbital as requested for the month of march .   Plan cpx  Exam   In summer .   If on medicare will be called the welcome to medicare check up  .  Call ahead for messages to me  For lab orders so you can ge labs pre visit.

## 2017-11-21 NOTE — Progress Notes (Signed)
Chief Complaint  Patient presents with  . Follow-up    review labs,    HPI: LUEVENIA MCAVOY 67 y.o. come in for Chronic disease management    eval colon  No cancer and minor polyp.   Ob gyne  Pap pelvic if needed.  Transvaginal US>   bp med 1/2 pill  Per day  No falling    meds  Shortage of bupar 15 mg   May need  To get other  Needs refill alprazolam  Asks for  Refill on the  firoinal  To cover 2 months   ROS: See pertinent positives and negatives per HPI.  Past Medical History:  Diagnosis Date  . Anal fissure    hx of same  . Dependence, barbiturates ? 05/01/2013   Have come to the conclusion that she is dependent on this medication based on her history and the fact that she feels better when she takes it   . Eczema   . H/O rectal polypectomy    ?  uncertain  if colonic or rectal colonsocopy 8 2007 gi [place]  . Headache(784.0)   . Hypertension   . Lymphocytic gastritis    neg SBBX for celiac  in 8 2007   . Medication management failed tox screen 11/27/2013   Neg for xanax and butalbital on urine screen . 2 /15   . Menopause    age 44 with sx no hrt  . PMR (polymyalgia rheumatica) (HCC)    Possible diagnoses by primary care physician with her anemia and elevated sedimentation rate of 60 was on prednisone but stopped it herself and then felt fine    Family History  Problem Relation Age of Onset  . Coronary artery disease Father   . Heart disease Father   . COPD Mother   . Anemia Sister        needing transfusion some> vit b12 ?    Social History   Socioeconomic History  . Marital status: Married    Spouse name: None  . Number of children: None  . Years of education: None  . Highest education level: None  Social Needs  . Financial resource strain: None  . Food insecurity - worry: None  . Food insecurity - inability: None  . Transportation needs - medical: None  . Transportation needs - non-medical: None  Occupational History  . None  Tobacco Use   . Smoking status: Never Smoker  . Smokeless tobacco: Never Used  . Tobacco comment: never used tobacco  Substance and Sexual Activity  . Alcohol use: Yes    Alcohol/week: 0.6 oz    Types: 1 Glasses of wine per week    Comment: ocassionally  . Drug use: No  . Sexual activity: Yes  Other Topics Concern  . 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  Has smoke detector and wears seat belts.  No firearms. No excess sun exposure. Sees dentist regularly . No depression      Only 1 car at this time   No etoh   Father passed dec 23 15 chf heart related age 44       Outpatient Medications Prior to Visit  Medication Sig Dispense Refill  . busPIRone (BUSPAR) 15 MG tablet TAKE 1 TABLET BY MOUTH 3 TIMES A DAY 90 tablet 5  .  latanoprost (XALATAN) 0.005 % ophthalmic solution Place 1 drop into both eyes at bedtime.   3  . lisinopril-hydrochlorothiazide (PRINZIDE,ZESTORETIC) 20-12.5 MG tablet Take 0.5 tablets by mouth daily. Or as directed For hypertension 45 tablet 3  . MULTIPLE VITAMIN PO Take 1 tablet by mouth daily.    Marland Kitchen tretinoin (RETIN-A) 0.1 % cream Apply 1 application topically at bedtime.   3  . ALPRAZolam (XANAX) 1 MG tablet Take 1 tablet by mouth 4 times a day as needed. 120 tablet 0  . busPIRone (BUSPAR) 7.5 MG tablet TAKE 2 TABLETS BY MOUTH THREE TIMES DAILY 180 tablet 1  . butalbital-acetaminophen-caffeine (FIORICET, ESGIC) 50-325-40 MG tablet Take 1-2 tabs  as needed.  Limit Regular Use.  . 10 tablet 0   No facility-administered medications prior to visit.      EXAM:  BP 118/72 (BP Location: Right Arm, Patient Position: Sitting, Cuff Size: Normal)   Pulse 92   Temp 98 F (36.7 C) (Oral)   Wt 154 lb 12.8 oz (70.2 kg)   BMI 27.86 kg/m   Body mass index is 27.86 kg/m.  GENERAL: vitals reviewed and listed above, alert, oriented, appears well hydrated and in no  acute distress HEENT: atraumatic, conjunctiva  clear, no obvious abnormalities on inspection of external nose and ears  NECK: no obvious masses on inspection palpation  LUNGS: clear to auscultation bilaterally, no wheezes, rales or rhonchi, good air movement CV: HRRR, no clubbing cyanosis or  peripheral edema nl cap refill  MS: moves all extremities without noticeable focal  abnormality PSYCH: pleasant and cooperative, no obvious depression or anxiety Lab Results  Component Value Date   WBC 5.0 05/16/2017   HGB 12.4 05/16/2017   HCT 36.8 05/16/2017   PLT 249.0 05/16/2017   GLUCOSE 68 (L) 10/31/2017   CHOL 204 (H) 05/16/2017   TRIG 125.0 05/16/2017   HDL 54.90 05/16/2017   LDLDIRECT 168.2 12/25/2012   LDLCALC 124 (H) 05/16/2017   ALT 9 01/06/2017   AST 14 01/06/2017   NA 141 10/31/2017   K 4.2 10/31/2017   CL 101 10/31/2017   CREATININE 1.30 (H) 10/31/2017   BUN 21 10/31/2017   CO2 33 (H) 10/31/2017   TSH 2.03 01/06/2017   BP Readings from Last 3 Encounters:  11/21/17 118/72  05/23/17 110/70  01/20/17 102/64   Wt Readings from Last 3 Encounters:  11/21/17 154 lb 12.8 oz (70.2 kg)  05/23/17 155 lb (70.3 kg)  01/20/17 163 lb 1.6 oz (74 kg)   lab review  ASSESSMENT AND PLAN:  Discussed the following assessment and plan:  HYPERTENSION, BENIGN  Anxiety state  Long term prescription benzodiazepine use  Hyperlipidemia, unspecified hyperlipidemia type  Medication management  Mild anemia  Elevated serum creatinine Stable and now utd on mammo pap and colon .  Following creatinine  Ht and  Anxiety meds  Stable at this time   Total visit > 50% spent counseling and coordinating care as indicated in above note and in instructions to patient .  -Patient advised to return or notify health care team  if  new concerns arise.  Patient Instructions  Stay on 1/2 pill for  bp medication at this time consideration of decreasing dose at some point.   Let us know if we  need to  change which mg of buspar to send in .    Will send in  Extra butalbital as requested for the month of march .   Plan cpx  Exam  In summer .   If on medicare will be called the welcome to medicare check up  .  Call ahead for messages to me  For lab orders so you can ge labs pre visit.               Neta MendsWanda K. Kirstein Baxley M.D.

## 2017-11-23 NOTE — Telephone Encounter (Signed)
Pt states that the [facility] is [facility] because the prescription only take 1-2 tablets as needed and the pharmacy needs it to state 1-2 tablets as needed daily. Pt calling to see if this can be fixed. [facility] [place].

## 2017-11-23 NOTE — Telephone Encounter (Signed)
Please advise Dr Fabian SharpPanosh if okay to change sig to what the pharmacy requests.  !-2 tabs PRN DAILY instead of 1-2 tab PRN  Thanks.

## 2017-11-24 NOTE — Telephone Encounter (Signed)
Relation to pt: self  Call back number: [phone] [id number] [place], [place] - [address] [phone] (Phone) [phone] (Fax)     Reason for call:  Patient checking on the status of message below, please advise

## 2017-12-12 ENCOUNTER — Encounter: Payer: Self-pay | Admitting: Internal Medicine

## 2017-12-18 ENCOUNTER — Other Ambulatory Visit: Payer: Self-pay | Admitting: Internal Medicine

## 2017-12-18 NOTE — Telephone Encounter (Unsigned)
Copied from CRM [phone]. Topic: Quick Communication - See Telephone Encounter >> Dec 18, 2017  1:43 PM Waymon AmatoBurton, Donna F wrote: CRM for notification. See Telephone encounter for: 12/18/17.pt is needing a refill on butalbital -acetaminophen  [facility] [phone]

## 2017-12-19 MED ORDER — BUTALBITAL-APAP-CAFFEINE 50-325-40 MG PO TABS: ORAL_TABLET | ORAL | 0 refills | Status: DC

## 2017-12-19 NOTE — Telephone Encounter (Signed)
Butalbital-acetaminophen-caffiene refill Last OV: 11/21/17 Last Refill:11/21/17 10 tabs 0 RF [facility] [address] PCP: Dr Fabian SharpPanosh

## 2017-12-19 NOTE — Telephone Encounter (Signed)
Sent electronically 

## 2017-12-21 NOTE — Telephone Encounter (Signed)
Pt aware. Nothing further needed 

## 2018-01-08 ENCOUNTER — Other Ambulatory Visit: Payer: Self-pay | Admitting: Internal Medicine

## 2018-01-08 NOTE — Telephone Encounter (Signed)
Last OV: 11/21/17 PCP: Panosh [phone] [place], [place] - [address] [phone] (Phone) [phone] (Fax)

## 2018-01-08 NOTE — Telephone Encounter (Signed)
Copied from CRM [phone]. Topic: Quick Communication - Rx Refill/Question >> Jan 08, 2018  1:44 PM Percival SpanishKennedy, Cheryl W wrote: Medication  butalbital-acetaminophen-caffeine (FIORICET, ESGIC) 50-325-40 MG tablet    pt said she know the req is early    [facility] [place]   Agent: Please be advised that RX refills may take up to 3 business days. We ask that you follow-up with your pharmacy.

## 2018-01-10 NOTE — Telephone Encounter (Signed)
Pt calling to check status on Dr. Fabian SharpPanosh approving her Fioricet.

## 2018-01-10 NOTE — Telephone Encounter (Signed)
Copied from CRM [phone]. Topic: Quick Communication - Rx Refill/Question >> Jan 10, 2018 12:00 PM Jolayne Hainesaylor, Brittany L wrote: Patient said if this will be approved that would be great. She knows it is early.

## 2018-01-11 NOTE — Telephone Encounter (Signed)
Last prescribed 12/19/17, #10  Please advise Dr Fabian SharpPanosh, thanks.

## 2018-01-11 NOTE — Telephone Encounter (Signed)
Pt called in to follow up on request. Advise that request has been forwarded to provider. Pt would like to know if someone could let her know response when received.   [phone]

## 2018-01-15 MED ORDER — BUTALBITAL-APAP-CAFFEINE 50-325-40 MG PO TABS: ORAL_TABLET | ORAL | 0 refills | Status: DC

## 2018-01-15 NOTE — Telephone Encounter (Signed)
Pt aware. Nothing further needed 

## 2018-01-15 NOTE — Telephone Encounter (Signed)
Pt calling requesting refill be sent today - she is completely out today.  Pt aware that this has been sent in to the pharmacy already. Nothing further needed.

## 2018-01-15 NOTE — Telephone Encounter (Signed)
Sent in electronically .  

## 2018-02-07 ENCOUNTER — Other Ambulatory Visit: Payer: Self-pay | Admitting: Internal Medicine

## 2018-02-07 NOTE — Telephone Encounter (Signed)
Last given 01/15/18, #10 x 0rf Please advise Dr Fabian Sharp, thanks.

## 2018-02-09 NOTE — Telephone Encounter (Signed)
I left a detailed message at the pts cell number with this information.

## 2018-02-16 ENCOUNTER — Other Ambulatory Visit: Payer: Self-pay | Admitting: Internal Medicine

## 2018-02-16 NOTE — Telephone Encounter (Signed)
Xanax Last OV 11/21/17 Last filled 11/21/17 #120 x 2rf Upcoming visit 05/02/18  Please advise Dr Fabian Sharp, thanks.

## 2018-03-05 ENCOUNTER — Other Ambulatory Visit: Payer: Self-pay | Admitting: Internal Medicine

## 2018-03-07 DIAGNOSIS — H1012 Acute atopic conjunctivitis, left eye: Secondary | ICD-10-CM | POA: Diagnosis not present

## 2018-03-07 NOTE — Telephone Encounter (Signed)
Last OV on 11/21/2017

## 2018-03-08 NOTE — Telephone Encounter (Signed)
Patient calling to check on this medication refill. Requesting a call when this is completed. Please advise.

## 2018-03-09 ENCOUNTER — Telehealth: Payer: Self-pay | Admitting: Internal Medicine

## 2018-03-09 NOTE — Telephone Encounter (Signed)
Sent in electronically . today 

## 2018-03-09 NOTE — Telephone Encounter (Signed)
I left a detailed message at the pts work number with the information below.

## 2018-03-09 NOTE — Telephone Encounter (Signed)
Copied from CRM [id number]. Topic: Quick Communication - Rx Refill/Question >> Mar 09, 2018 10:47 AM Leafy Roobinson, Norma J wrote: Medication: generic fioricet  Has the patient contacted their pharmacy? Yes (Agent: If no, request that the patient contact the pharmacy for the refill.) pt is out. Pt would like a callback once med has been sent to pharm (Agent: If yes, when and what did the pharmacy advise?)  Preferred Pharmacy (with phone number [facility] street name[facility] [place] Agent: Please be advised that RX refills may take up to 3 business days. We ask that you follow-up with your pharmacy.

## 2018-03-09 NOTE — Telephone Encounter (Signed)
Sent in electronically .  

## 2018-03-14 ENCOUNTER — Other Ambulatory Visit: Payer: Self-pay | Admitting: Internal Medicine

## 2018-03-14 NOTE — Telephone Encounter (Signed)
Can we refill this? 

## 2018-04-02 ENCOUNTER — Other Ambulatory Visit: Payer: Self-pay | Admitting: Internal Medicine

## 2018-04-02 NOTE — Telephone Encounter (Signed)
Can we refill this? 

## 2018-04-06 NOTE — Telephone Encounter (Signed)
Sent in electronically .  

## 2018-04-11 ENCOUNTER — Telehealth: Payer: Self-pay | Admitting: Family Medicine

## 2018-04-11 NOTE — Telephone Encounter (Signed)
Yes please refill 

## 2018-04-11 NOTE — Telephone Encounter (Signed)
Refill request for Buspirone HCL 10 mg take 1 1/2 tablets three times daily, requested # 135, can we refill this?

## 2018-04-12 ENCOUNTER — Telehealth: Payer: Self-pay

## 2018-04-12 NOTE — Telephone Encounter (Signed)
Copied from CRM [id number]. Topic: General - Other >> Apr 12, 2018  2:26 PM Marylen PontoMcneil, Ja-Kwan wrote: Reason for CRM: Pt returned call to [place]. Pt states she is taking busPIRone (BUSPAR) 7.5 MG tablet and it cost her $47 for a month supply but she was advised by the pharmacist that the same medication in a 10 MG tablet will cost her around $10 for a month supply. Cb# [phone]

## 2018-04-12 NOTE — Telephone Encounter (Signed)
Per Dr. Fabian SharpPanosh we need to confirm exactly what dose she is currently taking and the amount needed? I left a voice message for pt to return our call with this information.

## 2018-04-13 NOTE — Telephone Encounter (Signed)
Sure   10 mg  Take ? 15 mg tid

## 2018-04-13 NOTE — Telephone Encounter (Signed)
Can we send in a script for new dose?

## 2018-04-13 NOTE — Telephone Encounter (Signed)
Pharmacy called and needs new escript for change.

## 2018-04-16 NOTE — Telephone Encounter (Signed)
I left another voice message for pt to return my call.  

## 2018-04-16 NOTE — Telephone Encounter (Signed)
I spoke with pharmacy, pt has already picked up script for the 7.5 mg , requesting a new script and they will put on hold for pt. Request for Buspar 10 mg take 1 1/2 tablet every day, dispense # 135, this will cost pt around $14.50, much cheaper if wrote this way.

## 2018-04-16 NOTE — Telephone Encounter (Signed)
This is a duplicate, see previous note for answer.

## 2018-04-17 MED ORDER — BUSPIRONE HCL 10 MG PO TABS: 15.0000 mg | ORAL_TABLET | Freq: Every day | ORAL | 0 refills | Status: DC

## 2018-04-17 NOTE — Telephone Encounter (Signed)
[facility] and left pt a message that new script was sent.

## 2018-04-17 NOTE — Addendum Note (Signed)
Addended by: Aniceto BossNIMMONS, SYLVIA A on: 04/17/2018 10:13 AM   Modules accepted: Orders

## 2018-04-17 NOTE — Telephone Encounter (Signed)
I agree with the change requested  For cost reasons

## 2018-05-01 NOTE — Progress Notes (Signed)
Chief Complaint  Patient presents with  . Annual Exam    Increased Bruising    HPI: Brooke Pennington 67 y.o. comes in today for  Welcome to medicare and Preventive Medicare exam/ wellness visit . And Chronic disease management    Doing fiarly well  burisng on arm and thigh etc if just has minor   Hits   No other bleeding pain asks if could be cancer  No fever  Ln other bleeding    BP not taking readings and took 1/2 med this am   No se no syncope.   Anxiety stable  Same doinsg taking  fioricet for ocass ha   In past she took many daily now ocass   She has gyne check with dexa soon    Check left ear clogged   No pain  No q tips  Working on losing weight  Health Maintenance  Topic Date Due  . Hepatitis C Screening  Oct 26, 1950  . HIV Screening  03/16/1968  . PNA vac Low Risk Adult (1 of 2 - PCV13) 03/16/2018  . INFLUENZA VACCINE  07/24/2018 (Originally 04/26/2018)  . PAP SMEAR  09/09/2018  . MAMMOGRAM  05/19/2019  . TETANUS/TDAP  01/18/2026  . COLONOSCOPY  06/20/2027  . DEXA SCAN  Completed   Health Maintenance Review LIFESTYLE:  Exercise:   Tobacco/ETS:n Alcohol: ocass rare  Sugar beverages: ocass  Sleep:6+   Drug use: no HH:2 no pets worries about sisters health never gets checked out     Hearing: ok after wax removed   Vision:  No limitations at present . Last eye check UTD  Safety:  Has smoke detector and wears seat belts.   No excess sun exposure. Sees dentist regularly.  Depression: No anhedonia unusual crying or depressive symptoms  Nutrition: Eats well balanced diet; adequate calcium and vitamin D. No swallowing chewing problems.  Injury: no major injuries in the last six months.  Other healthcare providers:  Reviewed today .  Social:  Lives with spouse married. No pets.  Husband is an Oncologist parameters: up-to-date  Reviewed   ADLS:   There are no problems or need for assistance  driving, feeding, obtaining food, dressing, toileting and  bathing, managing money using phone. She is independent.    ROS:  GEN/ HEENT: No fever, significant weight changes sweats headaches vision problems hearing changes, CV/ PULM; No chest pain shortness of breath cough, syncope,edema  change in exercise tolerance. GI /GU: No adominal pain, vomiting, change in bowel habits. No blood in the stool. No significant GU symptoms. SKIN/HEME: ,no acute skin rashes suspicious lesions or bleeding. No lymphadenopathy, nodules, masses.  NEURO/ PSYCH:  No neurologic signs such as weakness numbness. No depression anxiety. IMM/ Allergy: No unusual infections.  Allergy .   REST of 12 system review negative except as per HPI   Past Medical History:  Diagnosis Date  . Anal fissure    hx of same  . Dependence, barbiturates ? 05/01/2013   Have come to the conclusion that she is dependent on this medication based on her history and the fact that she feels better when she takes it   . Eczema   . H/O rectal polypectomy    ?  uncertain  if colonic or rectal colonsocopy 8 2007 gi [facility]  . Headache(784.0)   . Hypertension   . Lymphocytic gastritis    neg SBBX for celiac  in 8 2007   . Medication management failed tox screen 11/27/2013  Neg for xanax and butalbital on urine screen . 2 /15   . Menopause    age 48 with sx no hrt  . PMR (polymyalgia rheumatica) (HCC)    Possible diagnoses by primary care physician with her anemia and elevated sedimentation rate of 60 was on prednisone but stopped it herself and then felt fine    Family History  Problem Relation Age of Onset  . Coronary artery disease Father   . Heart disease Father   . COPD Mother   . Anemia Sister        needing transfusion some> vit b12 ?    Social History   Socioeconomic History  . Marital status: Married    Spouse name: Not on file  . Number of children: Not on file  . Years of education: Not on file  . Highest education level: Not on file  Occupational History  . Not on  file  Social Needs  . Financial resource strain: Not on file  . Food insecurity:    Worry: Not on file    Inability: Not on file  . Transportation needs:    Medical: Not on file    Non-medical: Not on file  Tobacco Use  . Smoking status: Never Smoker  . Smokeless tobacco: Never Used  . Tobacco comment: never used tobacco  Substance and Sexual Activity  . Alcohol use: Yes    Alcohol/week: 0.6 oz    Types: 1 Glasses of wine per week    Comment: ocassionally  . Drug use: No  . Sexual activity: Yes  Lifestyle  . Physical activity:    Days per week: Not on file    Minutes per session: Not on file  . Stress: Not on file  Relationships  . Social connections:    Talks on phone: Not on file    Gets together: Not on file    Attends religious service: Not on file    Active member of club or organization: Not on file    Attends meetings of clubs or organizations: Not on file    Relationship status: Not on file  Other Topics Concern  . 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  Has smoke detector and wears seat belts.  No firearms. No excess sun exposure. Sees dentist regularly . No depression      Only 1 car at this time   No etoh   Father passed dec 23 15 chf heart related age 62       Outpatient Encounter Medications as of 05/02/2018  Medication Sig  . ALPRAZolam (XANAX) 1 MG tablet TAKE 1 TABLET BY MOUTH FOUR TIMES A DAY AS NEEDED  . busPIRone (BUSPAR) 10 MG tablet Take 1.5 tablets (15 mg total) by mouth daily.  . butalbital-acetaminophen-caffeine (FIORICET, ESGIC) 50-325-40 MG tablet TAKE 1-2 TABLETS BY MOUTH AS NEEDED. LIMIT REGULAR USE.  Marland Kitchen latanoprost (XALATAN) 0.005 % ophthalmic solution Place 1 drop into both eyes at bedtime.   . MULTIPLE VITAMIN PO Take 1 tablet by mouth daily.  Marland Kitchen tretinoin (RETIN-A) 0.1 % cream Apply 1 application topically at  bedtime.   . [DISCONTINUED] ALPRAZolam (XANAX) 1 MG tablet TAKE 1 TABLET BY MOUTH FOUR TIMES A DAY AS NEEDED  . [DISCONTINUED] butalbital-acetaminophen-caffeine (FIORICET, ESGIC) 50-325-40 MG tablet TAKE 1-2 TABLETS BY MOUTH AS  NEEDED. LIMIT REGULAR USE.  . [DISCONTINUED] lisinopril-hydrochlorothiazide (PRINZIDE,ZESTORETIC) 20-12.5 MG tablet Take 0.5 tablets by mouth daily. Or as directed For hypertension  . lisinopril (PRINIVIL,ZESTRIL) 10 MG tablet Take 1 tablet (10 mg total) by mouth daily.   No facility-administered encounter medications on file as of 05/02/2018.     EXAM:  BP 104/62 (BP Location: Right Arm, Patient Position: Sitting, Cuff Size: Normal)   Pulse 76   Temp (!) 97.5 F (36.4 C) (Oral)   Ht 5' 2.25" (1.581 m)   Wt 149 lb 3.2 oz (67.7 kg)   BMI 27.07 kg/m   Body mass index is 27.07 kg/m.  Physical Exam: Vital signs reviewed QJF:HLKT is a well-developed well-nourished alert cooperative   who appears stated age in no acute distress.  HEENT: normocephalic atraumatic , Eyes: PERRL EOM's full, conjunctiva clear, Nares: paten,t no deformity discharge or tenderness., Ears: no deformity EAC's clear TMs with normal landmarks.  aftere large plug of wax  irriagted  From left eac Mouth: clear OP, no lesions, edema.  Moist mucous membranes. Dentition in adequate repair. NECK: supple without masses, thyromegaly or bruits. CHEST/PULM:  Clear to auscultation and percussion breath sounds equal no wheeze , rales or rhonchi. No chest wall deformities or tenderness. CV: PMI is nondisplaced, S1 S2 no gallops, murmurs, rubs. Peripheral pulses are full without delay.No JVD . Breast: normal by inspection . No dimpling, discharge, masses, tenderness or discharge . ABDOMEN: Bowel sounds normal nontender  No guard or rebound, no hepato splenomegal no CVA tenderness.   Extremtities:  No clubbing cyanosis or edema, no acute joint swelling or redness no focal atrophy NEURO:  Oriented x3, cranial  nerves 3-12 appear to be intact, no obvious focal weakness,gait within normal limits no abnormal reflexes or asymmetrical SKIN: No acute rashes normal turgor, color,   Superficial brusing  righ forearm and  Upper thichs small areas  No  petechiae. PSYCH: Oriented, good eye contact, minimal depression  cognition and judgment appear normal. LN: no cervical axillary inguinal adenopathy No noted deficits in memory, attention, and speech.   Lab Results  Component Value Date   WBC 5.4 05/02/2018   HGB 12.4 05/02/2018   HCT 36.2 05/02/2018   PLT 252.0 05/02/2018   GLUCOSE 82 05/02/2018   CHOL 237 (H) 05/02/2018   TRIG 78.0 05/02/2018   HDL 67.20 05/02/2018   LDLDIRECT 168.2 12/25/2012   LDLCALC 154 (H) 05/02/2018   ALT 8 05/02/2018   AST 12 05/02/2018   NA 140 05/02/2018   K 4.0 05/02/2018   CL 102 05/02/2018   CREATININE 1.26 (H) 05/02/2018   BUN 25 (H) 05/02/2018   CO2 33 (H) 05/02/2018   TSH 1.40 05/02/2018   The 10-year ASCVD risk score Mikey Bussing DC Jr., et al., 2013) is: 5%   Values used to calculate the score:     Age: 72 years     Sex: Female     Is Non-Hispanic African American: No     Diabetic: No     Tobacco smoker: No     Systolic Blood Pressure: 625 mmHg     Is BP treated: Yes     HDL Cholesterol: 67.2 mg/dL     Total Cholesterol: 237 mg/dL Wt Readings from Last 3 Encounters:  05/02/18 149 lb 3.2 oz (67.7 kg)  11/21/17 154 lb 12.8 oz (70.2 kg)  05/23/17 155 lb (70.3 kg)    ASSESSMENT AND PLAN:  Discussed the following assessment and plan:  Welcome to Medicare preventive  visit  Visit for preventive health examination  Medication management - Plan: Basic metabolic panel, CBC with Differential/Platelet, Hepatic function panel, Lipid panel, TSH, HIV antibody, POCT Urinalysis Dipstick (Automated)  Anxiety state - Plan: Basic metabolic panel, CBC with Differential/Platelet, Hepatic function panel, Lipid panel, TSH, HIV antibody, POCT Urinalysis Dipstick  (Automated)  Mild anemia - Plan: Basic metabolic panel, CBC with Differential/Platelet, Hepatic function panel, Lipid panel, TSH, HIV antibody, POCT Urinalysis Dipstick (Automated)  Essential hypertension - bp nice and los and has lost weight  dc current  and try low dose lisin 10 mg  and then fu in 3-4 months  - Plan: Basic metabolic panel, CBC with Differential/Platelet, Hepatic function panel, Lipid panel, TSH, HIV antibody, POCT Urinalysis Dipstick (Automated)  Easy bruising - not alarmingdistribution no asa  perhaps buspar as a seratonin effect?  ck blood labs etc  - Plan: Basic metabolic panel, CBC with Differential/Platelet, Hepatic function panel, Lipid panel, TSH, HIV antibody, POCT Urinalysis Dipstick (Automated)  Need for hepatitis C screening test - Plan: Hepatitis C antibody  Screening for HIV (human immunodeficiency virus) - Plan: HIV antibody  Pneumococcal vaccination declined by patient  Long term prescription benzodiazepine use  Elevated serum creatinine  Wax in ear - left removed  Renal insufficiency After  years since no med changed will be  Stopping the uds    Unless medication change or needed ramp up.   Patient Care Team: Temitope Flammer, Standley Brooking, MD as PCP - General (Internal Medicine) Lorelei Pont, Rudell Cobb, MD as Consulting Physician (Oncology)  Patient Instructions  Will notify you  of labs when available. The bruising doesn't seem alarming .  But  Checking labs to assess .  wll refill alprax and butabitol   cahnge bp medication to plain lisinopril 10 mg per day since your bp is so good on rarwe use of medication.   Plan  rov in 3-4 months to check bp and medications .       Health Maintenance, Female Adopting a healthy lifestyle and getting preventive care can go a long way to promote health and wellness. Talk with your health care provider about what schedule of regular examinations is right for you. This is a good chance for you to check in with  your provider about disease prevention and staying healthy. In between checkups, there are plenty of things you can do on your own. Experts have done a lot of research about which lifestyle changes and preventive measures are most likely to keep you healthy. Ask your health care provider for more information. Weight and diet Eat a healthy diet  Be sure to include plenty of vegetables, fruits, low-fat dairy products, and lean protein.  Do not eat a lot of foods high in solid fats, added sugars, or salt.  Get regular exercise. This is one of the most important things you can do for your health. ? Most adults should exercise for at least 150 minutes each week. The exercise should increase your heart rate and make you sweat (moderate-intensity exercise). ? Most adults should also do strengthening exercises at least twice a week. This is in addition to the moderate-intensity exercise.  Maintain a healthy weight  Body mass index (BMI) is a measurement that can be used to identify possible weight problems. It estimates body fat based on height and weight. Your health care provider can help determine your BMI and help you achieve or maintain a healthy weight.  For females 37 years of age and older: ?  A BMI below 18.5 is considered underweight. ? A BMI of 18.5 to 24.9 is normal. ? A BMI of 25 to 29.9 is considered overweight. ? A BMI of 30 and above is considered obese.  Watch levels of cholesterol and blood lipids  You should start having your blood tested for lipids and cholesterol at 65 years of age, then have this test every 5 years.  You may need to have your cholesterol levels checked more often if: ? Your lipid or cholesterol levels are high. ? You are older than 65 years of age. ? You are at high risk for heart disease.  Cancer screening Lung Cancer  Lung cancer screening is recommended for adults 52-30 years old who are at high risk for lung cancer because of a history of  smoking.  A yearly low-dose CT scan of the lungs is recommended for people who: ? Currently smoke. ? Have quit within the past 15 years. ? Have at least a 30-pack-year history of smoking. A pack year is smoking an average of one pack of cigarettes a day for 1 year.  Yearly screening should continue until it has been 15 years since you quit.  Yearly screening should stop if you develop a health problem that would prevent you from having lung cancer treatment.  Breast Cancer  Practice breast self-awareness. This means understanding how your breasts normally appear and feel.  It also means doing regular breast self-exams. Let your health care provider know about any changes, no matter how small.  If you are in your 20s or 30s, you should have a clinical breast exam (CBE) by a health care provider every 1-3 years as part of a regular health exam.  If you are 70 or older, have a CBE every year. Also consider having a breast X-ray (mammogram) every year.  If you have a family history of breast cancer, talk to your health care provider about genetic screening.  If you are at high risk for breast cancer, talk to your health care provider about having an MRI and a mammogram every year.  Breast cancer gene (BRCA) assessment is recommended for women who have family members with BRCA-related cancers. BRCA-related cancers include: ? Breast. ? Ovarian. ? Tubal. ? Peritoneal cancers.  Results of the assessment will determine the need for genetic counseling and BRCA1 and BRCA2 testing.  Cervical Cancer Your health care provider may recommend that you be screened regularly for cancer of the pelvic organs (ovaries, uterus, and vagina). This screening involves a pelvic examination, including checking for microscopic changes to the surface of your cervix (Pap test). You may be encouraged to have this screening done every 3 years, beginning at age 49.  For women ages 50-65, health care providers may  recommend pelvic exams and Pap testing every 3 years, or they may recommend the Pap and pelvic exam, combined with testing for human papilloma virus (HPV), every 5 years. Some types of HPV increase your risk of cervical cancer. Testing for HPV may also be done on women of any age with unclear Pap test results.  Other health care providers may not recommend any screening for nonpregnant women who are considered low risk for pelvic cancer and who do not have symptoms. Ask your health care provider if a screening pelvic exam is right for you.  If you have had past treatment for cervical cancer or a condition that could lead to cancer, you need Pap tests and screening for cancer for at least 20  years after your treatment. If Pap tests have been discontinued, your risk factors (such as having a new sexual partner) need to be reassessed to determine if screening should resume. Some women have medical problems that increase the chance of getting cervical cancer. In these cases, your health care provider may recommend more frequent screening and Pap tests.  Colorectal Cancer  This type of cancer can be detected and often prevented.  Routine colorectal cancer screening usually begins at 65 years of age and continues through 65 years of age.  Your health care provider may recommend screening at an earlier age if you have risk factors for colon cancer.  Your health care provider may also recommend using home test kits to check for hidden blood in the stool.  A small camera at the end of a tube can be used to examine your colon directly (sigmoidoscopy or colonoscopy). This is done to check for the earliest forms of colorectal cancer.  Routine screening usually begins at age 53.  Direct examination of the colon should be repeated every 5-10 years through 65 years of age. However, you may need to be screened more often if early forms of precancerous polyps or small growths are found.  Skin Cancer  Check  your skin from head to toe regularly.  Tell your health care provider about any new moles or changes in moles, especially if there is a change in a mole's shape or color.  Also tell your health care provider if you have a mole that is larger than the size of a pencil eraser.  Always use sunscreen. Apply sunscreen liberally and repeatedly throughout the day.  Protect yourself by wearing long sleeves, pants, a wide-brimmed hat, and sunglasses whenever you are outside.  Heart disease, diabetes, and high blood pressure  High blood pressure causes heart disease and increases the risk of stroke. High blood pressure is more likely to develop in: ? People who have blood pressure in the high end of the normal range (130-139/85-89 mm Hg). ? People who are overweight or obese. ? People who are African American.  If you are 58-5 years of age, have your blood pressure checked every 3-5 years. If you are 69 years of age or older, have your blood pressure checked every year. You should have your blood pressure measured twice-once when you are at a hospital or clinic, and once when you are not at a hospital or clinic. Record the average of the two measurements. To check your blood pressure when you are not at a hospital or clinic, you can use: ? An automated blood pressure machine at a pharmacy. ? A home blood pressure monitor.  If you are between 75 years and 65 years old, ask your health care provider if you should take aspirin to prevent strokes.  Have regular diabetes screenings. This involves taking a blood sample to check your fasting blood sugar level. ? If you are at a normal weight and have a low risk for diabetes, have this test once every three years after 66 years of age. ? If you are overweight and have a high risk for diabetes, consider being tested at a younger age or more often. Preventing infection Hepatitis B  If you have a higher risk for hepatitis B, you should be screened for this  virus. You are considered at high risk for hepatitis B if: ? You were born in a country where hepatitis B is common. Ask your health care provider which countries are  considered high risk. ? Your parents were born in a high-risk country, and you have not been immunized against hepatitis B (hepatitis B vaccine). ? You have HIV or AIDS. ? You use needles to inject street drugs. ? You live with someone who has hepatitis B. ? You have had sex with someone who has hepatitis B. ? You get hemodialysis treatment. ? You take certain medicines for conditions, including cancer, organ transplantation, and autoimmune conditions.  Hepatitis C  Blood testing is recommended for: ? Everyone born from [birth year] through 1963. ? Anyone with known risk factors for hepatitis C.  Sexually transmitted infections (STIs)  You should be screened for sexually transmitted infections (STIs) including gonorrhea and chlamydia if: ? You are sexually active and are younger than 65 years of age. ? You are older than 65 years of age and your health care provider tells you that you are at risk for this type of infection. ? Your sexual activity has changed since you were last screened and you are at an increased risk for chlamydia or gonorrhea. Ask your health care provider if you are at risk.  If you do not have HIV, but are at risk, it may be recommended that you take a prescription medicine daily to prevent HIV infection. This is called pre-exposure prophylaxis (PrEP). You are considered at risk if: ? You are sexually active and do not regularly use condoms or know the HIV status of your partner(s). ? You take drugs by injection. ? You are sexually active with a partner who has HIV.  Talk with your health care provider about whether you are at high risk of being infected with HIV. If you choose to begin PrEP, you should first be tested for HIV. You should then be tested every 3 months for as long as you are taking  PrEP. Pregnancy  If you are premenopausal and you may become pregnant, ask your health care provider about preconception counseling.  If you may become pregnant, take 400 to 800 micrograms (mcg) of folic acid every day.  If you want to prevent pregnancy, talk to your health care provider about birth control (contraception). Osteoporosis and menopause  Osteoporosis is a disease in which the bones lose minerals and strength with aging. This can result in serious bone fractures. Your risk for osteoporosis can be identified using a bone density scan.  If you are 61 years of age or older, or if you are at risk for osteoporosis and fractures, ask your health care provider if you should be screened.  Ask your health care provider whether you should take a calcium or vitamin D supplement to lower your risk for osteoporosis.  Menopause may have certain physical symptoms and risks.  Hormone replacement therapy may reduce some of these symptoms and risks. Talk to your health care provider about whether hormone replacement therapy is right for you. Follow these instructions at home:  Schedule regular health, dental, and eye exams.  Stay current with your immunizations.  Do not use any tobacco products including cigarettes, chewing tobacco, or electronic cigarettes.  If you are pregnant, do not drink alcohol.  If you are breastfeeding, limit how much and how often you drink alcohol.  Limit alcohol intake to no more than 1 drink per day for nonpregnant women. One drink equals 12 ounces of beer, 5 ounces of wine, or 1 ounces of hard liquor.  Do not use street drugs.  Do not share needles.  Ask your health care provider for  help if you need support or information about quitting drugs.  Tell your health care provider if you often feel depressed.  Tell your health care provider if you have ever been abused or do not feel safe at home. This information is not intended to replace advice given  to you by your health care provider. Make sure you discuss any questions you have with your health care provider. Document Released: 03/28/2011 Document Revised: 02/18/2016 Document Reviewed: 06/16/2015 Elsevier Interactive Patient Education  2018 [place]. Panosh M.D.

## 2018-05-02 ENCOUNTER — Encounter: Payer: Self-pay | Admitting: Internal Medicine

## 2018-05-02 ENCOUNTER — Ambulatory Visit (INDEPENDENT_AMBULATORY_CARE_PROVIDER_SITE_OTHER): Payer: Medicare Other | Admitting: Internal Medicine

## 2018-05-02 VITALS — BP 104/62 | HR 76 | Temp 97.5°F | Ht 62.25 in | Wt 149.2 lb

## 2018-05-02 DIAGNOSIS — I10 Essential (primary) hypertension: Secondary | ICD-10-CM | POA: Diagnosis not present

## 2018-05-02 DIAGNOSIS — Z0000 Encounter for general adult medical examination without abnormal findings: Secondary | ICD-10-CM

## 2018-05-02 DIAGNOSIS — Z79899 Other long term (current) drug therapy: Secondary | ICD-10-CM

## 2018-05-02 DIAGNOSIS — F411 Generalized anxiety disorder: Secondary | ICD-10-CM | POA: Diagnosis not present

## 2018-05-02 DIAGNOSIS — D649 Anemia, unspecified: Secondary | ICD-10-CM

## 2018-05-02 DIAGNOSIS — N289 Disorder of kidney and ureter, unspecified: Secondary | ICD-10-CM

## 2018-05-02 DIAGNOSIS — R233 Spontaneous ecchymoses: Secondary | ICD-10-CM | POA: Insufficient documentation

## 2018-05-02 DIAGNOSIS — R7989 Other specified abnormal findings of blood chemistry: Secondary | ICD-10-CM

## 2018-05-02 DIAGNOSIS — R238 Other skin changes: Secondary | ICD-10-CM | POA: Diagnosis not present

## 2018-05-02 DIAGNOSIS — Z2821 Immunization not carried out because of patient refusal: Secondary | ICD-10-CM

## 2018-05-02 DIAGNOSIS — Z1159 Encounter for screening for other viral diseases: Secondary | ICD-10-CM

## 2018-05-02 DIAGNOSIS — H6120 Impacted cerumen, unspecified ear: Secondary | ICD-10-CM

## 2018-05-02 DIAGNOSIS — Z114 Encounter for screening for human immunodeficiency virus [HIV]: Secondary | ICD-10-CM

## 2018-05-02 LAB — BASIC METABOLIC PANEL
BUN: 25 mg/dL — ABNORMAL HIGH (ref 6–23)
CALCIUM: 9.8 mg/dL (ref 8.4–10.5)
CO2: 33 meq/L — AB (ref 19–32)
Chloride: 102 mEq/L (ref 96–112)
Creatinine, Ser: 1.26 mg/dL — ABNORMAL HIGH (ref 0.40–1.20)
GFR: 45.28 mL/min — ABNORMAL LOW (ref >60.00)
GLUCOSE: 82 mg/dL (ref 70–99)
POTASSIUM: 4 meq/L (ref 3.5–5.1)
SODIUM: 140 meq/L (ref 135–145)

## 2018-05-02 LAB — POC URINALSYSI DIPSTICK (AUTOMATED)
Bilirubin, UA: NEGATIVE
Blood, UA: NEGATIVE
Glucose, UA: NEGATIVE
Ketones, UA: NEGATIVE
Leukocytes, UA: NEGATIVE
NITRITE UA: NEGATIVE
PH UA: 6 (ref 5.0–8.0)
PROTEIN UA: NEGATIVE
SPEC GRAV UA: 1.015 (ref 1.010–1.025)
Urobilinogen, UA: 0.2 E.U./dL

## 2018-05-02 LAB — CBC WITH DIFFERENTIAL/PLATELET
Basophils Absolute: 0 10*3/uL (ref 0.0–0.1)
Basophils Relative: 0.4 % (ref 0.0–3.0)
EOS ABS: 0 10*3/uL (ref 0.0–0.7)
Eosinophils Relative: 0.4 % (ref 0.0–5.0)
HCT: 36.2 % (ref 36.0–46.0)
HEMOGLOBIN: 12.4 g/dL (ref 12.0–15.0)
Lymphocytes Relative: 27.6 % (ref 12.0–46.0)
Lymphs Abs: 1.5 10*3/uL (ref 0.7–4.0)
MCHC: 34.2 g/dL (ref 30.0–36.0)
MCV: 95.3 fl (ref 78.0–100.0)
MONO ABS: 0.3 10*3/uL (ref 0.1–1.0)
Monocytes Relative: 5.8 % (ref 3.0–12.0)
NEUTROS ABS: 3.6 10*3/uL (ref 1.4–7.7)
Neutrophils Relative %: 65.8 % (ref 43.0–77.0)
PLATELETS: 252 10*3/uL (ref 150.0–400.0)
RBC: 3.8 Mil/uL — ABNORMAL LOW (ref 3.87–5.11)
RDW: 12.9 % (ref 11.5–15.5)
WBC: 5.4 10*3/uL (ref 4.0–10.5)

## 2018-05-02 LAB — HEPATIC FUNCTION PANEL
ALT: 8 U/L (ref 0–35)
AST: 12 U/L (ref 0–37)
Albumin: 4.5 g/dL (ref 3.5–5.2)
Alkaline Phosphatase: 56 U/L (ref 39–117)
BILIRUBIN DIRECT: 0.1 mg/dL (ref 0.0–0.3)
BILIRUBIN TOTAL: 0.5 mg/dL (ref 0.2–1.2)
Total Protein: 6.7 g/dL (ref 6.0–8.3)

## 2018-05-02 LAB — LIPID PANEL
Cholesterol: 237 mg/dL — ABNORMAL HIGH (ref 0–200)
HDL: 67.2 mg/dL (ref >39.00)
LDL CALC: 154 mg/dL — AB (ref 0–99)
NonHDL: 169.37
Total CHOL/HDL Ratio: 4
Triglycerides: 78 mg/dL (ref 0.0–149.0)
VLDL: 15.6 mg/dL (ref 0.0–40.0)

## 2018-05-02 LAB — TSH: TSH: 1.4 u[IU]/mL (ref 0.35–4.50)

## 2018-05-02 MED ORDER — LISINOPRIL 10 MG PO TABS: 10.0000 mg | ORAL_TABLET | Freq: Every day | ORAL | 1 refills | Status: DC

## 2018-05-02 MED ORDER — BUTALBITAL-APAP-CAFFEINE 50-325-40 MG PO TABS: ORAL_TABLET | ORAL | 0 refills | Status: DC

## 2018-05-02 MED ORDER — ALPRAZOLAM 1 MG PO TABS: ORAL_TABLET | ORAL | 2 refills | Status: DC

## 2018-05-02 NOTE — Patient Instructions (Addendum)
Will notify you  of labs when available. The bruising doesn't seem alarming .  But  Checking labs to assess .  wll refill alprax and butabitol   cahnge bp medication to plain lisinopril 10 mg per day since your bp is so good on rarwe use of medication.   Plan  rov in 3-4 months to check bp and medications .       Health Maintenance, Female Adopting a healthy lifestyle and getting preventive care can go a long way to promote health and wellness. Talk with your health care provider about what schedule of regular examinations is right for you. This is a good chance for you to check in with your provider about disease prevention and staying healthy. In between checkups, there are plenty of things you can do on your own. Experts have done a lot of research about which lifestyle changes and preventive measures are most likely to keep you healthy. Ask your health care provider for more information. Weight and diet Eat a healthy diet  Be sure to include plenty of vegetables, fruits, low-fat dairy products, and lean protein.  Do not eat a lot of foods high in solid fats, added sugars, or salt.  Get regular exercise. This is one of the most important things you can do for your health. ? Most adults should exercise for at least 150 minutes each week. The exercise should increase your heart rate and make you sweat (moderate-intensity exercise). ? Most adults should also do strengthening exercises at least twice a week. This is in addition to the moderate-intensity exercise.  Maintain a healthy weight  Body mass index (BMI) is a measurement that can be used to identify possible weight problems. It estimates body fat based on height and weight. Your health care provider can help determine your BMI and help you achieve or maintain a healthy weight.  For females 71 years of age and older: ? A BMI below 18.5 is considered underweight. ? A BMI of 18.5 to 24.9 is normal. ? A BMI of 25 to 29.9 is  considered overweight. ? A BMI of 30 and above is considered obese.  Watch levels of cholesterol and blood lipids  You should start having your blood tested for lipids and cholesterol at 65 years of age, then have this test every 5 years.  You may need to have your cholesterol levels checked more often if: ? Your lipid or cholesterol levels are high. ? You are older than 65 years of age. ? You are at high risk for heart disease.  Cancer screening Lung Cancer  Lung cancer screening is recommended for adults 38-48 years old who are at high risk for lung cancer because of a history of smoking.  A yearly low-dose CT scan of the lungs is recommended for people who: ? Currently smoke. ? Have quit within the past 15 years. ? Have at least a 30-pack-year history of smoking. A pack year is smoking an average of one pack of cigarettes a day for 1 year.  Yearly screening should continue until it has been 15 years since you quit.  Yearly screening should stop if you develop a health problem that would prevent you from having lung cancer treatment.  Breast Cancer  Practice breast self-awareness. This means understanding how your breasts normally appear and feel.  It also means doing regular breast self-exams. Let your health care provider know about any changes, no matter how small.  If you are in your 92s or  56s, you should have a clinical breast exam (CBE) by a health care provider every 1-3 years as part of a regular health exam.  If you are 66 or older, have a CBE every year. Also consider having a breast X-ray (mammogram) every year.  If you have a family history of breast cancer, talk to your health care provider about genetic screening.  If you are at high risk for breast cancer, talk to your health care provider about having an MRI and a mammogram every year.  Breast cancer gene (BRCA) assessment is recommended for women who have family members with BRCA-related cancers.  BRCA-related cancers include: ? Breast. ? Ovarian. ? Tubal. ? Peritoneal cancers.  Results of the assessment will determine the need for genetic counseling and BRCA1 and BRCA2 testing.  Cervical Cancer Your health care provider may recommend that you be screened regularly for cancer of the pelvic organs (ovaries, uterus, and vagina). This screening involves a pelvic examination, including checking for microscopic changes to the surface of your cervix (Pap test). You may be encouraged to have this screening done every 3 years, beginning at age 61.  For women ages 94-65, health care providers may recommend pelvic exams and Pap testing every 3 years, or they may recommend the Pap and pelvic exam, combined with testing for human papilloma virus (HPV), every 5 years. Some types of HPV increase your risk of cervical cancer. Testing for HPV may also be done on women of any age with unclear Pap test results.  Other health care providers may not recommend any screening for nonpregnant women who are considered low risk for pelvic cancer and who do not have symptoms. Ask your health care provider if a screening pelvic exam is right for you.  If you have had past treatment for cervical cancer or a condition that could lead to cancer, you need Pap tests and screening for cancer for at least 20 years after your treatment. If Pap tests have been discontinued, your risk factors (such as having a new sexual partner) need to be reassessed to determine if screening should resume. Some women have medical problems that increase the chance of getting cervical cancer. In these cases, your health care provider may recommend more frequent screening and Pap tests.  Colorectal Cancer  This type of cancer can be detected and often prevented.  Routine colorectal cancer screening usually begins at 65 years of age and continues through 65 years of age.  Your health care provider may recommend screening at an earlier age if  you have risk factors for colon cancer.  Your health care provider may also recommend using home test kits to check for hidden blood in the stool.  A small camera at the end of a tube can be used to examine your colon directly (sigmoidoscopy or colonoscopy). This is done to check for the earliest forms of colorectal cancer.  Routine screening usually begins at age 67.  Direct examination of the colon should be repeated every 5-10 years through 65 years of age. However, you may need to be screened more often if early forms of precancerous polyps or small growths are found.  Skin Cancer  Check your skin from head to toe regularly.  Tell your health care provider about any new moles or changes in moles, especially if there is a change in a mole's shape or color.  Also tell your health care provider if you have a mole that is larger than the size of a pencil  eraser.  Always use sunscreen. Apply sunscreen liberally and repeatedly throughout the day.  Protect yourself by wearing long sleeves, pants, a wide-brimmed hat, and sunglasses whenever you are outside.  Heart disease, diabetes, and high blood pressure  High blood pressure causes heart disease and increases the risk of stroke. High blood pressure is more likely to develop in: ? People who have blood pressure in the high end of the normal range (130-139/85-89 mm Hg). ? People who are overweight or obese. ? People who are African American.  If you are 28-33 years of age, have your blood pressure checked every 3-5 years. If you are 67 years of age or older, have your blood pressure checked every year. You should have your blood pressure measured twice-once when you are at a hospital or clinic, and once when you are not at a hospital or clinic. Record the average of the two measurements. To check your blood pressure when you are not at a hospital or clinic, you can use: ? An automated blood pressure machine at a pharmacy. ? A home blood  pressure monitor.  If you are between 70 years and 35 years old, ask your health care provider if you should take aspirin to prevent strokes.  Have regular diabetes screenings. This involves taking a blood sample to check your fasting blood sugar level. ? If you are at a normal weight and have a low risk for diabetes, have this test once every three years after 65 years of age. ? If you are overweight and have a high risk for diabetes, consider being tested at a younger age or more often. Preventing infection Hepatitis B  If you have a higher risk for hepatitis B, you should be screened for this virus. You are considered at high risk for hepatitis B if: ? You were born in a country where hepatitis B is common. Ask your health care provider which countries are considered high risk. ? Your parents were born in a high-risk country, and you have not been immunized against hepatitis B (hepatitis B vaccine). ? You have HIV or AIDS. ? You use needles to inject street drugs. ? You live with someone who has hepatitis B. ? You have had sex with someone who has hepatitis B. ? You get hemodialysis treatment. ? You take certain medicines for conditions, including cancer, organ transplantation, and autoimmune conditions.  Hepatitis C  Blood testing is recommended for: ? Everyone born from [birth year] through 1963. ? Anyone with known risk factors for hepatitis C.  Sexually transmitted infections (STIs)  You should be screened for sexually transmitted infections (STIs) including gonorrhea and chlamydia if: ? You are sexually active and are younger than 65 years of age. ? You are older than 65 years of age and your health care provider tells you that you are at risk for this type of infection. ? Your sexual activity has changed since you were last screened and you are at an increased risk for chlamydia or gonorrhea. Ask your health care provider if you are at risk.  If you do not have HIV, but are at risk,  it may be recommended that you take a prescription medicine daily to prevent HIV infection. This is called pre-exposure prophylaxis (PrEP). You are considered at risk if: ? You are sexually active and do not regularly use condoms or know the HIV status of your partner(s). ? You take drugs by injection. ? You are sexually active with a partner who has HIV.  Talk with your health care provider about whether you are at high risk of being infected with HIV. If you choose to begin PrEP, you should first be tested for HIV. You should then be tested every 3 months for as long as you are taking PrEP. Pregnancy  If you are premenopausal and you may become pregnant, ask your health care provider about preconception counseling.  If you may become pregnant, take 400 to 800 micrograms (mcg) of folic acid every day.  If you want to prevent pregnancy, talk to your health care provider about birth control (contraception). Osteoporosis and menopause  Osteoporosis is a disease in which the bones lose minerals and strength with aging. This can result in serious bone fractures. Your risk for osteoporosis can be identified using a bone density scan.  If you are 74 years of age or older, or if you are at risk for osteoporosis and fractures, ask your health care provider if you should be screened.  Ask your health care provider whether you should take a calcium or vitamin D supplement to lower your risk for osteoporosis.  Menopause may have certain physical symptoms and risks.  Hormone replacement therapy may reduce some of these symptoms and risks. Talk to your health care provider about whether hormone replacement therapy is right for you. Follow these instructions at home:  Schedule regular health, dental, and eye exams.  Stay current with your immunizations.  Do not use any tobacco products including cigarettes, chewing tobacco, or electronic cigarettes.  If you are pregnant, do not drink  alcohol.  If you are breastfeeding, limit how much and how often you drink alcohol.  Limit alcohol intake to no more than 1 drink per day for nonpregnant women. One drink equals 12 ounces of beer, 5 ounces of wine, or 1 ounces of hard liquor.  Do not use street drugs.  Do not share needles.  Ask your health care provider for help if you need support or information about quitting drugs.  Tell your health care provider if you often feel depressed.  Tell your health care provider if you have ever been abused or do not feel safe at home. This information is not intended to replace advice given to you by your health care provider. Make sure you discuss any questions you have with your health care provider. Document Released: 03/28/2011 Document Revised: 02/18/2016 Document Reviewed: 06/16/2015 Elsevier Interactive Patient Education  [employer].

## 2018-05-03 LAB — HEPATITIS C ANTIBODY
HEP C AB: NONREACTIVE
SIGNAL TO CUT-OFF: 0.11 (ref <1.00)

## 2018-05-03 LAB — HIV ANTIBODY (ROUTINE TESTING W REFLEX): HIV 1&2 Ab, 4th Generation: NONREACTIVE

## 2018-05-11 DIAGNOSIS — H401132 Primary open-angle glaucoma, bilateral, moderate stage: Secondary | ICD-10-CM | POA: Diagnosis not present

## 2018-05-21 ENCOUNTER — Telehealth: Payer: Self-pay | Admitting: *Deleted

## 2018-05-21 NOTE — Telephone Encounter (Signed)
Pt returning call re: VM left x 2 today. Pt states she received lab results last week. NT does not see any other messages. Please advise: [phone]

## 2018-05-22 NOTE — Telephone Encounter (Signed)
Calls received were duplicate calls.  Pt aware that Nothing further needed.

## 2018-05-27 ENCOUNTER — Telehealth: Payer: Self-pay | Admitting: Internal Medicine

## 2018-05-29 ENCOUNTER — Other Ambulatory Visit: Payer: Self-pay | Admitting: Internal Medicine

## 2018-05-29 NOTE — Addendum Note (Signed)
Addended by: Maisie Fus on: 05/29/2018 01:32 PM   Modules accepted: Orders

## 2018-05-29 NOTE — Telephone Encounter (Signed)
Please advise Dr Fabian Sharp, thanks.  Last refill 05/02/18 #10 Upcoming OV 08/02/18

## 2018-05-29 NOTE — Addendum Note (Signed)
Addended by: Maisie Fus on: 05/29/2018 01:33 PM   Modules accepted: Orders

## 2018-05-29 NOTE — Telephone Encounter (Signed)
Did this not get sent in?   See  Refill on sept 3  Please advise

## 2018-05-29 NOTE — Telephone Encounter (Signed)
Pt called in and is confused on how she was suppose to be taking this med.  She would like to talk to a nurse and would like to know how she should be taking this med?  She stated she was complete out of it

## 2018-05-30 ENCOUNTER — Telehealth: Payer: Self-pay | Admitting: Internal Medicine

## 2018-05-30 MED ORDER — BUSPIRONE HCL 10 MG PO TABS: 15.0000 mg | ORAL_TABLET | Freq: Three times a day (TID) | ORAL | 0 refills | Status: DC

## 2018-05-30 MED ORDER — BUTALBITAL-APAP-CAFFEINE 50-325-40 MG PO TABS: ORAL_TABLET | ORAL | 0 refills | Status: DC

## 2018-05-30 NOTE — Telephone Encounter (Signed)
Yes see other message Sent in electronically .

## 2018-05-30 NOTE — Telephone Encounter (Addendum)
Pt calling to see if you have sent in the butalbital-acetaminophen-caffeine (FIORICET, ESGIC) 50-325-40 MG tablet yet?

## 2018-05-30 NOTE — Telephone Encounter (Signed)
Pt aware via voicemail that Rx has been called in Nothing further needed.

## 2018-05-30 NOTE — Telephone Encounter (Signed)
Pt states she was going by her old prescription for the buspar, Which was 7.5 tab. 2 tabs TID (which is 15 mg TID) So she was taking the new Rx  busPIRone (BUSPAR) 10 MG tablet This same way. She is taking 2/10 mg tab TID. Pt states she did not realize this was a 90 day supply and she did not know the Rx had changed. Pt needs advice on what to do now.

## 2018-05-30 NOTE — Addendum Note (Signed)
Addended by: Maisie Fus on: 05/30/2018 04:31 PM   Modules accepted: Orders

## 2018-05-30 NOTE — Telephone Encounter (Signed)
LM for patient -- whats needed regarding the Buspar

## 2018-05-30 NOTE — Telephone Encounter (Signed)
Copied from CRM [id number]. Topic: Quick Communication - See Telephone Encounter >> May 30, 2018 11:11 AM Maisie Fus, CMA wrote: CRM for notification. See Telephone encounter for: 05/29/18. LM to return call, pt called about her medication Buspar -- whats needed?

## 2018-05-30 NOTE — Addendum Note (Signed)
Addended byBerniece Andreas K on: 05/30/2018 04:44 PM   Modules accepted: Orders

## 2018-05-30 NOTE — Telephone Encounter (Addendum)
Spoke with patient, discussed correct dosing of Buspar Pt was changed in July to 10mg  (take 15mg  TID) and the Rx was sent with the incorrect sig - Rx was sent as 15mg  QD not 15mg  TID.   New Rx sent for 30 days supply of the 10mg  (15mg  TID) #135  Pt aware that this has been sent to the pharmacy and her medlist updated.   Nothing further needed.

## 2018-05-30 NOTE — Telephone Encounter (Signed)
Sent in electronically .  

## 2018-05-30 NOTE — Telephone Encounter (Addendum)
No it did not. Has to be signed off by you as it is controlled.  Rx does say it was printed but it did not print. Rx is pending to be E-Scribed. Please advise Dr Fabian Sharp, thanks.

## 2018-05-31 NOTE — Telephone Encounter (Signed)
Nothing further needed. This has been handled

## 2018-06-01 ENCOUNTER — Telehealth: Payer: Self-pay | Admitting: Family Medicine

## 2018-06-01 NOTE — Telephone Encounter (Signed)
Copied from CRM [id number]. Topic: Inquiry >> Jun 01, 2018 11:10 AM Alexander Bergeron B wrote: Reason for CRM: pt called to ask questions about an appt she had on 05/02/18; contact to advise

## 2018-06-01 NOTE — Telephone Encounter (Signed)
Called and spoke with pt and she wanted to call back and discuss the Hep c and the HIV labs.  She is aware of these results and nothing further is needed.

## 2018-06-22 ENCOUNTER — Other Ambulatory Visit: Payer: Self-pay | Admitting: Internal Medicine

## 2018-06-26 NOTE — Telephone Encounter (Signed)
Patient is calling for a check in on her refills since Dr Fabian Sharp is out of the office. She requested these on Friday 9/27 and just wanted to make sure they would be refilled with her being out of the office. Please Advise.

## 2018-06-27 NOTE — Telephone Encounter (Signed)
Last refill 05/30/18 Last office visit 05/02/18 Okay to fill?

## 2018-06-27 NOTE — Telephone Encounter (Signed)
Pt calling to check on status of her butalbital-acetaminophen-caffeine (FIORICET, ESGIC) 50-325-40 MG tablet 10 tab   [id number] - [place], [place] - [address] [phone] (Phone) [phone] (Fax)

## 2018-06-27 NOTE — Telephone Encounter (Signed)
Call in #10 tabs

## 2018-06-28 ENCOUNTER — Other Ambulatory Visit: Payer: Self-pay | Admitting: Family Medicine

## 2018-06-28 DIAGNOSIS — Z780 Asymptomatic menopausal state: Secondary | ICD-10-CM | POA: Diagnosis not present

## 2018-06-28 DIAGNOSIS — Z6826 Body mass index (BMI) 26.0-26.9, adult: Secondary | ICD-10-CM | POA: Diagnosis not present

## 2018-06-28 DIAGNOSIS — Z1231 Encounter for screening mammogram for malignant neoplasm of breast: Secondary | ICD-10-CM | POA: Diagnosis not present

## 2018-06-28 DIAGNOSIS — Z01419 Encounter for gynecological examination (general) (routine) without abnormal findings: Secondary | ICD-10-CM | POA: Diagnosis not present

## 2018-06-28 DIAGNOSIS — M8588 Other specified disorders of bone density and structure, other site: Secondary | ICD-10-CM | POA: Diagnosis not present

## 2018-06-28 LAB — HM MAMMOGRAPHY

## 2018-06-28 LAB — HM DEXA SCAN

## 2018-06-28 MED ORDER — BUTALBITAL-APAP-CAFFEINE 50-325-40 MG PO TABS: ORAL_TABLET | ORAL | 0 refills | Status: DC

## 2018-07-11 ENCOUNTER — Encounter: Payer: Self-pay | Admitting: Internal Medicine

## 2018-07-17 ENCOUNTER — Other Ambulatory Visit: Payer: Self-pay | Admitting: *Deleted

## 2018-07-17 NOTE — Telephone Encounter (Signed)
Copied from CRM [id number]. Topic: General - Other >> Jul 17, 2018  3:50 PM Herby Abraham C wrote: Reason for CRM: pt is requesting a call back at : [phone]

## 2018-07-18 NOTE — Telephone Encounter (Signed)
Pt is calling to follow up on Mammogram results - normal Bone density scan - Osteopenia decreased from 10/2015 scan Notes from dr Leida Lauth - scanned in under media 06/28/18  Pt is not tolerating medications she was placed on following her Bone density  Calcium citrate Plus D3 - 630mg /500IU per serving - 2 tablets daily Centrum Silver 50+ Women  - 1 daily dose contains 1000IU VitD and 300mg  Calcium  Pt reports having severe bloating and constipation Pt states that she was so bloated that she could not see her feet. Stopped taking after 1 week of taking.   Pt is wanting to know if there is something else she can take for bone strength that will not be so harsh on her.   Please advise Dr Fabian Sharp, thanks.

## 2018-07-18 NOTE — Telephone Encounter (Signed)
Additional request:  Pt also requesting a refill of Fioricet  Last refilled 06/28/18 #10 x 0 refills.  Pt states that she was wondering if this could be refilled and she will not send a refill request for the month of November.

## 2018-07-18 NOTE — Telephone Encounter (Signed)
LM for call back

## 2018-07-20 MED ORDER — BUTALBITAL-APAP-CAFFEINE 50-325-40 MG PO TABS: ORAL_TABLET | ORAL | 0 refills | Status: DC

## 2018-07-20 NOTE — Telephone Encounter (Signed)
Try skipping the calcium and just take  Vit d 1000 iu per day .  Will send in  Early refill as requested for November

## 2018-07-23 DIAGNOSIS — D1801 Hemangioma of skin and subcutaneous tissue: Secondary | ICD-10-CM | POA: Diagnosis not present

## 2018-07-23 DIAGNOSIS — L2089 Other atopic dermatitis: Secondary | ICD-10-CM | POA: Diagnosis not present

## 2018-07-23 DIAGNOSIS — D225 Melanocytic nevi of trunk: Secondary | ICD-10-CM | POA: Diagnosis not present

## 2018-07-25 ENCOUNTER — Other Ambulatory Visit: Payer: Self-pay | Admitting: Internal Medicine

## 2018-07-25 NOTE — Telephone Encounter (Signed)
Lm for patient.  

## 2018-07-26 NOTE — Telephone Encounter (Signed)
Patient is aware 

## 2018-07-27 NOTE — Telephone Encounter (Signed)
Last filled 05/02/18 #120 x 2 refills Next Appt With Family Medicine Berniece Andreas, MD) 08/02/2018 at 11:00 AM  Please advise Dr Fabian Sharp, thanks.

## 2018-07-27 NOTE — Telephone Encounter (Signed)
Sent in electronically .  

## 2018-07-31 ENCOUNTER — Telehealth: Payer: Self-pay | Admitting: Internal Medicine

## 2018-07-31 DIAGNOSIS — Z5181 Encounter for therapeutic drug level monitoring: Secondary | ICD-10-CM

## 2018-07-31 NOTE — Telephone Encounter (Signed)
Copied from CRM [id number]. Topic: General - Other >> Jul 31, 2018 10:16 AM Brooke Pennington wrote: Patient is calling in wanting to  kn ow if she needs to do toxicology test on 08/15/2018 when she comes in for her visit. She is requesting a call back

## 2018-07-31 NOTE — Telephone Encounter (Signed)
Yes  Please  Order  uds  topxicology  screen for    Next visit

## 2018-07-31 NOTE — Telephone Encounter (Signed)
Patient is aware 

## 2018-07-31 NOTE — Telephone Encounter (Signed)
Left message on machine for patient to return our call. Lab ordered. CRM

## 2018-08-02 ENCOUNTER — Ambulatory Visit: Payer: Medicare Other | Admitting: Internal Medicine

## 2018-08-13 ENCOUNTER — Encounter: Payer: Self-pay | Admitting: Internal Medicine

## 2018-08-14 NOTE — Progress Notes (Signed)
Chief Complaint  Patient presents with  . Follow-up    no new concerns    HPI: Rolanda Jayeresa A Parr 67 y.o. come in for Chronic disease management    BP: taking  10 mg per day . Lisinopril    Trying to   Still dec weight in a healthy manner .  Not checking   Not exercising   As much even though has a treadmill. Weight stable   Med anxiety the same  Asks for refills of the butalbital     no new swellings not using nsaids   ROS: See pertinent positives and negatives per HPI.  Past Medical History:  Diagnosis Date  . Anal fissure    hx of same  . Dependence, barbiturates ? 05/01/2013   Have come to the conclusion that she is dependent on this medication based on her history and the fact that she feels better when she takes it   . Eczema   . H/O rectal polypectomy    ?  uncertain  if colonic or rectal colonsocopy 8 2007 gi [place]  . Headache(784.0)   . Hypertension   . Lymphocytic gastritis    neg SBBX for celiac  in 8 2007   . Medication management failed tox screen 11/27/2013   Neg for xanax and butalbital on urine screen . 2 /15   . Menopause    age 67 with sx no hrt  . PMR (polymyalgia rheumatica) (HCC)    Possible diagnoses by primary care physician with her anemia and elevated sedimentation rate of 60 was on prednisone but stopped it herself and then felt fine    Family History  Problem Relation Age of Onset  . Coronary artery disease Father   . Heart disease Father   . COPD Mother   . Anemia Sister        needing transfusion some> vit b12 ?    Social History   Socioeconomic History  . Marital status: Married    Spouse name: Not on file  . Number of children: Not on file  . Years of education: Not on file  . Highest education level: Not on file  Occupational History  . Not on file  Social Needs  . Financial resource strain: Not on file  . Food insecurity:    Worry: Not on file    Inability: Not on file  . Transportation needs:    Medical: Not on file     Non-medical: Not on file  Tobacco Use  . Smoking status: Never Smoker  . Smokeless tobacco: Never Used  . Tobacco comment: never used tobacco  Substance and Sexual Activity  . Alcohol use: Yes    Alcohol/week: 1.0 standard drinks    Types: 1 Glasses of wine per week    Comment: ocassionally  . Drug use: No  . Sexual activity: Yes  Lifestyle  . Physical activity:    Days per week: Not on file    Minutes per session: Not on file  . Stress: Not on file  Relationships  . Social connections:    Talks on phone: Not on file    Gets together: Not on file    Attends religious service: Not on file    Active member of club or organization: Not on file    Attends meetings of clubs or organizations: Not on file    Relationship status: Not on file  Other Topics Concern  . 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  Has smoke detector and wears seat belts.  No firearms. No excess sun exposure. Sees dentist regularly . No depression      Only 1 car at this time   No etoh   Father passed dec 23 15 chf heart related age 7       Outpatient Medications Prior to Visit  Medication Sig Dispense Refill  . ALPRAZolam (XANAX) 1 MG tablet TAKE 1 TABLET BY MOUTH FOUR TIMES A DAY AS NEEDED 120 tablet 2  . busPIRone (BUSPAR) 10 MG tablet TAKE 1.5 TABLETS BY MOUTH 3 TIMES DAILY 405 tablet 0  . latanoprost (XALATAN) 0.005 % ophthalmic solution Place 1 drop into both eyes at bedtime.   3  . lisinopril (PRINIVIL,ZESTRIL) 10 MG tablet Take 1 tablet (10 mg total) by mouth daily. 90 tablet 1  . MULTIPLE VITAMIN PO Take 1 tablet by mouth daily.    Marland Kitchen tretinoin (RETIN-A) 0.1 % cream Apply 1 application topically at bedtime.   3  . butalbital-acetaminophen-caffeine (FIORICET, ESGIC) 50-325-40 MG tablet Take 1-2 tablets by mouth as needed. 10 tablet 0   No facility-administered  medications prior to visit.      EXAM:  BP 102/60 (BP Location: Left Arm, Patient Position: Sitting, Cuff Size: Normal)   Pulse 80   Temp 97.7 F (36.5 C) (Oral)   Wt 149 lb 9.6 oz (67.9 kg)   BMI 27.14 kg/m   Body mass index is 27.14 kg/m.  GENERAL: vitals reviewed and listed above, alert, oriented, appears well hydrated and in no acute distress anxious  HEENT: atraumatic, conjunctiva  clear, no obvious abnormalities on inspection of external nose and ears OP : no lesion edema or exudate  NECK: no obvious masses on inspection palpation  LUNGS: clear to auscultation bilaterally, no wheezes, rales or rhonchi, good air movement CV: HRRR, no clubbing cyanosis or  peripheral edema nl cap refill  MS: moves all extremities without noticeable focal  abnormality PSYCH: pleasant and cooperative, no obvious depression very anxious  When disc renal US Lab Results  Component Value Date   WBC 5.4 05/02/2018   HGB 12.4 05/02/2018   HCT 36.2 05/02/2018   PLT 252.0 05/02/2018   GLUCOSE 82 05/02/2018   CHOL 237 (H) 05/02/2018   TRIG 78.0 05/02/2018   HDL 67.20 05/02/2018   LDLDIRECT 168.2 12/25/2012   LDLCALC 154 (H) 05/02/2018   ALT 8 05/02/2018   AST 12 05/02/2018   NA 140 05/02/2018   K 4.0 05/02/2018   CL 102 05/02/2018   CREATININE 1.26 (H) 05/02/2018   BUN 25 (H) 05/02/2018   CO2 33 (H) 05/02/2018   TSH 1.40 05/02/2018   BP Readings from Last 3 Encounters:  08/15/18 102/60  05/02/18 104/62  11/21/17 118/72   Wt Readings from Last 3 Encounters:  08/15/18 149 lb 9.6 oz (67.9 kg)  05/02/18 149 lb 3.2 oz (67.7 kg)  11/21/17 154 lb 12.8 oz (70.2 kg)   The 10-year ASCVD risk score Denman George DC Jr., et al., 2013) is: 4.8%   Values used to calculate the score:     Age: 65 years     Sex: Female     Is Non-Hispanic African American: No     Diabetic: No     Tobacco smoker: No     Systolic Blood Pressure: 102 mmHg  Is BP treated: Yes     HDL Cholesterol: 67.2 mg/dL     Total  Cholesterol: 237 mg/dL   ASSESSMENT AND PLAN:  Discussed the following assessment and plan:  Renal insufficiency - Plan: POCT Urinalysis Dipstick (Automated), Protein / creatinine ratio, urine, Basic metabolic panel, CBC with Differential/Platelet, Lipid panel, US Renal  Essential hypertension - Plan: Basic metabolic panel, CBC with Differential/Platelet, Lipid panel, US Renal  Medication management - Plan: Basic metabolic panel, CBC with Differential/Platelet, Lipid panel  Hyperlipidemia, unspecified hyperlipidemia type - Plan: Basic metabolic panel, CBC with Differential/Platelet, Lipid panel  Encounter for therapeutic drug monitoring - Plan: Pain Mgmt, Profile 8 w/Conf, U reviewed  Last   Gfrs   At this time remain on the acei  Check ua dn prot screen    Get renal ultrasound  Prolonged disc about this testing as she is anxious about testing in general and  health status  She will remain off of   NSAID and have good bp control .   Will place order for   Renal US  At   Med center facility as closer to her  Residence .  I fall ok then rov in 4 months with  Bmp lipids cbc pre visit  -Patient advised to return or notify health care team  if  new concerns arise. Total visit > 50% spent counseling and coordinating care as indicated in above note and in instructions to patient .    Patient Instructions  Weights and  Exercise may help bones and blood pressure.   Stay on the lisinopril.   Will be contacted  About  Kidney ultrasound to check image of kidneys .    NO antiinflammatories of the NSAID group.   If all ok then   Plan lab and rov in 3-4 months ( bmp and lipid panel )  Continue lifestyle intervention healthy eating and exercise .  Neta Mends. Lulia Schriner M.D.

## 2018-08-15 ENCOUNTER — Ambulatory Visit: Payer: Medicare Other | Admitting: Internal Medicine

## 2018-08-15 ENCOUNTER — Encounter: Payer: Self-pay | Admitting: Internal Medicine

## 2018-08-15 ENCOUNTER — Telehealth: Payer: Self-pay | Admitting: Internal Medicine

## 2018-08-15 VITALS — BP 102/60 | HR 80 | Temp 97.7°F | Wt 149.6 lb

## 2018-08-15 DIAGNOSIS — E785 Hyperlipidemia, unspecified: Secondary | ICD-10-CM | POA: Diagnosis not present

## 2018-08-15 DIAGNOSIS — N289 Disorder of kidney and ureter, unspecified: Secondary | ICD-10-CM | POA: Diagnosis not present

## 2018-08-15 DIAGNOSIS — I10 Essential (primary) hypertension: Secondary | ICD-10-CM | POA: Diagnosis not present

## 2018-08-15 DIAGNOSIS — Z5181 Encounter for therapeutic drug level monitoring: Secondary | ICD-10-CM | POA: Diagnosis not present

## 2018-08-15 DIAGNOSIS — Z79899 Other long term (current) drug therapy: Secondary | ICD-10-CM

## 2018-08-15 DIAGNOSIS — F411 Generalized anxiety disorder: Secondary | ICD-10-CM

## 2018-08-15 LAB — POC URINALSYSI DIPSTICK (AUTOMATED)
BILIRUBIN UA: NEGATIVE
Blood, UA: NEGATIVE
GLUCOSE UA: NEGATIVE
Ketones, UA: NEGATIVE
Leukocytes, UA: NEGATIVE
NITRITE UA: NEGATIVE
Protein, UA: NEGATIVE
Spec Grav, UA: 1.01 (ref 1.010–1.025)
UROBILINOGEN UA: 0.2 U/dL
pH, UA: 6 (ref 5.0–8.0)

## 2018-08-15 MED ORDER — BUTALBITAL-APAP-CAFFEINE 50-325-40 MG PO TABS: ORAL_TABLET | ORAL | 1 refills | Status: DC

## 2018-08-15 NOTE — Telephone Encounter (Signed)
Brooke ShookLaquasha Pennington, [facility] called to ask if they can have authorization to refill the patient's Fioricet early, I advised I will call the office to see if anyone is available. I called and spoke to [place], [facility] who asked me to transfer the call, the call was transferred successfully.

## 2018-08-15 NOTE — Patient Instructions (Addendum)
Weights and  Exercise may help bones and blood pressure.   Stay on the lisinopril.   Will be contacted  About  Kidney ultrasound to check image of kidneys .    NO antiinflammatories of the NSAID group.   If all ok then   Plan lab and rov in 3-4 months ( bmp and lipid panel )  Continue lifestyle intervention healthy eating and exercise .

## 2018-08-16 ENCOUNTER — Other Ambulatory Visit: Payer: Self-pay | Admitting: Internal Medicine

## 2018-08-18 LAB — PAIN MGMT, PROFILE 8 W/CONF, U
6 ACETYLMORPHINE: NEGATIVE ng/mL (ref <10)
ALCOHOL METABOLITES: NEGATIVE ng/mL (ref <500)
Alphahydroxyalprazolam: 945 ng/mL — ABNORMAL HIGH (ref <25)
Alphahydroxymidazolam: NEGATIVE ng/mL (ref <50)
Alphahydroxytriazolam: NEGATIVE ng/mL (ref <50)
Aminoclonazepam: NEGATIVE ng/mL (ref <25)
Amphetamines: NEGATIVE ng/mL (ref <500)
BUPRENORPHINE, URINE: NEGATIVE ng/mL (ref <5)
Benzodiazepines: POSITIVE ng/mL — AB (ref <100)
COCAINE METABOLITE: NEGATIVE ng/mL (ref <150)
Creatinine: 45.5 mg/dL
HYDROXYETHYLFLURAZEPAM: NEGATIVE ng/mL (ref <50)
Lorazepam: NEGATIVE ng/mL (ref <50)
MARIJUANA METABOLITE: NEGATIVE ng/mL (ref <20)
MDMA: NEGATIVE ng/mL (ref <500)
Nordiazepam: NEGATIVE ng/mL (ref <50)
Opiates: NEGATIVE ng/mL (ref <100)
Oxazepam: NEGATIVE ng/mL (ref <50)
Oxidant: NEGATIVE ug/mL (ref <200)
Oxycodone: NEGATIVE ng/mL (ref <100)
Temazepam: NEGATIVE ng/mL (ref <50)
pH: 6.25 (ref 4.5–9.0)

## 2018-08-18 LAB — PROTEIN / CREATININE RATIO, URINE
Creatinine, Urine: 45 mg/dL (ref 20–275)
PROTEIN/CREATININE RATIO: 0.089 mg/mg{creat} (ref 0.021–0.16)
Protein/Creat Ratio: 89 mg/g creat (ref 21–161)
Total Protein, Urine: 4 mg/dL — ABNORMAL LOW (ref 5–24)

## 2018-08-20 ENCOUNTER — Ambulatory Visit
Admission: RE | Admit: 2018-08-20 | Discharge: 2018-08-20 | Disposition: A | Discharge status: Home / Self Care | Payer: Medicare Other | Source: Ambulatory Visit | Attending: Internal Medicine | Admitting: Internal Medicine

## 2018-08-20 DIAGNOSIS — I10 Essential (primary) hypertension: Secondary | ICD-10-CM

## 2018-08-20 DIAGNOSIS — N289 Disorder of kidney and ureter, unspecified: Secondary | ICD-10-CM

## 2018-08-20 DIAGNOSIS — N179 Acute kidney failure, unspecified: Secondary | ICD-10-CM | POA: Diagnosis not present

## 2018-08-30 DIAGNOSIS — H00025 Hordeolum internum left lower eyelid: Secondary | ICD-10-CM | POA: Diagnosis not present

## 2018-08-30 DIAGNOSIS — H25813 Combined forms of age-related cataract, bilateral: Secondary | ICD-10-CM | POA: Diagnosis not present

## 2018-08-30 DIAGNOSIS — H401131 Primary open-angle glaucoma, bilateral, mild stage: Secondary | ICD-10-CM | POA: Diagnosis not present

## 2018-09-11 DIAGNOSIS — H4010X0 Unspecified open-angle glaucoma, stage unspecified: Secondary | ICD-10-CM | POA: Diagnosis not present

## 2018-09-11 DIAGNOSIS — H0100A Unspecified blepharitis right eye, upper and lower eyelids: Secondary | ICD-10-CM | POA: Diagnosis not present

## 2018-09-11 DIAGNOSIS — H0100B Unspecified blepharitis left eye, upper and lower eyelids: Secondary | ICD-10-CM | POA: Diagnosis not present

## 2018-09-11 DIAGNOSIS — H2513 Age-related nuclear cataract, bilateral: Secondary | ICD-10-CM | POA: Diagnosis not present

## 2018-09-28 DIAGNOSIS — H0011 Chalazion right upper eyelid: Secondary | ICD-10-CM | POA: Diagnosis not present

## 2018-10-08 ENCOUNTER — Other Ambulatory Visit: Payer: Self-pay | Admitting: Internal Medicine

## 2018-10-08 NOTE — Telephone Encounter (Signed)
Last Filled :08/15/18  Last Ov:08/15/18

## 2018-10-15 ENCOUNTER — Telehealth: Payer: Self-pay | Admitting: Internal Medicine

## 2018-10-18 NOTE — Telephone Encounter (Signed)
Patient said she did not understand why a refill for her ALPRAZolam Prudy Feeler) 1 MG tablet medication was refused.  She received a prescription 07/27/18 with 120 tablets and she is suppose to take them 4 times a day. That makes that prescription a supply.  So she would be needing a new prescription 10/27/2018.  She said she requested it a little early just so it would be ready by 10/27/2018. Please advise.

## 2018-10-19 ENCOUNTER — Encounter: Payer: Self-pay | Admitting: Internal Medicine

## 2018-10-19 DIAGNOSIS — H0100A Unspecified blepharitis right eye, upper and lower eyelids: Secondary | ICD-10-CM | POA: Diagnosis not present

## 2018-10-19 DIAGNOSIS — H4010X0 Unspecified open-angle glaucoma, stage unspecified: Secondary | ICD-10-CM | POA: Diagnosis not present

## 2018-10-19 DIAGNOSIS — H0011 Chalazion right upper eyelid: Secondary | ICD-10-CM | POA: Diagnosis not present

## 2018-10-19 DIAGNOSIS — H0100B Unspecified blepharitis left eye, upper and lower eyelids: Secondary | ICD-10-CM | POA: Diagnosis not present

## 2018-10-19 NOTE — Telephone Encounter (Signed)
Please advise Dr Fabian Sharp, thanks.  Are you okay with refilling Xanax now? Not due until 10/27/2018

## 2018-10-19 NOTE — Telephone Encounter (Signed)
Pt calling to check status  °

## 2018-10-19 NOTE — Telephone Encounter (Signed)
Ok to refill 1 week early this time?

## 2018-10-22 ENCOUNTER — Other Ambulatory Visit: Payer: Self-pay

## 2018-10-22 MED ORDER — ALPRAZOLAM 1 MG PO TABS: ORAL_TABLET | ORAL | 2 refills | Status: DC

## 2018-10-22 NOTE — Telephone Encounter (Signed)
Pt called to inquire on refill status? Stated she would be out of medication this Thursday or Friday. Pt would like to speak with Dr. Rosezella [place] assistant. Please advise.

## 2018-10-22 NOTE — Telephone Encounter (Signed)
Prescription faxed to pharmacy per dr.Panosh

## 2018-11-07 ENCOUNTER — Other Ambulatory Visit: Payer: Self-pay | Admitting: Internal Medicine

## 2018-11-13 DIAGNOSIS — H25813 Combined forms of age-related cataract, bilateral: Secondary | ICD-10-CM | POA: Diagnosis not present

## 2018-11-13 DIAGNOSIS — H401131 Primary open-angle glaucoma, bilateral, mild stage: Secondary | ICD-10-CM | POA: Diagnosis not present

## 2018-11-21 DIAGNOSIS — M722 Plantar fascial fibromatosis: Secondary | ICD-10-CM | POA: Diagnosis not present

## 2018-11-21 DIAGNOSIS — R52 Pain, unspecified: Secondary | ICD-10-CM | POA: Diagnosis not present

## 2018-11-27 ENCOUNTER — Telehealth: Payer: Self-pay | Admitting: *Deleted

## 2018-11-27 ENCOUNTER — Other Ambulatory Visit: Payer: Self-pay | Admitting: Internal Medicine

## 2018-11-27 NOTE — Telephone Encounter (Signed)
Copied from CRM [id number]. Topic: General - Inquiry >> Nov 27, 2018  2:21 PM Mickel Baas B, NT wrote: Reason for CRM: Patient would like to know if Dr Fabian Sharp would like for her to do the Tox screen at her lab visit on 12/05/2018? Please advise.  CB#: [id number]

## 2018-11-27 NOTE — Telephone Encounter (Signed)
Last filled:10/09/2018 Last OV:08/15/18 Upcoming appt:12/18/2018

## 2018-11-27 NOTE — Telephone Encounter (Signed)
Fioricet refill request  Pt of Dr. Fabian Sharp

## 2018-11-27 NOTE — Telephone Encounter (Signed)
Copied from CRM [id number]. Topic: Quick Communication - Rx Refill/Question >> Nov 27, 2018  2:24 PM Mickel Baas B, NT wrote: Medication: butalbital-acetaminophen-caffeine (FIORICET, ESGIC) 50-325-40 MG tablet  Has the patient contacted their pharmacy? Yes.   (Agent: If no, request that the patient contact the pharmacy for the refill.) (Agent: If yes, when and what did the pharmacy advise?)  Preferred Pharmacy (with phone number or street name): [id number] [place] [place] - [address]  Agent: Please be advised that RX refills may take up to 3 business days. We ask that you follow-up with your pharmacy.

## 2018-11-28 MED ORDER — BUTALBITAL-APAP-CAFFEINE 50-325-40 MG PO TABS: ORAL_TABLET | ORAL | 0 refills | Status: DC

## 2018-11-28 NOTE — Telephone Encounter (Signed)
Please advise pt called again in another message and states she is out

## 2018-11-28 NOTE — Telephone Encounter (Signed)
Refill x1 

## 2018-11-28 NOTE — Telephone Encounter (Signed)
No  tox screen  To be done.

## 2018-11-28 NOTE — Telephone Encounter (Signed)
lvm for pt to call back . Okay for nurse triage to disclose. CRM created

## 2018-11-28 NOTE — Telephone Encounter (Signed)
Left detailed message stating prescription was sent in.

## 2018-11-28 NOTE — Telephone Encounter (Signed)
Pt called stating she requested refill on butalbital-acetaminophen-caffeine (FIORICET, ESGIC) 50-325-40 MG tablet when she called yesterday. This was not noted on original message. Pt is out and has labs 3/11 and OV 3/24 with Dr. Fabian Sharp. Pt asking for call from [place] to let her know.  [id number] [place], [place] - [address] [phone] (Phone) [phone] (Fax)

## 2018-11-28 NOTE — Telephone Encounter (Signed)
Handled in refill request.

## 2018-11-29 ENCOUNTER — Other Ambulatory Visit: Payer: Medicare Other

## 2018-12-04 ENCOUNTER — Ambulatory Visit: Payer: Medicare Other | Admitting: Internal Medicine

## 2018-12-05 ENCOUNTER — Other Ambulatory Visit (INDEPENDENT_AMBULATORY_CARE_PROVIDER_SITE_OTHER): Payer: Medicare Other

## 2018-12-05 ENCOUNTER — Other Ambulatory Visit: Payer: Self-pay

## 2018-12-05 DIAGNOSIS — Z79899 Other long term (current) drug therapy: Secondary | ICD-10-CM | POA: Diagnosis not present

## 2018-12-05 DIAGNOSIS — I10 Essential (primary) hypertension: Secondary | ICD-10-CM | POA: Diagnosis not present

## 2018-12-05 DIAGNOSIS — N289 Disorder of kidney and ureter, unspecified: Secondary | ICD-10-CM

## 2018-12-05 DIAGNOSIS — E785 Hyperlipidemia, unspecified: Secondary | ICD-10-CM | POA: Diagnosis not present

## 2018-12-05 LAB — BASIC METABOLIC PANEL
BUN: 24 mg/dL — ABNORMAL HIGH (ref 6–23)
CHLORIDE: 102 meq/L (ref 96–112)
CO2: 31 mEq/L (ref 19–32)
Calcium: 9.6 mg/dL (ref 8.4–10.5)
Creatinine, Ser: 1.11 mg/dL (ref 0.40–1.20)
GFR: 49.22 mL/min — ABNORMAL LOW (ref >60.00)
Glucose, Bld: 70 mg/dL (ref 70–99)
Potassium: 4.6 mEq/L (ref 3.5–5.1)
Sodium: 139 mEq/L (ref 135–145)

## 2018-12-05 LAB — CBC WITH DIFFERENTIAL/PLATELET
BASOS PCT: 0.5 % (ref 0.0–3.0)
Basophils Absolute: 0 10*3/uL (ref 0.0–0.1)
Eosinophils Absolute: 0 10*3/uL (ref 0.0–0.7)
Eosinophils Relative: 0.6 % (ref 0.0–5.0)
HEMATOCRIT: 39.4 % (ref 36.0–46.0)
Hemoglobin: 13.2 g/dL (ref 12.0–15.0)
Lymphocytes Relative: 26.4 % (ref 12.0–46.0)
Lymphs Abs: 1.7 10*3/uL (ref 0.7–4.0)
MCHC: 33.4 g/dL (ref 30.0–36.0)
MCV: 96.5 fl (ref 78.0–100.0)
Monocytes Absolute: 0.4 10*3/uL (ref 0.1–1.0)
Monocytes Relative: 6.3 % (ref 3.0–12.0)
Neutro Abs: 4.4 10*3/uL (ref 1.4–7.7)
Neutrophils Relative %: 66.2 % (ref 43.0–77.0)
Platelets: 264 10*3/uL (ref 150.0–400.0)
RBC: 4.08 Mil/uL (ref 3.87–5.11)
RDW: 13.6 % (ref 11.5–15.5)
WBC: 6.6 10*3/uL (ref 4.0–10.5)

## 2018-12-05 LAB — LIPID PANEL
CHOLESTEROL: 239 mg/dL — AB (ref 0–200)
HDL: 72.6 mg/dL (ref >39.00)
LDL Cholesterol: 145 mg/dL — ABNORMAL HIGH (ref 0–99)
NonHDL: 166.54
Total CHOL/HDL Ratio: 3
Triglycerides: 107 mg/dL (ref 0.0–149.0)
VLDL: 21.4 mg/dL (ref 0.0–40.0)

## 2018-12-18 ENCOUNTER — Ambulatory Visit: Payer: Medicare Other | Admitting: Internal Medicine

## 2018-12-24 ENCOUNTER — Other Ambulatory Visit: Payer: Self-pay | Admitting: Internal Medicine

## 2018-12-24 NOTE — Telephone Encounter (Signed)
Last filled: 11/28/2018 Last OV:08/15/18 Upcoming appt:01/08/2019

## 2019-01-07 NOTE — Progress Notes (Signed)
Virtual Visit via Video Note  I connected with@ on 01/08/19 at  1:15 PM EDT by a video enabled telemedicine application and verified that I am speaking with the correct person using two identifiers. Location patient: home Location provider:work or home office Persons participating in the virtual visit: patient, provider  [place] national recommendations  regarding COVID 19 pandemic   video visit is advised over in office visit for this patient.  Discussed the limitations of evaluation and management by telemedicine and  availability of in person appointments. The patient expressed understanding and agreed to proceed.   HPI: Brooke Pennington can ask for video visit for follow-up of medications and chronic disease management. Anxiety: Taking medicines about the same appears to be coping fairly well during the COVID-19 getting outside avoiding listening to the news on a regular basis.  Is self quarantined distanced. Blood pressure: Not taking readings but taking medication. Laboratory studies: Had labs done in March was told results but does not remember.    ROS: See pertinent positives and negatives per HPI.  Past Medical History:  Diagnosis Date  . Anal fissure    hx of same  . Dependence, barbiturates ? 05/01/2013   Have come to the conclusion that she is dependent on this medication based on her history and the fact that she feels better when she takes it   . Eczema   . H/O rectal polypectomy    ?  uncertain  if colonic or rectal colonsocopy 8 2007 [facility]  . Headache(784.0)   . Hypertension   . Lymphocytic gastritis    neg SBBX for celiac  in 8 2007   . Medication management failed tox screen 11/27/2013   Neg for xanax and butalbital on urine screen . 2 /15   . Menopause    age 68 with sx no hrt  . PMR (polymyalgia rheumatica) (HCC)    Possible diagnoses by primary care physician with her anemia and elevated sedimentation rate of 60 was on prednisone but stopped it herself  and then felt fine    Past Surgical History:  Procedure Laterality Date  . NO PAST SURGERIES      Family History  Problem Relation Age of Onset  . Coronary artery disease Father   . Heart disease Father   . COPD Mother   . Anemia Sister        needing transfusion some> vit b12 ?    SOCIAL HX:  At home hh of 2  Walking  In [place]    Current Outpatient Medications:  .  ALPRAZolam (XANAX) 1 MG tablet, Take 1 tab 4 times a day as needed, Disp: 120 tablet, Rfl: 2 .  busPIRone (BUSPAR) 10 MG tablet, TAKE 1 AND 1/2 TABLETS BY MOUTH 3 TIMES DAILY, Disp: 405 tablet, Rfl: 1 .  butalbital-acetaminophen-caffeine (FIORICET, ESGIC) 50-325-40 MG tablet, TAKE 1 TO 2 TABLETS BY MOUTH AS NEEDED, Disp: 10 tablet, Rfl: 0 .  latanoprost (XALATAN) 0.005 % ophthalmic solution, Place 1 drop into both eyes at bedtime. , Disp: , Rfl: 3 .  lisinopril (PRINIVIL,ZESTRIL) 10 MG tablet, TAKE 1 TABLET BY MOUTH EVERY DAY, Disp: 90 tablet, Rfl: 1 .  MULTIPLE VITAMIN PO, Take 1 tablet by mouth daily., Disp: , Rfl:  .  tretinoin (RETIN-A) 0.1 % cream, Apply 1 application topically at bedtime. , Disp: , Rfl: 3  EXAM:  VITALS per patient if applicable:  GENERAL: alert, oriented, appears well and in no acute distress  HEENT: atraumatic, conjunttiva  clear, no obvious abnormalities on inspection of external nose and ears  NECK: normal movements of the head and neck  LUNGS: on inspection no signs of respiratory distress, breathing rate appears normal, no obvious gross SOB, gasping or wheezing  CV: no obvious cyanosis  MS: moves all visible extremities without noticeable abnormality  PSYCH/NEURO: pleasant and cooperative, no obvious depression or anxiety, speech and thought processing grossly intact Lab Results  Component Value Date   WBC 6.6 12/05/2018   HGB 13.2 12/05/2018   HCT 39.4 12/05/2018   PLT 264.0 12/05/2018   GLUCOSE 70 12/05/2018   CHOL 239 (H) 12/05/2018   TRIG 107.0 12/05/2018   HDL  72.60 12/05/2018   LDLDIRECT 168.2 12/25/2012   LDLCALC 145 (H) 12/05/2018   ALT 8 05/02/2018   AST 12 05/02/2018   NA 139 12/05/2018   K 4.6 12/05/2018   CL 102 12/05/2018   CREATININE 1.11 12/05/2018   BUN 24 (H) 12/05/2018   CO2 31 12/05/2018   TSH 1.40 05/02/2018    ASSESSMENT AND PLAN:  Discussed the following assessment and plan:  Renal insufficiency - cr now 1.1   Medication management  Essential hypertension  Anxiety state  Mild anemia  Hyperlipidemia, unspecified hyperlipidemia type Laboratory studies reviewed with patient creatinine is down to 1.1 anemia is resolved at this time Lipids could be better but not high risk 10-year ASCVD score.  Continue current medication benefit more than risk stable she still is requesting 10 a month of the butalbital At next request for refill will give 10 with 1 refill to take her through 2 months. Refill alprazolam ( last uds 11 19) August September next visit  cpx etc     Expectant management and discussion of plan and treatment with patient with opportunity to ask questions and all were answered. The patient agreed with the plan and demonstrated an understanding of the instructions.   The patient was advised to call back or seek an in-person evaluation if worsening having concerns   The 10-year ASCVD risk score Denman George DC Montez Hageman., et al., 2013) is: 4.7%   Values used to calculate the score:     Age: 68 years     Sex: Female     Is Non-Hispanic African American: No     Diabetic: No     Tobacco smoker: No     Systolic Blood Pressure: 102 mmHg     Is BP treated: Yes     HDL Cholesterol: 72.6 mg/dL     Total Cholesterol: 239 mg/dL   Berniece Andreas, MD

## 2019-01-08 ENCOUNTER — Ambulatory Visit (INDEPENDENT_AMBULATORY_CARE_PROVIDER_SITE_OTHER): Payer: Medicare Other | Admitting: Internal Medicine

## 2019-01-08 ENCOUNTER — Encounter: Payer: Self-pay | Admitting: Internal Medicine

## 2019-01-08 ENCOUNTER — Other Ambulatory Visit: Payer: Self-pay

## 2019-01-08 DIAGNOSIS — Z79899 Other long term (current) drug therapy: Secondary | ICD-10-CM | POA: Diagnosis not present

## 2019-01-08 DIAGNOSIS — N289 Disorder of kidney and ureter, unspecified: Secondary | ICD-10-CM

## 2019-01-08 DIAGNOSIS — I10 Essential (primary) hypertension: Secondary | ICD-10-CM

## 2019-01-08 DIAGNOSIS — E785 Hyperlipidemia, unspecified: Secondary | ICD-10-CM

## 2019-01-08 DIAGNOSIS — D649 Anemia, unspecified: Secondary | ICD-10-CM

## 2019-01-08 DIAGNOSIS — F411 Generalized anxiety disorder: Secondary | ICD-10-CM | POA: Diagnosis not present

## 2019-01-08 MED ORDER — ALPRAZOLAM 1 MG PO TABS: ORAL_TABLET | ORAL | 2 refills | Status: DC

## 2019-01-21 ENCOUNTER — Other Ambulatory Visit: Payer: Self-pay | Admitting: Internal Medicine

## 2019-01-21 NOTE — Telephone Encounter (Signed)
Pt called - wants to make sure you out  Refill on request   Thanks

## 2019-01-22 NOTE — Telephone Encounter (Signed)
Sent in electronically . With one refill as we  Agreed on last visit

## 2019-01-22 NOTE — Telephone Encounter (Signed)
Please advise 

## 2019-03-18 ENCOUNTER — Other Ambulatory Visit: Payer: Self-pay | Admitting: Internal Medicine

## 2019-03-18 NOTE — Telephone Encounter (Signed)
Please advise 

## 2019-03-19 NOTE — Telephone Encounter (Signed)
Sent in electronically .  

## 2019-03-25 ENCOUNTER — Telehealth: Payer: Self-pay | Admitting: Internal Medicine

## 2019-03-27 ENCOUNTER — Other Ambulatory Visit: Payer: Self-pay | Admitting: Internal Medicine

## 2019-03-27 NOTE — Telephone Encounter (Signed)
Please advise 

## 2019-04-10 DIAGNOSIS — M79605 Pain in left leg: Secondary | ICD-10-CM | POA: Diagnosis not present

## 2019-04-10 DIAGNOSIS — M659 Synovitis and tenosynovitis, unspecified: Secondary | ICD-10-CM | POA: Diagnosis not present

## 2019-04-12 ENCOUNTER — Other Ambulatory Visit: Payer: Self-pay | Admitting: Internal Medicine

## 2019-04-15 NOTE — Telephone Encounter (Signed)
Patient called to say that she is completely out of butalbital-acetaminophen-caffeine (FIORICET) 50-325-40 MG tablet And would like Rx sent to pharmacy today please

## 2019-04-15 NOTE — Telephone Encounter (Signed)
°  Relation to pt: self  Call back number: [phone]   [facility] [place], [place] [address] [phone] (Phone) [phone] (Fax)      Reason for call:  Patient checking on the status of butalbital-acetaminophen-caffeine (FIORICET) 50-325-40 MG tablet request, patient would like a follow up a call today, please advise

## 2019-04-15 NOTE — Telephone Encounter (Signed)
Last ov:01/08/2019 Last filled:03/19/2019

## 2019-04-15 NOTE — Telephone Encounter (Signed)
Refill has been routed for dr.Panosh to approve

## 2019-04-22 DIAGNOSIS — M659 Synovitis and tenosynovitis, unspecified: Secondary | ICD-10-CM | POA: Diagnosis not present

## 2019-04-24 DIAGNOSIS — M659 Synovitis and tenosynovitis, unspecified: Secondary | ICD-10-CM | POA: Diagnosis not present

## 2019-05-10 ENCOUNTER — Other Ambulatory Visit: Payer: Self-pay | Admitting: Internal Medicine

## 2019-05-10 NOTE — Telephone Encounter (Signed)
Last ov:01/08/2019 Last filled:04/15/19 Pleas advise in absence of Dr.Panosh

## 2019-05-13 NOTE — Telephone Encounter (Signed)
Dr. Fry please advise 

## 2019-05-13 NOTE — Telephone Encounter (Signed)
Relation to pt: self  Call back number: [phone] [facility]acy: [facility] [place], [place] [address] [phone] (Phone) [phone] (Fax)      Reason for call:  Patient would like to know if PCP can refill today, please advise

## 2019-06-06 DIAGNOSIS — M79672 Pain in left foot: Secondary | ICD-10-CM | POA: Diagnosis not present

## 2019-06-06 DIAGNOSIS — M659 Synovitis and tenosynovitis, unspecified: Secondary | ICD-10-CM | POA: Diagnosis not present

## 2019-06-11 ENCOUNTER — Other Ambulatory Visit: Payer: Self-pay | Admitting: Internal Medicine

## 2019-06-12 ENCOUNTER — Telehealth: Payer: Self-pay | Admitting: Family Medicine

## 2019-06-12 NOTE — Telephone Encounter (Signed)
Pharmacy sent request forbutalbital-acetaminophen-caffeine (FIORICET) 50-325-40 MG tablet  to Dr. Sarajane Jews but it needs to go to Dr. Regis Bill Pt is out of this medication completely . Derrek Monaco advise    Pt will also need a refill next week for ALPRAZolam Duanne Moron) 1 MG tablet  / please advise

## 2019-06-12 NOTE — Telephone Encounter (Signed)
Last ov:01/08/2019 Last filled:05/14/2019 (fioricet) 03/27/2019(xanax)

## 2019-06-17 MED ORDER — ALPRAZOLAM 1 MG PO TABS: ORAL_TABLET | ORAL | 2 refills | Status: DC

## 2019-06-17 NOTE — Telephone Encounter (Signed)
Sent in refill not sure of the message  Due for    Cpx appt  Please arrange  cpx in October

## 2019-06-17 NOTE — Addendum Note (Signed)
Addended byShanon Ace K on: 06/17/2019 05:04 PM   Modules accepted: Orders

## 2019-06-17 NOTE — Telephone Encounter (Signed)
Pt want to know the status of her Alprazolam because she will be out by the end of the week. Please call pt to advise

## 2019-06-18 NOTE — Telephone Encounter (Signed)
Left voicemail for pt to set up cpe

## 2019-07-15 ENCOUNTER — Other Ambulatory Visit: Payer: Self-pay | Admitting: Internal Medicine

## 2019-07-15 ENCOUNTER — Telehealth: Payer: Self-pay | Admitting: *Deleted

## 2019-07-15 MED ORDER — BUTALBITAL-APAP-CAFFEINE 50-325-40 MG PO TABS: ORAL_TABLET | ORAL | 0 refills | Status: DC

## 2019-07-15 NOTE — Telephone Encounter (Signed)
Medication refill: butalbital-acetaminophen-caffeine (FIORICET) 50-325-40 MG tablet [[id number]]    [facility]acy:  [facility] [place], [place] [address] [phone] (Phone) [phone] (Fax)   Pt states that she completely out. Pt states that she is having headaches today. Please advise

## 2019-07-15 NOTE — Telephone Encounter (Signed)
Last refilled :06/15/19 Has upcoming:08/06/2019

## 2019-07-15 NOTE — Telephone Encounter (Signed)
Pt has already been made an appt

## 2019-07-15 NOTE — Telephone Encounter (Signed)
Please see rx refill request  °

## 2019-07-15 NOTE — Telephone Encounter (Signed)
Copied from [place] [id number]. Topic: Appointment Scheduling - Scheduling Inquiry for Clinic >> Jul 15, 2019 10:50 AM Rayann Heman wrote: Reason for CRM: pt called and stated that she would like a call back to schedule physical. Pt states that she would like to be worked in. Please advise

## 2019-07-15 NOTE — Telephone Encounter (Signed)
Requested medication (s) are due for refill today: yes  Requested medication (s) are on the active medication list: yes  Last refill:  06/12/2019  Future visit scheduled: no  Notes to clinic: Patient is out of medication and having headaches   Requested Prescriptions  Pending Prescriptions Disp Refills   butalbital-acetaminophen-caffeine (FIORICET) 50-325-40 MG tablet 10 tablet 0     Not Delegated - Analgesics:  Non-Opioid Analgesic Combinations Failed - 07/15/2019 10:58 AM      Failed - This refill cannot be delegated      Passed - Valid encounter within last 12 months    Recent Outpatient Visits          6 months ago Renal insufficiency   Therapist, music at [place], MD   11 months ago Renal insufficiency   Therapist, music at [facility], Standley Brooking, MD   1 year ago [facility] [employer] preventive visit   Therapist, music at [facility], Standley Brooking, MD   1 year ago HYPERTENSION, [place] at [facility], Standley Brooking, MD   2 years ago Hyperlipidemia, unspecified hyperlipidemia type   Therapist, music at [facility], Standley Brooking, MD

## 2019-08-02 ENCOUNTER — Telehealth: Payer: Self-pay | Admitting: *Deleted

## 2019-08-02 NOTE — Telephone Encounter (Signed)
Left detailed message letting patient know that lab work will be done directly after appt

## 2019-08-02 NOTE — Telephone Encounter (Signed)
Copied from [place] [id number]. Topic: General - Other >> Aug 02, 2019 12:28 PM Celene Kras A wrote: Reason for CRM: Pt called stating she is wanting to haver her blood work done directly after her appt on 08/06/2019. Please advise.

## 2019-08-02 NOTE — Telephone Encounter (Signed)
Pt called back for [facility]. Please advise.

## 2019-08-02 NOTE — Telephone Encounter (Signed)
Spoke to patient and she had had a questions about what she can take day of labs clarified with pt ofay for her to take her medications

## 2019-08-05 NOTE — Progress Notes (Signed)
Chief Complaint  Patient presents with  . Annual Exam    no concerns     HPI: Brooke Pennington 68 y.o. comes in today for Preventive Medicare exam/ wellness visit and med eval  Overall feeling pretty good   Anxiety stable doing ok  Same med can we refill  Fioricet early ( today )   BP: doesn't check it at home ( anxiety reaction) please confirm  On meds   exercising now  Doing well  On treadmill and no cp sob change in exercise tolerance  Left nipple sore  Off and on for weeks?  After exercising without a bra  Though could be from irritation no dc fever swelling and getting better with a topical breast cream but -please check   To get mammo and gyne check in early [place]  Now       Health Maintenance  Topic Date Due  . PNA vac Low Risk Adult (1 of 2 - PCV13) 03/16/2018  . INFLUENZA VACCINE  12/25/2019 (Originally 04/27/2019)  . MAMMOGRAM  06/28/2020  . TETANUS/TDAP  01/18/2026  . COLONOSCOPY  06/20/2027  . DEXA SCAN  Completed  . Hepatitis C Screening  Completed   Health Maintenance Review LIFESTYLE:  Exercise:   Yes  Walking  Treadmill and  Using fit bit.  60k  Tobacco/ETS: no Alcohol:   ocass 1-2 per week Sugar beverages:   ocass  By drinks   Sleep:  6-7  Drug use: no HH: 2    Hearing:  ok  Vision:  No limitations at present . Last eye check UTD  Safety:  Has smoke detector and wears seat belts.  . No excess sun exposure. Sees dentist regularly.  Falls:  no  Memory: Felt to be good  , no concern from her or her family.  Depression: No anhedonia unusual crying or depressive symptoms    Nutrition: Eats well balanced diet; adequate calcium and vitamin D. No swallowing chewing problems.  Injury: no major injuries in the last six months.  Other healthcare providers:  Reviewed today .  Preventive parameters: up-to-date  Reviewed   ADLS:   There are no problems or need for assistance  driving, feeding, obtaining food, dressing, toileting and  bathing, managing money using phone. She is independent.   ROS:  GEN/ HEENT: No fever, significant weight changes sweats headaches vision problems hearing changes, CV/ PULM; No chest pain shortness of breath cough, syncope,edema  change in exercise tolerance. GI /GU: No adominal pain, vomiting, change in bowel habits. No blood in the stool. No significant GU symptoms. SKIN/HEME: ,no acute skin rashes suspicious lesions or bleeding. No lymphadenopathy, nodules, masses.  NEURO/ PSYCH:  No neurologic signs such as weakness numbness. No depression anxiety. IMM/ Allergy: No unusual infections.  Allergy .   REST of 12 system review negative except as per HPI   Past Medical History:  Diagnosis Date  . Anal fissure    hx of same  . Dependence, barbiturates ? 05/01/2013   Have come to the conclusion that she is dependent on this medication based on her history and the fact that she feels better when she takes it   . Eczema   . H/O rectal polypectomy    ?  uncertain  if colonic or rectal colonsocopy 8 2007 [facility]  . Headache(784.0)   . Hypertension   . Lymphocytic gastritis    neg SBBX for celiac  in 8 2007   .  Medication management failed tox screen 11/27/2013   Neg for xanax and butalbital on urine screen . 2 /15   . Menopause    age 44 with sx no hrt  . PMR (polymyalgia rheumatica) (HCC)    Possible diagnoses by primary care physician with her anemia and elevated sedimentation rate of 60 was on prednisone but stopped it herself and then felt fine    Family History  Problem Relation Age of Onset  . Coronary artery disease Father   . Heart disease Father   . COPD Mother   . Anemia Sister        needing transfusion some> vit b12 ?    Social History   Socioeconomic History  . Marital status: Married    Spouse name: Not on file  . Number of children: Not on file  . Years of education: Not on file  . Highest education level: Not on file  Occupational History  . Not on file   Social Needs  . Financial resource strain: Not on file  . Food insecurity    Worry: Not on file    Inability: Not on file  . Transportation needs    Medical: Not on file    Non-medical: Not on file  Tobacco Use  . Smoking status: Never Smoker  . Smokeless tobacco: Never Used  . Tobacco comment: never used tobacco  Substance and Sexual Activity  . Alcohol use: Yes    Alcohol/week: 1.0 standard drinks    Types: 1 Glasses of wine per week    Comment: ocassionally  . Drug use: No  . Sexual activity: Yes  Lifestyle  . Physical activity    Days per week: Not on file    Minutes per session: Not on file  . Stress: Not on file  Relationships  . Social Herbalist on phone: Not on file    Gets together: Not on file    Attends religious service: Not on file    Active member of club or organization: Not on file    Attends meetings of clubs or organizations: Not on file    Relationship status: Not on file  Other Topics Concern  . 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  Has smoke detector and wears seat belts.  No firearms. No excess sun exposure. Sees dentist regularly . No depression      Only 1 car at this time   No etoh   Father passed dec 23 15 chf heart related age 68       Outpatient Encounter Medications as of 08/06/2019  Medication Sig  . ALPRAZolam (XANAX) 1 MG tablet TAKE 1 TABLET BY MOUTH 4 TIMES A DAY AS NEEDED  . busPIRone (BUSPAR) 10 MG tablet TAKE 1 AND 1/2 TABLETS BY MOUTH 3 TIMES DAILY  . butalbital-acetaminophen-caffeine (FIORICET) 50-325-40 MG tablet TAKE 1 TO 2 TABLETS BY MOUTH AS NEEDED AS DIRECTED  . latanoprost (XALATAN) 0.005 % ophthalmic solution Place 1 drop into both eyes at bedtime.   Marland Kitchen lisinopril (ZESTRIL) 10 MG tablet TAKE 1 TABLET BY MOUTH EVERY DAY  . MULTIPLE VITAMIN PO Take 1 tablet by mouth daily.  Marland Kitchen tretinoin  (RETIN-A) 0.1 % cream Apply 1 application topically at bedtime.   . [DISCONTINUED] butalbital-acetaminophen-caffeine (FIORICET) 50-325-40 MG tablet TAKE 1  TO 2 TABLETS BY MOUTH AS NEEDED AS DIRECTED  . cephALEXin (KEFLEX) 500 MG capsule Take 1 capsule (500 mg total) by mouth 4 (four) times daily for 5 days. If needed for breast infection   No facility-administered encounter medications on file as of 08/06/2019.     EXAM:  BP 132/74 (BP Location: Right Arm, Patient Position: Sitting, Cuff Size: Normal)   Pulse 80   Temp 97.6 F (36.4 C) (Temporal)   Ht '5\' 3"'  (1.6 m)   Wt 154 lb 3.2 oz (69.9 kg)   SpO2 99%   BMI 27.32 kg/m   Body mass index is 27.32 kg/m. Repeat bp130/70 right sitting  Physical Exam: Vital signs reviewed QMG:NOIB is a well-developed well-nourished alert cooperative   who appears stated age in no acute distress.  HEENT: normocephalic atraumatic , Eyes: PERRL EOM's full, conjunctiva clear, ., Ears: no deformity EAC's clear TMs with normal landmarks. Mouth:masked NECK: supple without masses, thyromegaly or bruits. CHEST/PULM:  Clear to auscultation and percussion breath sounds equal no wheeze , rales or rhonchi. No chest wall deformities or tenderness. Breast: left nippe with 2 small 1-2 mm red pin superficial areas mild tender but no dc and no nodules felt    . No dimpling, discharge, masses, tenderness or discharge . CV: PMI is nondisplaced, S1 S2 no gallops, murmurs, rubs. Peripheral pulses are full without delay.No JVD .  ABDOMEN: Bowel sounds normal nontender  No guard or rebound, no hepato splenomegal no CVA tenderness.   Extremtities:  No clubbing cyanosis or edema, no acute joint swelling or redness no focal atrophy NEURO:  Oriented x3, cranial nerves 3-12 appear to be intact, no obvious focal weakness,gait within normal limits no abnormal reflexes or asymmetrical SKIN: No acute rashes normal turgor, color, no bruising or petechiae. PSYCH: Oriented, good eye  contact, no obvious depression anxiety, cognition and judgment appear normal. LN: no cervical axillary inguinal adenopathy No noted deficits in memory, attention, and speech.   Lab Results  Component Value Date   WBC 6.6 12/05/2018   HGB 13.2 12/05/2018   HCT 39.4 12/05/2018   PLT 264.0 12/05/2018   GLUCOSE 70 12/05/2018   CHOL 239 (H) 12/05/2018   TRIG 107.0 12/05/2018   HDL 72.60 12/05/2018   LDLDIRECT 168.2 12/25/2012   LDLCALC 145 (H) 12/05/2018   ALT 8 05/02/2018   AST 12 05/02/2018   NA 139 12/05/2018   K 4.6 12/05/2018   CL 102 12/05/2018   CREATININE 1.11 12/05/2018   BUN 24 (H) 12/05/2018   CO2 31 12/05/2018   TSH 1.40 05/02/2018  fasting today  Update labs since  On current regimen   ASSESSMENT AND PLAN:  Discussed the following assessment and plan:  Visit for preventive health examination  Medication management - continue  limit firoicet to 10 per month and still no anxiety med but doing ok will suspend uds since stable for years dosing and uds etc  - Plan: Basic metabolic panel, CBC with Differential, Hepatic function panel, Lipid panel, TSH  Essential hypertension - controlled  - Plan: Basic metabolic panel, CBC with Differential, Hepatic function panel, Lipid panel, TSH  Renal insufficiency - has been stable  - Plan: Basic metabolic panel, CBC with Differential, Hepatic function panel, Lipid panel, TSH  Mild anemia - better taking cnetrum silver women 50 and over at this time so no iron supp - Plan: Basic metabolic panel, CBC with Differential, Hepatic function panel, Lipid panel, TSH  Anxiety state  Hyperlipidemia, unspecified hyperlipidemia  type - Plan: Basic metabolic panel, CBC with Differential, Hepatic function panel, Lipid panel, TSH  Sore nipple - left after xercise without bra  improving  ? trauma  vs mild infection follow empiric keflex if needed and fu ( has mamo and gyne check early december  no mass   Influenza vaccination declined by patient   Pneumococcal vaccination declined by patient   Local care left nipple   Can add antibiotic if needed and fu with gyne ( or [place] ) if  persistent or progressive now using bra when exercising  Patient Care Team: Blessing Zaucha, Standley Brooking, MD as PCP - General (Internal Medicine) Lorelei Pont, Rudell Cobb, MD as Consulting Physician (Oncology)  Patient Instructions       Preventive Care 65 Years and Older, Female Preventive care refers to lifestyle choices and visits with your health care provider that can promote health and wellness. This includes:  A yearly physical exam. This is also called an annual well check.  Regular dental and eye exams.  Immunizations.  Screening for certain conditions.  Healthy lifestyle choices, such as diet and exercise. What can I expect for my preventive care visit? Physical exam Your health care provider will check:  Height and weight. These may be used to calculate body mass index (BMI), which is a measurement that tells if you are at a healthy weight.  Heart rate and blood pressure.  Your skin for abnormal spots. Counseling Your health care provider may ask you questions about:  Alcohol, tobacco, and drug use.  Emotional well-being.  Home and relationship well-being.  Sexual activity.  Eating habits.  History of falls.  Memory and ability to understand (cognition).  Work and work Statistician.  Pregnancy and menstrual history. What immunizations do I need?  Influenza (flu) vaccine  This is recommended every year. Tetanus, diphtheria, and pertussis (Tdap) vaccine  You may need a Td booster every 10 years. Varicella (chickenpox) vaccine  You may need this vaccine if you have not already been vaccinated. Zoster (shingles) vaccine  You may need this after age 89. Pneumococcal conjugate (PCV13) vaccine  One dose is recommended after age 26. Pneumococcal polysaccharide (PPSV23) vaccine  One dose is recommended after age 57.  Measles, mumps, and rubella (MMR) vaccine  You may need at least one dose of MMR if you were born in 1957 or later. You may also need a second dose. Meningococcal conjugate (MenACWY) vaccine  You may need this if you have certain conditions. Hepatitis A vaccine  You may need this if you have certain conditions or if you travel or work in places where you may be exposed to hepatitis A. Hepatitis B vaccine  You may need this if you have certain conditions or if you travel or work in places where you may be exposed to hepatitis B. Haemophilus influenzae type b (Hib) vaccine  You may need this if you have certain conditions. You may receive vaccines as individual doses or as more than one vaccine together in one shot (combination vaccines). Talk with your health care provider about the risks and benefits of combination vaccines. What tests do I need? Blood tests  Lipid and cholesterol levels. These may be checked every 5 years, or more frequently depending on your overall health.  Hepatitis C test.  Hepatitis B test. Screening  Lung cancer screening. You may have this screening every year starting at age 86 if you have a 30-pack-year history of smoking and currently smoke or have quit within the  past 15 years.  Colorectal cancer screening. All adults should have this screening starting at age 10 and continuing until age 38. Your health care provider may recommend screening at age 23 if you are at increased risk. You will have tests every 1-10 years, depending on your results and the type of screening test.  Diabetes screening. This is done by checking your blood sugar (glucose) after you have not eaten for a while (fasting). You may have this done every 1-3 years.  Mammogram. This may be done every 1-2 years. Talk with your health care provider about how often you should have regular mammograms.  BRCA-related cancer screening. This may be done if you have a family history of breast,  ovarian, tubal, or peritoneal cancers. Other tests  Sexually transmitted disease (STD) testing.  Bone density scan. This is done to screen for osteoporosis. You may have this done starting at age 46. Follow these instructions at home: Eating and drinking  Eat a diet that includes fresh fruits and vegetables, whole grains, lean protein, and low-fat dairy products. Limit your intake of foods with high amounts of sugar, saturated fats, and salt.  Take vitamin and mineral supplements as recommended by your health care provider.  Do not drink alcohol if your health care provider tells you not to drink.  If you drink alcohol: ? Limit how much you have to 0-1 drink a day. ? Be aware of how much alcohol is in your drink. In the [place] one drink equals one 12 oz bottle of beer (355 mL), one 5 oz glass of wine (148 mL), or one 1 oz glass of hard liquor (44 mL). Lifestyle  Take daily care of your teeth and gums.  Stay active. Exercise for at least 30 minutes on 5 or more days each week.  Do not use any products that contain nicotine or tobacco, such as cigarettes, e-cigarettes, and chewing tobacco. If you need help quitting, ask your health care provider.  If you are sexually active, practice safe sex. Use a condom or other form of protection in order to prevent STIs (sexually transmitted infections).  Talk with your health care provider about taking a low-dose aspirin or statin. What's next?  Go to your health care provider once a year for a well check visit.  Ask your health care provider how often you should have your eyes and teeth checked.  Stay up to date on all vaccines. This information is not intended to replace advice given to you by your health care provider. Make sure you discuss any questions you have with your health care provider. Document Released: 10/09/2015 Document Revised: 09/06/2018 Document Reviewed: 09/06/2018 Elsevier Patient Education  2020 [place] you  are doing well.   Continue lifestyle intervention healthy eating and exercise .   Local nipple care   Can add antibiotic  If not getting better or getting worse but seems to be related to local friction  Problem .   Will refill the fioricet today  ( early)  If all is well then ROV in 6 months  For med check    Health Maintenance, Female Adopting a healthy lifestyle and getting preventive care are important in promoting health and wellness. Ask your health care provider about:  The right schedule for you to have regular tests and exams.  Things you can do on your own to prevent diseases and keep yourself healthy. What should I know about diet, weight, and exercise? Eat a healthy diet  Eat a diet that includes plenty of vegetables, fruits, low-fat dairy products, and lean protein.  Do not eat a lot of foods that are high in solid fats, added sugars, or sodium. Maintain a healthy weight Body mass index (BMI) is used to identify weight problems. It estimates body fat based on height and weight. Your health care provider can help determine your BMI and help you achieve or maintain a healthy weight. Get regular exercise Get regular exercise. This is one of the most important things you can do for your health. Most adults should:  Exercise for at least 150 minutes each week. The exercise should increase your heart rate and make you sweat (moderate-intensity exercise).  Do strengthening exercises at least twice a week. This is in addition to the moderate-intensity exercise.  Spend less time sitting. Even light physical activity can be beneficial. Watch cholesterol and blood lipids Have your blood tested for lipids and cholesterol at 66 years of age, then have this test every 5 years. Have your cholesterol levels checked more often if:  Your lipid or cholesterol levels are high.  You are older than 66 years of age.  You are at high risk for heart disease. What should I know about  cancer screening? Depending on your health history and family history, you may need to have cancer screening at various ages. This may include screening for:  Breast cancer.  Cervical cancer.  Colorectal cancer.  Skin cancer.  Lung cancer. What should I know about heart disease, diabetes, and high blood pressure? Blood pressure and heart disease  High blood pressure causes heart disease and increases the risk of stroke. This is more likely to develop in people who have high blood pressure readings, are of African descent, or are overweight.  Have your blood pressure checked: ? Every 3-5 years if you are 80-40 years of age. ? Every year if you are 66 years old or older. Diabetes Have regular diabetes screenings. This checks your fasting blood sugar level. Have the screening done:  Once every three years after age 42 if you are at a normal weight and have a low risk for diabetes.  More often and at a younger age if you are overweight or have a high risk for diabetes. What should I know about preventing infection? Hepatitis B If you have a higher risk for hepatitis B, you should be screened for this virus. Talk with your health care provider to find out if you are at risk for hepatitis B infection. Hepatitis C Testing is recommended for:  Everyone born from [birth year] through 1963.  Anyone with known risk factors for hepatitis C. Sexually transmitted infections (STIs)  Get screened for STIs, including gonorrhea and chlamydia, if: ? You are sexually active and are younger than 66 years of age. ? You are older than 66 years of age and your health care provider tells you that you are at risk for this type of infection. ? Your sexual activity has changed since you were last screened, and you are at increased risk for chlamydia or gonorrhea. Ask your health care provider if you are at risk.  Ask your health care provider about whether you are at high risk for HIV. Your health care  provider may recommend a prescription medicine to help prevent HIV infection. If you choose to take medicine to prevent HIV, you should first get tested for HIV. You should then be tested every 3 months for as long as you are taking the  medicine. Pregnancy  If you are about to stop having your period (premenopausal) and you may become pregnant, seek counseling before you get pregnant.  Take 400 to 800 micrograms (mcg) of folic acid every day if you become pregnant.  Ask for birth control (contraception) if you want to prevent pregnancy. Osteoporosis and menopause Osteoporosis is a disease in which the bones lose minerals and strength with aging. This can result in bone fractures. If you are 29 years old or older, or if you are at risk for osteoporosis and fractures, ask your health care provider if you should:  Be screened for bone loss.  Take a calcium or vitamin D supplement to lower your risk of fractures.  Be given hormone replacement therapy (HRT) to treat symptoms of menopause. Follow these instructions at home: Lifestyle  Do not use any products that contain nicotine or tobacco, such as cigarettes, e-cigarettes, and chewing tobacco. If you need help quitting, ask your health care provider.  Do not use street drugs.  Do not share needles.  Ask your health care provider for help if you need support or information about quitting drugs. Alcohol use  Do not drink alcohol if: ? Your health care provider tells you not to drink. ? You are pregnant, may be pregnant, or are planning to become pregnant.  If you drink alcohol: ? Limit how much you use to 0-1 drink a day. ? Limit intake if you are breastfeeding.  Be aware of how much alcohol is in your drink. In the [place] one drink equals one 12 oz bottle of beer (355 mL), one 5 oz glass of wine (148 mL), or one 1 oz glass of hard liquor (44 mL). General instructions  Schedule regular health, dental, and eye exams.  Stay current  with your vaccines.  Tell your health care provider if: ? You often feel depressed. ? You have ever been abused or do not feel safe at home. Summary  Adopting a healthy lifestyle and getting preventive care are important in promoting health and wellness.  Follow your health care provider's instructions about healthy diet, exercising, and getting tested or screened for diseases.  Follow your health care provider's instructions on monitoring your cholesterol and blood pressure. This information is not intended to replace advice given to you by your health care provider. Make sure you discuss any questions you have with your health care provider. Document Released: 03/28/2011 Document Revised: 09/05/2018 Document Reviewed: 09/05/2018 Elsevier Patient Education  2020 [place] Fogg M.D.

## 2019-08-06 ENCOUNTER — Other Ambulatory Visit: Payer: Self-pay

## 2019-08-06 ENCOUNTER — Encounter: Payer: Self-pay | Admitting: Internal Medicine

## 2019-08-06 ENCOUNTER — Telehealth: Payer: Self-pay | Admitting: *Deleted

## 2019-08-06 ENCOUNTER — Ambulatory Visit (INDEPENDENT_AMBULATORY_CARE_PROVIDER_SITE_OTHER): Payer: Medicare Other | Admitting: Internal Medicine

## 2019-08-06 VITALS — BP 132/74 | HR 80 | Temp 97.6°F | Ht 63.0 in | Wt 154.2 lb

## 2019-08-06 DIAGNOSIS — N289 Disorder of kidney and ureter, unspecified: Secondary | ICD-10-CM | POA: Diagnosis not present

## 2019-08-06 DIAGNOSIS — Z2821 Immunization not carried out because of patient refusal: Secondary | ICD-10-CM

## 2019-08-06 DIAGNOSIS — N644 Mastodynia: Secondary | ICD-10-CM

## 2019-08-06 DIAGNOSIS — D649 Anemia, unspecified: Secondary | ICD-10-CM | POA: Diagnosis not present

## 2019-08-06 DIAGNOSIS — F411 Generalized anxiety disorder: Secondary | ICD-10-CM

## 2019-08-06 DIAGNOSIS — Z79899 Other long term (current) drug therapy: Secondary | ICD-10-CM | POA: Diagnosis not present

## 2019-08-06 DIAGNOSIS — I10 Essential (primary) hypertension: Secondary | ICD-10-CM

## 2019-08-06 DIAGNOSIS — Z0000 Encounter for general adult medical examination without abnormal findings: Secondary | ICD-10-CM

## 2019-08-06 DIAGNOSIS — E785 Hyperlipidemia, unspecified: Secondary | ICD-10-CM | POA: Diagnosis not present

## 2019-08-06 LAB — LIPID PANEL
Cholesterol: 249 mg/dL — ABNORMAL HIGH (ref 0–200)
HDL: 70.1 mg/dL (ref >39.00)
LDL Cholesterol: 154 mg/dL — ABNORMAL HIGH (ref 0–99)
NonHDL: 178.99
Total CHOL/HDL Ratio: 4
Triglycerides: 123 mg/dL (ref 0.0–149.0)
VLDL: 24.6 mg/dL (ref 0.0–40.0)

## 2019-08-06 LAB — HEPATIC FUNCTION PANEL
ALT: 12 U/L (ref 0–35)
AST: 17 U/L (ref 0–37)
Albumin: 4.5 g/dL (ref 3.5–5.2)
Alkaline Phosphatase: 72 U/L (ref 39–117)
Bilirubin, Direct: 0.1 mg/dL (ref 0.0–0.3)
Total Bilirubin: 0.5 mg/dL (ref 0.2–1.2)
Total Protein: 6.7 g/dL (ref 6.0–8.3)

## 2019-08-06 LAB — CBC WITH DIFFERENTIAL/PLATELET
Basophils Absolute: 0 10*3/uL (ref 0.0–0.1)
Basophils Relative: 0.6 % (ref 0.0–3.0)
Eosinophils Absolute: 0.1 10*3/uL (ref 0.0–0.7)
Eosinophils Relative: 1.4 % (ref 0.0–5.0)
HCT: 38.3 % (ref 36.0–46.0)
Hemoglobin: 12.7 g/dL (ref 12.0–15.0)
Lymphocytes Relative: 33.3 % (ref 12.0–46.0)
Lymphs Abs: 1.8 10*3/uL (ref 0.7–4.0)
MCHC: 33.3 g/dL (ref 30.0–36.0)
MCV: 96.9 fl (ref 78.0–100.0)
Monocytes Absolute: 0.5 10*3/uL (ref 0.1–1.0)
Monocytes Relative: 8.5 % (ref 3.0–12.0)
Neutro Abs: 3 10*3/uL (ref 1.4–7.7)
Neutrophils Relative %: 56.2 % (ref 43.0–77.0)
Platelets: 270 10*3/uL (ref 150.0–400.0)
RBC: 3.95 Mil/uL (ref 3.87–5.11)
RDW: 13.3 % (ref 11.5–15.5)
WBC: 5.3 10*3/uL (ref 4.0–10.5)

## 2019-08-06 LAB — BASIC METABOLIC PANEL
BUN: 27 mg/dL — ABNORMAL HIGH (ref 6–23)
CO2: 29 mEq/L (ref 19–32)
Calcium: 9.7 mg/dL (ref 8.4–10.5)
Chloride: 102 mEq/L (ref 96–112)
Creatinine, Ser: 1.13 mg/dL (ref 0.40–1.20)
GFR: 48.12 mL/min — ABNORMAL LOW (ref >60.00)
Glucose, Bld: 91 mg/dL (ref 70–99)
Potassium: 4.9 mEq/L (ref 3.5–5.1)
Sodium: 137 mEq/L (ref 135–145)

## 2019-08-06 LAB — TSH: TSH: 2.47 u[IU]/mL (ref 0.35–4.50)

## 2019-08-06 MED ORDER — CEPHALEXIN 500 MG PO CAPS: 500.0000 mg | ORAL_CAPSULE | Freq: Four times a day (QID) | ORAL | 0 refills | Status: AC

## 2019-08-06 MED ORDER — BUTALBITAL-APAP-CAFFEINE 50-325-40 MG PO TABS: ORAL_TABLET | ORAL | 0 refills | Status: DC

## 2019-08-06 NOTE — Patient Instructions (Addendum)
Preventive Care 80 Years and Older, Female Preventive care refers to lifestyle choices and visits with your health care provider that can promote health and wellness. This includes:  A yearly physical exam. This is also called an annual well check.  Regular dental and eye exams.  Immunizations.  Screening for certain conditions.  Healthy lifestyle choices, such as diet and exercise. What can I expect for my preventive care visit? Physical exam Your health care provider will check:  Height and weight. These may be used to calculate body mass index (BMI), which is a measurement that tells if you are at a healthy weight.  Heart rate and blood pressure.  Your skin for abnormal spots. Counseling Your health care provider may ask you questions about:  Alcohol, tobacco, and drug use.  Emotional well-being.  Home and relationship well-being.  Sexual activity.  Eating habits.  History of falls.  Memory and ability to understand (cognition).  Work and work Statistician.  Pregnancy and menstrual history. What immunizations do I need?  Influenza (flu) vaccine  This is recommended every year. Tetanus, diphtheria, and pertussis (Tdap) vaccine  You may need a Td booster every 10 years. Varicella (chickenpox) vaccine  You may need this vaccine if you have not already been vaccinated. Zoster (shingles) vaccine  You may need this after age 66. Pneumococcal conjugate (PCV13) vaccine  One dose is recommended after age 12. Pneumococcal polysaccharide (PPSV23) vaccine  One dose is recommended after age 16. Measles, mumps, and rubella (MMR) vaccine  You may need at least one dose of MMR if you were born in 1957 or later. You may also need a second dose. Meningococcal conjugate (MenACWY) vaccine  You may need this if you have certain conditions. Hepatitis A vaccine  You may need this if you have certain conditions or if you travel or work in places where you may  be exposed to hepatitis A. Hepatitis B vaccine  You may need this if you have certain conditions or if you travel or work in places where you may be exposed to hepatitis B. Haemophilus influenzae type b (Hib) vaccine  You may need this if you have certain conditions. You may receive vaccines as individual doses or as more than one vaccine together in one shot (combination vaccines). Talk with your health care provider about the risks and benefits of combination vaccines. What tests do I need? Blood tests  Lipid and cholesterol levels. These may be checked every 5 years, or more frequently depending on your overall health.  Hepatitis C test.  Hepatitis B test. Screening  Lung cancer screening. You may have this screening every year starting at age 66 if you have a 30-pack-year history of smoking and currently smoke or have quit within the past 15 years.  Colorectal cancer screening. All adults should have this screening starting at age 66 and continuing until age 103. Your health care provider may recommend screening at age 66 if you are at increased risk. You will have tests every 1-10 years, depending on your results and the type of screening test.  Diabetes screening. This is done by checking your blood sugar (glucose) after you have not eaten for a while (fasting). You may have this done every 1-3 years.  Mammogram. This may be done every 1-2 years. Talk with your health care provider about how often you should have regular mammograms.  BRCA-related cancer screening. This may be done if you have a family history of breast, ovarian,  tubal, or peritoneal cancers. Other tests  Sexually transmitted disease (STD) testing.  Bone density scan. This is done to screen for osteoporosis. You may have this done starting at age 66. Follow these instructions at home: Eating and drinking  Eat a diet that includes fresh fruits and vegetables, whole grains, lean protein, and low-fat dairy  products. Limit your intake of foods with high amounts of sugar, saturated fats, and salt.  Take vitamin and mineral supplements as recommended by your health care provider.  Do not drink alcohol if your health care provider tells you not to drink.  If you drink alcohol: ? Limit how much you have to 0-1 drink a day. ? Be aware of how much alcohol is in your drink. In the [place] one drink equals one 12 oz bottle of beer (355 mL), one 5 oz glass of wine (148 mL), or one 1 oz glass of hard liquor (44 mL). Lifestyle  Take daily care of your teeth and gums.  Stay active. Exercise for at least 30 minutes on 5 or more days each week.  Do not use any products that contain nicotine or tobacco, such as cigarettes, e-cigarettes, and chewing tobacco. If you need help quitting, ask your health care provider.  If you are sexually active, practice safe sex. Use a condom or other form of protection in order to prevent STIs (sexually transmitted infections).  Talk with your health care provider about taking a low-dose aspirin or statin. What's next?  Go to your health care provider once a year for a well check visit.  Ask your health care provider how often you should have your eyes and teeth checked.  Stay up to date on all vaccines. This information is not intended to replace advice given to you by your health care provider. Make sure you discuss any questions you have with your health care provider. Document Released: 10/09/2015 Document Revised: 09/06/2018 Document Reviewed: 09/06/2018 Elsevier Patient Education  2020 [place] you are doing well.   Continue lifestyle intervention healthy eating and exercise .   Local nipple care   Can add antibiotic  If not getting better or getting worse but seems to be related to local friction  Problem .   Will refill the fioricet today  ( early)  If all is well then ROV in 6 months  For med check    Health Maintenance, Female Adopting a  healthy lifestyle and getting preventive care are important in promoting health and wellness. Ask your health care provider about:  The right schedule for you to have regular tests and exams.  Things you can do on your own to prevent diseases and keep yourself healthy. What should I know about diet, weight, and exercise? Eat a healthy diet   Eat a diet that includes plenty of vegetables, fruits, low-fat dairy products, and lean protein.  Do not eat a lot of foods that are high in solid fats, added sugars, or sodium. Maintain a healthy weight Body mass index (BMI) is used to identify weight problems. It estimates body fat based on height and weight. Your health care provider can help determine your BMI and help you achieve or maintain a healthy weight. Get regular exercise Get regular exercise. This is one of the most important things you can do for your health. Most adults should:  Exercise for at least 150 minutes each week. The exercise should increase your heart rate and make you sweat (moderate-intensity exercise).  Do strengthening exercises  at least twice a week. This is in addition to the moderate-intensity exercise.  Spend less time sitting. Even light physical activity can be beneficial. Watch cholesterol and blood lipids Have your blood tested for lipids and cholesterol at 66 years of age, then have this test every 5 years. Have your cholesterol levels checked more often if:  Your lipid or cholesterol levels are high.  You are older than 66 years of age.  You are at high risk for heart disease. What should I know about cancer screening? Depending on your health history and family history, you may need to have cancer screening at various ages. This may include screening for:  Breast cancer.  Cervical cancer.  Colorectal cancer.  Skin cancer.  Lung cancer. What should I know about heart disease, diabetes, and high blood pressure? Blood pressure and heart disease   High blood pressure causes heart disease and increases the risk of stroke. This is more likely to develop in people who have high blood pressure readings, are of African descent, or are overweight.  Have your blood pressure checked: ? Every 3-5 years if you are 25-3 years of age. ? Every year if you are 66 years old or older. Diabetes Have regular diabetes screenings. This checks your fasting blood sugar level. Have the screening done:  Once every three years after age 38 if you are at a normal weight and have a low risk for diabetes.  More often and at a younger age if you are overweight or have a high risk for diabetes. What should I know about preventing infection? Hepatitis B If you have a higher risk for hepatitis B, you should be screened for this virus. Talk with your health care provider to find out if you are at risk for hepatitis B infection. Hepatitis C Testing is recommended for:  Everyone born from [birth year] through 1963.  Anyone with known risk factors for hepatitis C. Sexually transmitted infections (STIs)  Get screened for STIs, including gonorrhea and chlamydia, if: ? You are sexually active and are younger than 66 years of age. ? You are older than 66 years of age and your health care provider tells you that you are at risk for this type of infection. ? Your sexual activity has changed since you were last screened, and you are at increased risk for chlamydia or gonorrhea. Ask your health care provider if you are at risk.  Ask your health care provider about whether you are at high risk for HIV. Your health care provider may recommend a prescription medicine to help prevent HIV infection. If you choose to take medicine to prevent HIV, you should first get tested for HIV. You should then be tested every 3 months for as long as you are taking the medicine. Pregnancy  If you are about to stop having your period (premenopausal) and you may become pregnant, seek counseling  before you get pregnant.  Take 400 to 800 micrograms (mcg) of folic acid every day if you become pregnant.  Ask for birth control (contraception) if you want to prevent pregnancy. Osteoporosis and menopause Osteoporosis is a disease in which the bones lose minerals and strength with aging. This can result in bone fractures. If you are 53 years old or older, or if you are at risk for osteoporosis and fractures, ask your health care provider if you should:  Be screened for bone loss.  Take a calcium or vitamin D supplement to lower your risk of fractures.  Be  given hormone replacement therapy (HRT) to treat symptoms of menopause. Follow these instructions at home: Lifestyle  Do not use any products that contain nicotine or tobacco, such as cigarettes, e-cigarettes, and chewing tobacco. If you need help quitting, ask your health care provider.  Do not use street drugs.  Do not share needles.  Ask your health care provider for help if you need support or information about quitting drugs. Alcohol use  Do not drink alcohol if: ? Your health care provider tells you not to drink. ? You are pregnant, may be pregnant, or are planning to become pregnant.  If you drink alcohol: ? Limit how much you use to 0-1 drink a day. ? Limit intake if you are breastfeeding.  Be aware of how much alcohol is in your drink. In the [place] one drink equals one 12 oz bottle of beer (355 mL), one 5 oz glass of wine (148 mL), or one 1 oz glass of hard liquor (44 mL). General instructions  Schedule regular health, dental, and eye exams.  Stay current with your vaccines.  Tell your health care provider if: ? You often feel depressed. ? You have ever been abused or do not feel safe at home. Summary  Adopting a healthy lifestyle and getting preventive care are important in promoting health and wellness.  Follow your health care provider's instructions about healthy diet, exercising, and getting tested or  screened for diseases.  Follow your health care provider's instructions on monitoring your cholesterol and blood pressure. This information is not intended to replace advice given to you by your health care provider. Make sure you discuss any questions you have with your health care provider. Document Released: 03/28/2011 Document Revised: 09/05/2018 Document Reviewed: 09/05/2018 Elsevier Patient Education  2020 [employer].

## 2019-08-06 NOTE — Telephone Encounter (Signed)
Called pt and told her that I spoke with pharmacy and they will fill her medicine for her today

## 2019-08-06 NOTE — Telephone Encounter (Signed)
Copied from [place] [id number]. Topic: General - Other >> Aug 06, 2019 10:49 AM Rainey Pines A wrote: Patient would like a callback from [facility] in regards to medication that pharmacy will not fill medication today but tomorrow. (butalbital-acetaminophen-caffeine (FIORICET) 50-325-40 MG tablet) Please advise.

## 2019-08-07 ENCOUNTER — Telehealth: Payer: Self-pay

## 2019-08-07 NOTE — Telephone Encounter (Signed)
Pt. Called to discuss recent lab results.  Advised of result notes per Dr. Regis Bill from 08/06/19.  Pt. stated she has ramped up her exercise and diet.  Verb. Understanding of results and recommendations.  (documented in TE due to result note no longer open for charting)

## 2019-08-28 ENCOUNTER — Other Ambulatory Visit: Payer: Self-pay | Admitting: Internal Medicine

## 2019-08-28 NOTE — Telephone Encounter (Signed)
Last ov:08/06/2019 Last filled : 06/17/2019 xanax  08/06/2019 fioricet

## 2019-08-29 DIAGNOSIS — Z1231 Encounter for screening mammogram for malignant neoplasm of breast: Secondary | ICD-10-CM | POA: Diagnosis not present

## 2019-08-29 LAB — HM MAMMOGRAPHY

## 2019-09-03 LAB — HM MAMMOGRAPHY

## 2019-09-10 ENCOUNTER — Encounter: Payer: Self-pay | Admitting: Internal Medicine

## 2019-09-12 ENCOUNTER — Other Ambulatory Visit: Payer: Self-pay | Admitting: Internal Medicine

## 2019-09-12 NOTE — Telephone Encounter (Signed)
Last ov:08/06/2019 Last filled:08/28/2019 

## 2019-09-19 ENCOUNTER — Encounter: Payer: Self-pay | Admitting: Internal Medicine

## 2019-10-14 ENCOUNTER — Other Ambulatory Visit: Payer: Self-pay | Admitting: Family Medicine

## 2019-10-15 NOTE — Telephone Encounter (Signed)
Last ov:08/06/2019 Last filled:09/13/2019

## 2019-10-16 NOTE — Telephone Encounter (Signed)
I sent in refill but last rx was early

## 2019-10-21 DIAGNOSIS — D1801 Hemangioma of skin and subcutaneous tissue: Secondary | ICD-10-CM | POA: Diagnosis not present

## 2019-10-21 DIAGNOSIS — L650 Telogen effluvium: Secondary | ICD-10-CM | POA: Diagnosis not present

## 2019-10-21 DIAGNOSIS — L821 Other seborrheic keratosis: Secondary | ICD-10-CM | POA: Diagnosis not present

## 2019-10-21 DIAGNOSIS — D225 Melanocytic nevi of trunk: Secondary | ICD-10-CM | POA: Diagnosis not present

## 2019-10-22 DIAGNOSIS — L650 Telogen effluvium: Secondary | ICD-10-CM | POA: Diagnosis not present

## 2019-11-14 ENCOUNTER — Other Ambulatory Visit: Payer: Self-pay | Admitting: Internal Medicine

## 2019-11-18 ENCOUNTER — Other Ambulatory Visit: Payer: Self-pay | Admitting: Internal Medicine

## 2019-11-18 NOTE — Telephone Encounter (Signed)
Please see note below. 

## 2019-11-18 NOTE — Telephone Encounter (Signed)
Last ov:08/06/2019 Last filled:10/16/19 Upcoming:02/04/20

## 2019-11-18 NOTE — Telephone Encounter (Signed)
Can Dr. Fabian Sharp do 20 pills to last her for 2 months.   Please advise  Patient needs this refilled today please.  She would also like for the nurse to give her call back and needs to ask questions about the COVID vaccination.

## 2019-11-27 ENCOUNTER — Other Ambulatory Visit: Payer: Self-pay | Admitting: Internal Medicine

## 2019-11-27 NOTE — Telephone Encounter (Signed)
Last ov:08/06/2019 Last filled:08/28/2019

## 2019-12-09 DIAGNOSIS — D2239 Melanocytic nevi of other parts of face: Secondary | ICD-10-CM | POA: Diagnosis not present

## 2019-12-09 DIAGNOSIS — L821 Other seborrheic keratosis: Secondary | ICD-10-CM | POA: Diagnosis not present

## 2019-12-09 DIAGNOSIS — L648 Other androgenic alopecia: Secondary | ICD-10-CM | POA: Diagnosis not present

## 2019-12-09 DIAGNOSIS — L650 Telogen effluvium: Secondary | ICD-10-CM | POA: Diagnosis not present

## 2019-12-11 DIAGNOSIS — H401131 Primary open-angle glaucoma, bilateral, mild stage: Secondary | ICD-10-CM | POA: Diagnosis not present

## 2019-12-11 DIAGNOSIS — H25813 Combined forms of age-related cataract, bilateral: Secondary | ICD-10-CM | POA: Diagnosis not present

## 2019-12-18 ENCOUNTER — Telehealth: Payer: Self-pay | Admitting: Internal Medicine

## 2019-12-18 NOTE — Telephone Encounter (Signed)
Medication:Butalbital  Pharmacy:[facility] [place]    Pt is requesting medication, she understands that she filled in Feb and it was suppose to last her two months but she has had to take one every day. She is requesting 10 more to last her she has been going through some stressful times. Pt would like a call back with decision.   Patient Phone: [phone]

## 2019-12-20 ENCOUNTER — Telehealth: Payer: Self-pay | Admitting: Internal Medicine

## 2019-12-20 NOTE — Telephone Encounter (Signed)
Left message to return phone call. Pt will need to wait until Dr. Fabian Sharp return for an early refill.

## 2019-12-20 NOTE — Telephone Encounter (Signed)
Pt returning your call

## 2019-12-20 NOTE — Telephone Encounter (Signed)
Pt returned call and I advised of update. PT was advised that Dr.Panosh will be in next week and she can decide about prescribing more medication. Pt stated that she would like 10 more tablets to help get her through her issues. PT verbalized understating and stated she can wait until Dr. Fabian Sharp returns.

## 2019-12-23 MED ORDER — BUTALBITAL-APAP-CAFFEINE 50-325-40 MG PO TABS: ORAL_TABLET | ORAL | 0 refills | Status: DC

## 2019-12-23 NOTE — Telephone Encounter (Signed)
Please advise 

## 2019-12-23 NOTE — Telephone Encounter (Signed)
I will refill 10 this time but needs visit or fu if ongoing issues .

## 2019-12-24 NOTE — Telephone Encounter (Signed)
Closing note pt will need a visit if needing more then 10 days.

## 2019-12-24 NOTE — Telephone Encounter (Signed)
Left message to return phone call.

## 2019-12-24 NOTE — Telephone Encounter (Signed)
See message answered  Sent in 10 early but  Fu if ongoing issue

## 2019-12-24 NOTE — Telephone Encounter (Signed)
Noted! Lm for pt to return call so that I can notify of Dr. Fabian Sharp advise.

## 2020-01-06 ENCOUNTER — Telehealth: Payer: Self-pay | Admitting: Internal Medicine

## 2020-01-06 NOTE — Telephone Encounter (Signed)
error 

## 2020-01-10 ENCOUNTER — Telehealth: Payer: Self-pay | Admitting: Internal Medicine

## 2020-01-10 NOTE — Telephone Encounter (Signed)
Cannot find any mention of pt taking coumadin or have any reason for it to be prescribed. Looks like Brooke Pennington scheduled it. Unsure but this could have been a mistake and maybe the pt was to be scheduled for some other apt. Will leave for Arline Asp to look at on Monday.

## 2020-01-10 NOTE — Telephone Encounter (Signed)
Pt received a text message notification stating she has an appt on 4/21 for coumadin clinic and she is not sure why she has that appt? She would like clarity on what this appt is for and why she needs it?   She also wants to make sure that she is ok to get the COVID vaccine due to the  mediations she is on?   Pt can be reached at [phone] -ok to leave a detailed message per pt

## 2020-01-10 NOTE — Telephone Encounter (Signed)
Please advise patient for Coumadin Clinc.  Dr. Fabian Sharp, please advise if patient is OK to receive Covid injections per her question.

## 2020-01-10 NOTE — Telephone Encounter (Signed)
Called patient and she stated that she has never taken any Coumadin and there in no need for her to be scheduled for this. This looks like it is a mistake and I have canceled this appointment that was scheduled on 01/15/20 at 3:15pm for the Coumadin clinic. Patient was worried but she was relieved that this was a mistake. Patient verbalized an understanding.

## 2020-01-10 NOTE — Telephone Encounter (Signed)
Not aware of  Her being on coumadin or anticoagulant  May need someone to contact patient   ? If  was a lab appt?

## 2020-01-14 ENCOUNTER — Other Ambulatory Visit: Payer: Self-pay | Admitting: Internal Medicine

## 2020-01-15 ENCOUNTER — Ambulatory Visit: Payer: Medicare Other

## 2020-01-21 ENCOUNTER — Other Ambulatory Visit: Payer: Self-pay | Admitting: Internal Medicine

## 2020-01-21 NOTE — Telephone Encounter (Signed)
Last OV 08/06/2019  Last filled 12/23/2019, # 10 with 0 refills

## 2020-02-04 ENCOUNTER — Telehealth: Payer: Medicare Other | Admitting: Internal Medicine

## 2020-02-11 ENCOUNTER — Other Ambulatory Visit: Payer: Self-pay | Admitting: Internal Medicine

## 2020-02-11 NOTE — Telephone Encounter (Signed)
Last OV 08/06/2019  Xanax last filled 11/27/2019, # 120 with 2 refills  Fiorcet last filled 01/22/2020, # 10 with 0 refills

## 2020-02-19 DIAGNOSIS — L814 Other melanin hyperpigmentation: Secondary | ICD-10-CM | POA: Diagnosis not present

## 2020-02-19 DIAGNOSIS — L700 Acne vulgaris: Secondary | ICD-10-CM | POA: Diagnosis not present

## 2020-02-19 DIAGNOSIS — L2089 Other atopic dermatitis: Secondary | ICD-10-CM | POA: Diagnosis not present

## 2020-02-19 DIAGNOSIS — X32XXXS Exposure to sunlight, sequela: Secondary | ICD-10-CM | POA: Diagnosis not present

## 2020-03-03 ENCOUNTER — Telehealth (INDEPENDENT_AMBULATORY_CARE_PROVIDER_SITE_OTHER): Payer: Medicare Other | Admitting: Internal Medicine

## 2020-03-03 ENCOUNTER — Encounter: Payer: Self-pay | Admitting: Internal Medicine

## 2020-03-03 ENCOUNTER — Other Ambulatory Visit: Payer: Self-pay

## 2020-03-03 ENCOUNTER — Telehealth: Payer: Self-pay | Admitting: Internal Medicine

## 2020-03-03 VITALS — Ht 63.0 in | Wt 130.0 lb

## 2020-03-03 DIAGNOSIS — I10 Essential (primary) hypertension: Secondary | ICD-10-CM

## 2020-03-03 DIAGNOSIS — Z79899 Other long term (current) drug therapy: Secondary | ICD-10-CM | POA: Diagnosis not present

## 2020-03-03 DIAGNOSIS — F411 Generalized anxiety disorder: Secondary | ICD-10-CM

## 2020-03-03 MED ORDER — BUTALBITAL-APAP-CAFFEINE 50-325-40 MG PO TABS: ORAL_TABLET | ORAL | 0 refills | Status: DC

## 2020-03-03 NOTE — Telephone Encounter (Signed)
[facility] frequency for the medication butalbital-acetaminophen or how long the PCP wants this to last?   [facility]  Phone: [phone] FAX: [phone]

## 2020-03-03 NOTE — Progress Notes (Signed)
   Virtual Visit via Telephone Note  I connected with@ on 03/03/20 at  2:00 PM EDT by telephone and verified that I am speaking with the correct person using two identifiers.   I discussed the limitations, risks, security and privacy concerns of performing an evaluation and management service by telephone and the limited availability of in person appointments. tThere may be a patient responsible charge related to this service. The patient expressed understanding and agreed to proceed.  Location patient: home Location provider: work office Participants present for the call: patient, provider Patient did not have a visit in the prior 7 days to address this/these issue(s).   History of Present Illness: Brooke Pennington   had pfizer  x2  Vaccine  BP taking med no se  ANxiety samle alprazolam  And buspar.  Asks for refill early on the  fiorticet  Stress and had ha  Doesn't take daily  Last refill was  May 21 ( 2.5 weeks ago )  Had a few days of severe fatigue arm pain etc after second pfizer but did fine and went away Observations/Objective: Patient sounds personable and well on the phone. I do not appreciate any SOB. Speech and thought processing are grossly intact. Patient reported vitals: Lab Results  Component Value Date   WBC 5.3 08/06/2019   HGB 12.7 08/06/2019   HCT 38.3 08/06/2019   PLT 270.0 08/06/2019   GLUCOSE 91 08/06/2019   CHOL 249 (H) 08/06/2019   TRIG 123.0 08/06/2019   HDL 70.10 08/06/2019   LDLDIRECT 168.2 12/25/2012   LDLCALC 154 (H) 08/06/2019   ALT 12 08/06/2019   AST 17 08/06/2019   NA 137 08/06/2019   K 4.9 08/06/2019   CL 102 08/06/2019   CREATININE 1.13 08/06/2019   BUN 27 (H) 08/06/2019   CO2 29 08/06/2019   TSH 2.47 08/06/2019    Assessment and Plan:  Anxiety state  Medication management  Long term prescription benzodiazepine use  Essential hypertension  Has had covid vaccine and se  Now resolved   Follow Up Instructions:  asks to fill   early again stress and  Some headaches    resolved to not asks early next month   Seems stable for her situation. Will refill ear;uy today .  Plan cpx and lab in November  Not doing uds at current situation.  Call ahead for orders to do lab ahead of time as  Possible  [id number] 5-10 [id number] 11-20 [id number] 21-30 I did not refer this patient for an OV in the next 24 hours for this/these issue(s).  I discussed the assessment and treatment plan with the patient. The patient was provided an opportunity to ask questions and answered. The patient agreed with the plan and demonstrated an understanding of the instructions.   The patient was advised to call back or seek an in-person evaluation if the symptoms worsen or if the condition fails to improve as anticipated.  I provided 18 minutes of non-face-to-face time during this encounter. Return for cpx and lab   in November  and med check .  Berniece Andreas, MD

## 2020-03-03 NOTE — Telephone Encounter (Signed)
Please see message. I think they need to know if this is for 30 or 90 days. Please advise.

## 2020-03-04 ENCOUNTER — Telehealth: Payer: Self-pay | Admitting: Internal Medicine

## 2020-03-04 NOTE — Telephone Encounter (Signed)
[facility] and [facility] and gave her the message from Dr. Fabian Sharp. Angie verbalized an understanding.

## 2020-03-04 NOTE — Telephone Encounter (Signed)
[facility] because they need a call back.  They need more details on dosing instructions.  Patient is requesting a call back and try to [facility] so she can pick it up before Noon.   Medication- Butalbital

## 2020-03-04 NOTE — Telephone Encounter (Signed)
thiis is a 30 day rx but ok to fill early  This time

## 2020-03-04 NOTE — Telephone Encounter (Signed)
Called patient and let her know that I have already called the pharmacy and gave them the information that they needed. Patient verbalized an understanding.

## 2020-03-26 ENCOUNTER — Other Ambulatory Visit: Payer: Self-pay | Admitting: Internal Medicine

## 2020-04-22 ENCOUNTER — Other Ambulatory Visit: Payer: Self-pay | Admitting: Internal Medicine

## 2020-04-22 NOTE — Telephone Encounter (Signed)
Last OV 03/03/2020 (Virtual Visit)  Last filled 03/27/2020, # 10 with 0 refills

## 2020-04-23 ENCOUNTER — Telehealth: Payer: Self-pay | Admitting: Internal Medicine

## 2020-04-23 NOTE — Telephone Encounter (Signed)
Pt called and said the pharmacy is waiting for her medication order of butalbital to be filled. Also wanting a call when Dr. Fabian Sharp signs off on it.  Pt. Number [phone]  Please advise

## 2020-04-24 NOTE — Telephone Encounter (Signed)
Please let me know when you approve this so I can call the patient and let her know per her request. Thank you!

## 2020-04-24 NOTE — Telephone Encounter (Signed)
Called patient and LMOVM to return call  Left a detailed voice message to let patient know that her medication has been sent to the pharmacy.

## 2020-04-24 NOTE — Telephone Encounter (Signed)
Just sent it

## 2020-04-27 ENCOUNTER — Telehealth: Payer: Self-pay | Admitting: Internal Medicine

## 2020-04-27 NOTE — Telephone Encounter (Signed)
Left message for patient to schedule Annual Wellness Visit.  Please schedule with Nurse Health Advisor Shannon Crews, RN at [facility]  

## 2020-04-28 ENCOUNTER — Other Ambulatory Visit: Payer: Self-pay | Admitting: Internal Medicine

## 2020-05-11 ENCOUNTER — Other Ambulatory Visit: Payer: Self-pay | Admitting: Internal Medicine

## 2020-05-11 NOTE — Telephone Encounter (Signed)
Last OV 03/03/2020  Last filled 02/14/2020, # 120 with 2 refills

## 2020-05-18 ENCOUNTER — Other Ambulatory Visit: Payer: Self-pay | Admitting: Internal Medicine

## 2020-05-18 NOTE — Telephone Encounter (Signed)
Last OV 03/03/2020 (Virtual Visit)  Last filled 04/24/2020, # 10 with 0 refills

## 2020-05-26 IMAGING — US US RENAL
1 series · 14 of 25 positions shown · non-contrast
Comparison: None

CLINICAL DATA: Renal insufficiency, hypertension

EXAM:
RENAL / URINARY TRACT ULTRASOUND COMPLETE

[Series 1: us renal · 0.19mm/px · 14 of 36 slices shown]
[im 1/36]
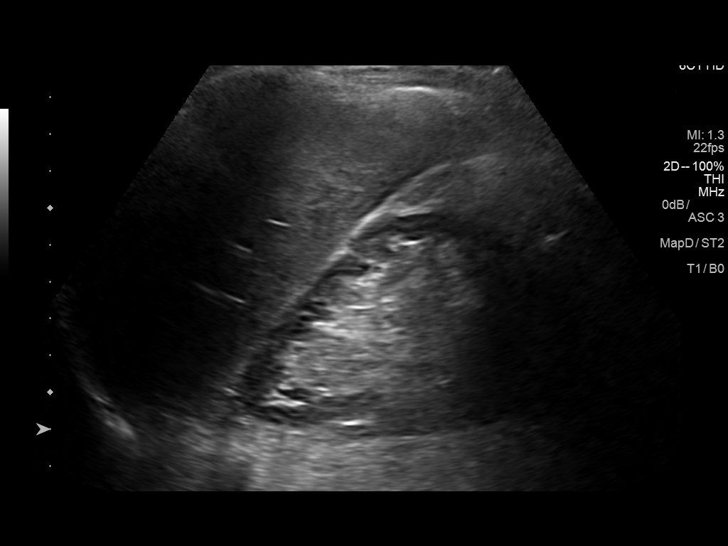
[im 3/36]
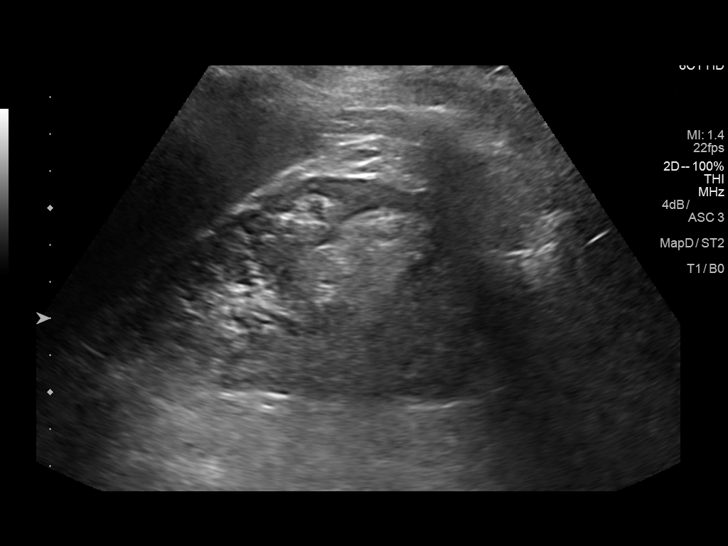
[im 6/36]
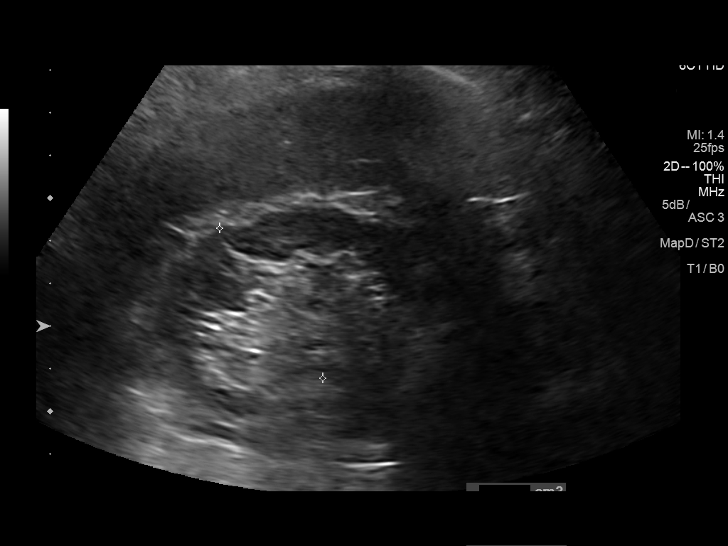
[im 9/36]
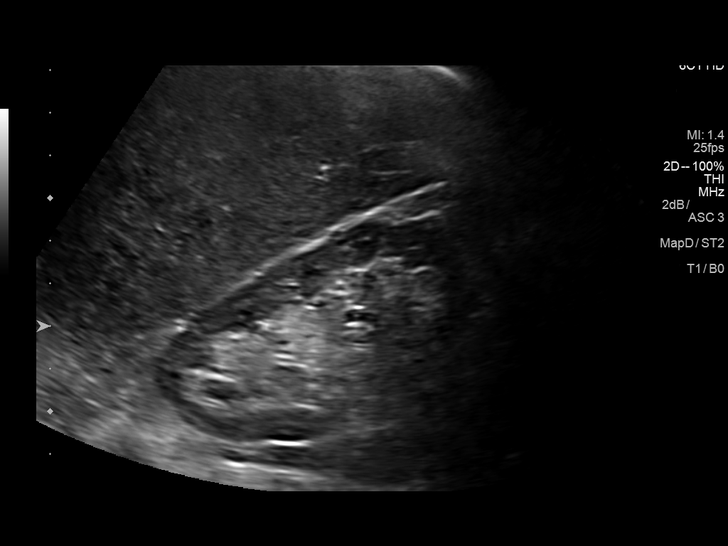
[im 12/36]
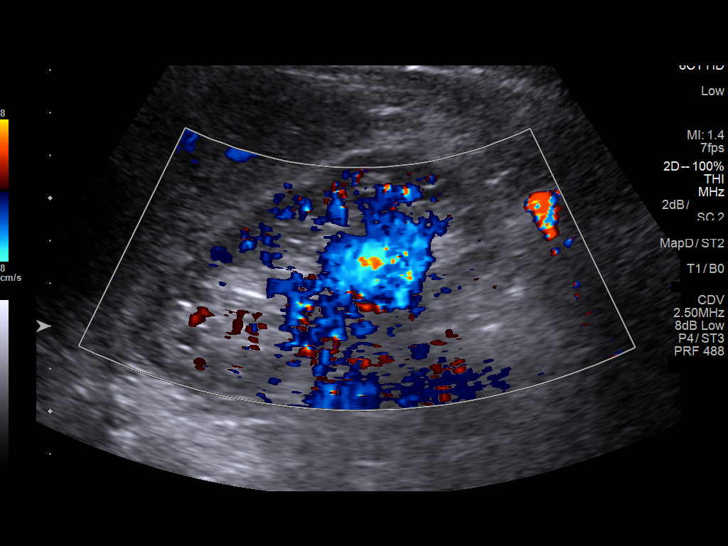
[im 14/36]
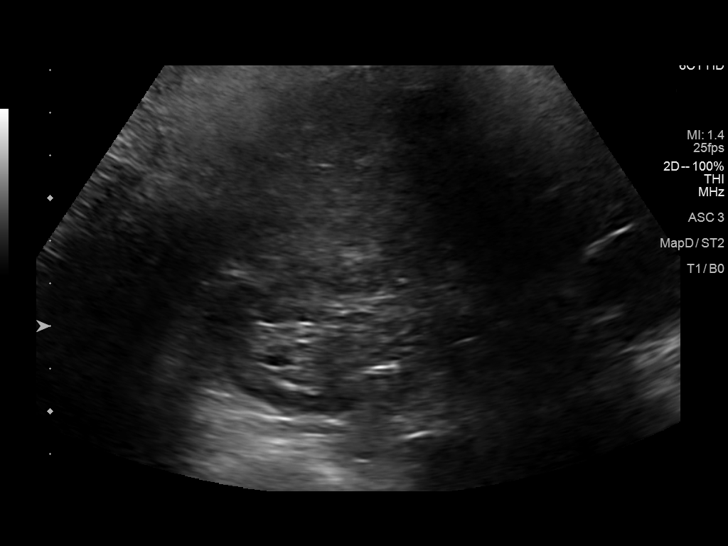
[im 17/36]
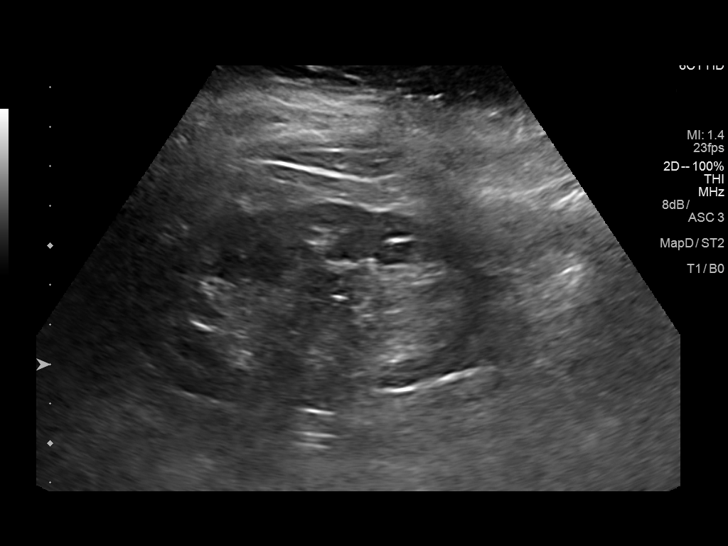
[im 19/36]
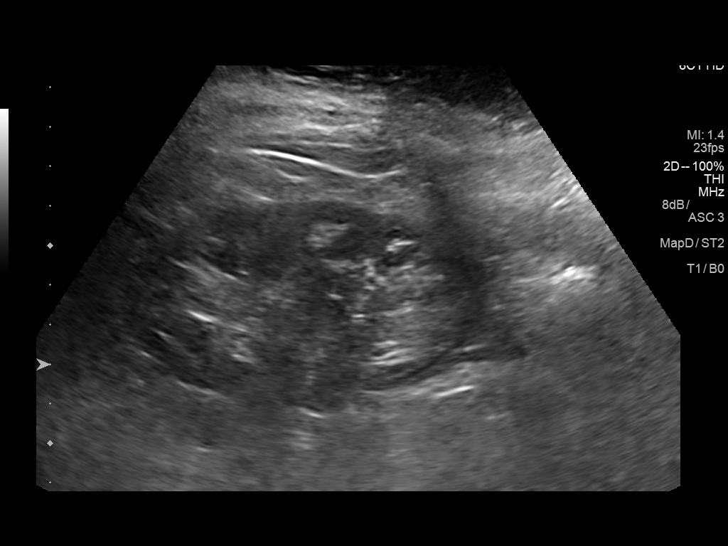
[im 22/36]
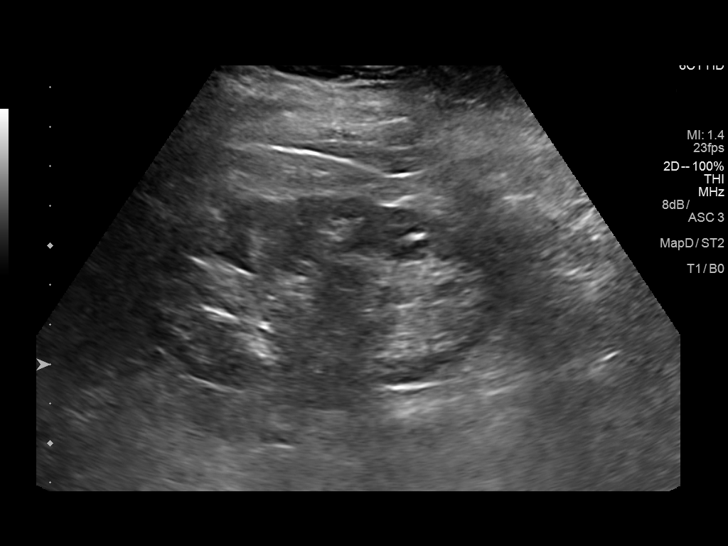
[im 24/36]
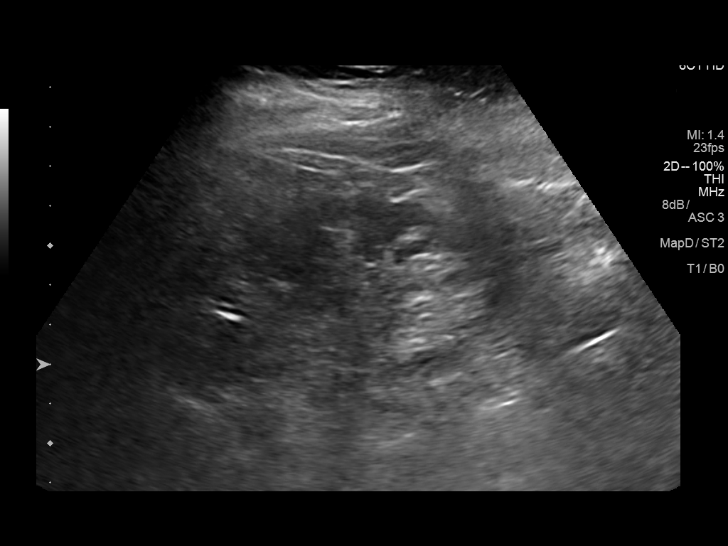
[im 27/36]
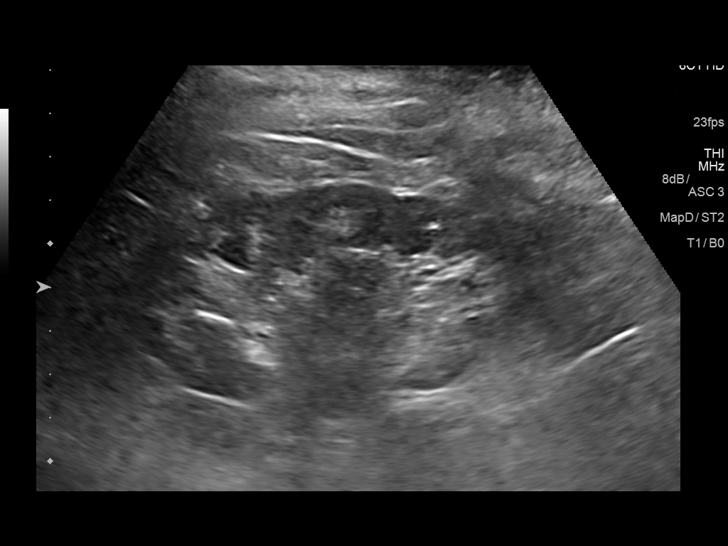
[im 30/36]
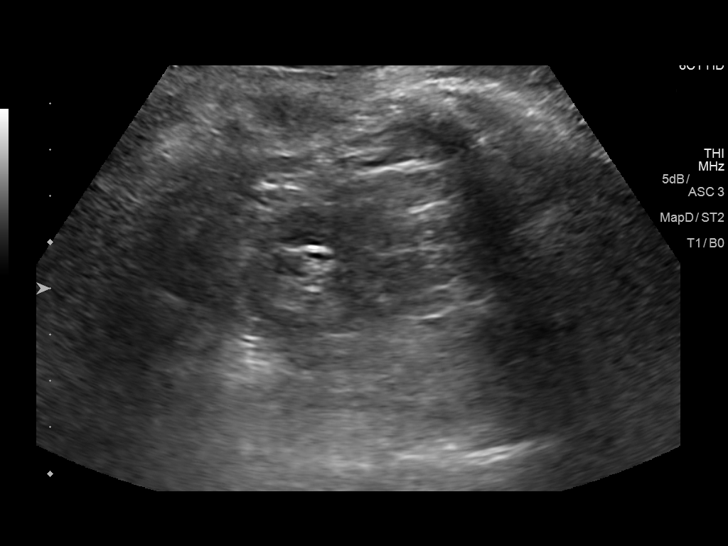
[im 33/36]
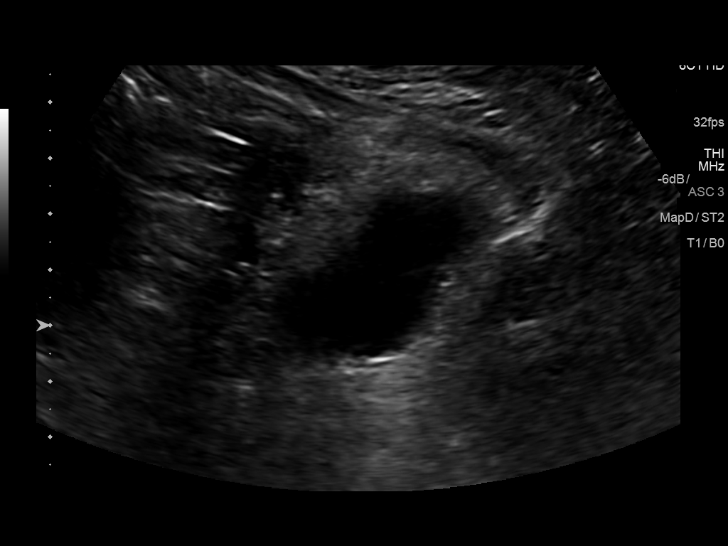
[im 36/36]
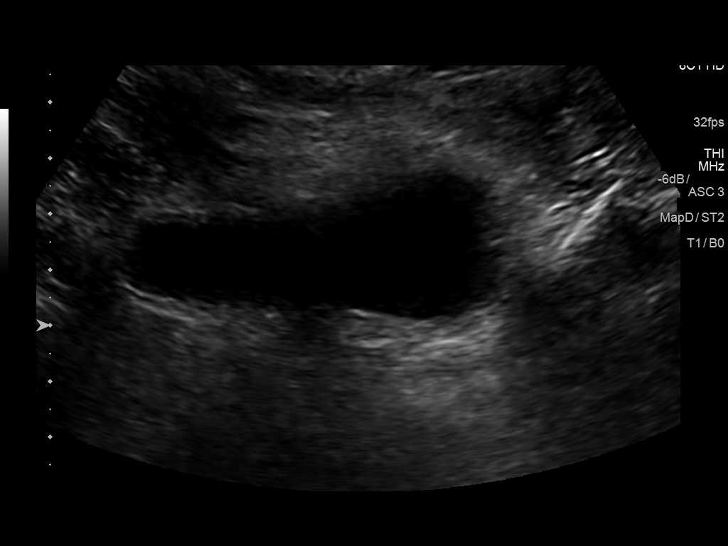

[14 of 25 positions shown; findings below may reference images not displayed]

FINDINGS: Right Kidney:

Renal measurements: 9.5 x 4.0 x 4.3 cm = volume: 85 mL. Marked
cortical thinning. Slightly increased cortical echogenicity. No
mass, hydronephrosis, or shadowing calcification.

Left Kidney:

Renal measurements: 9.4 x 5.1 x 4.7 cm = volume: 118 mL. Marked
cortical thinning. Increased cortical echogenicity. No mass,
hydronephrosis or shadowing calcification.

Bladder:

Appears normal for mild degree of bladder distention present on
exam.
IMPRESSION: Marked renal cortical atrophy and medical renal disease changes of
both kidneys.

No evidence of renal mass or hydronephrosis.

## 2020-06-08 ENCOUNTER — Other Ambulatory Visit: Payer: Self-pay | Admitting: Internal Medicine

## 2020-06-09 NOTE — Telephone Encounter (Signed)
Last OV 03/03/2020 (Virtual Visit)  Last filled 05/20/2020, # 10 with 0 refills

## 2020-06-12 NOTE — Telephone Encounter (Signed)
7 days early?

## 2020-06-12 NOTE — Telephone Encounter (Signed)
Please see message. °

## 2020-06-12 NOTE — Telephone Encounter (Signed)
Pt is wanting to know if she will get the medication of   butalbital-acetaminophen-caffeine (FIORICET) 50-325-40 MG tablet   She is still waiting to see  [id number] [place], [place] - [address]  [address], [place] [address]  Phone:  [phone] Fax:  [phone]   Please call the pt with an update [phone]

## 2020-06-24 DIAGNOSIS — M1712 Unilateral primary osteoarthritis, left knee: Secondary | ICD-10-CM | POA: Diagnosis not present

## 2020-06-24 DIAGNOSIS — M25562 Pain in left knee: Secondary | ICD-10-CM | POA: Diagnosis not present

## 2020-07-01 DIAGNOSIS — M1711 Unilateral primary osteoarthritis, right knee: Secondary | ICD-10-CM | POA: Diagnosis not present

## 2020-07-01 DIAGNOSIS — M25561 Pain in right knee: Secondary | ICD-10-CM | POA: Diagnosis not present

## 2020-07-09 ENCOUNTER — Other Ambulatory Visit: Payer: Self-pay | Admitting: Internal Medicine

## 2020-07-10 MED ORDER — LISINOPRIL 10 MG PO TABS: 10.0000 mg | ORAL_TABLET | Freq: Every day | ORAL | 0 refills | Status: DC

## 2020-07-10 NOTE — Addendum Note (Signed)
Addended by: Kathreen Devoid on: 07/10/2020 08:12 AM   Modules accepted: Orders

## 2020-07-10 NOTE — Addendum Note (Signed)
Addended by: Dajah Fischman E on: 07/10/2020 04:44 PM   Modules accepted: Orders  

## 2020-07-13 ENCOUNTER — Other Ambulatory Visit: Payer: Self-pay | Admitting: Internal Medicine

## 2020-07-13 ENCOUNTER — Telehealth: Payer: Self-pay | Admitting: Internal Medicine

## 2020-07-13 NOTE — Telephone Encounter (Signed)
Patient needs Butalbital filled. Patient is out of medication.  Patient is requesting a call once the medication is called in.  [facility] [place]                    [phone]  *Last time there was some sort of restriction on the medication so the pt had to call and get Dr. Fabian Sharp to release it.  She was wanting to make sure there was no restrictions this time.

## 2020-07-13 NOTE — Telephone Encounter (Signed)
I just sent it in   dont see a restriction on this   Let us know if a problem  Please contact patient  As she requested

## 2020-07-14 NOTE — Telephone Encounter (Signed)
Left a detailed message at the pts cell number with the information below. 

## 2020-07-27 ENCOUNTER — Other Ambulatory Visit: Payer: Self-pay | Admitting: Internal Medicine

## 2020-07-29 ENCOUNTER — Telehealth: Payer: Self-pay

## 2020-07-29 NOTE — Telephone Encounter (Signed)
Patient is wanting to know if she is going to have to a toxic screening when she comes in for her physical     Please call and advise

## 2020-07-30 ENCOUNTER — Telehealth: Payer: Self-pay | Admitting: Internal Medicine

## 2020-07-30 NOTE — Telephone Encounter (Signed)
Called lvm for call back to clarify what type of screening

## 2020-07-30 NOTE — Telephone Encounter (Signed)
Pt is calling in to see why there is a restriction on the Rx alprazolam Prudy Feeler) 1 MG b/c the pharmacy has told her that there is a "Hold date on the Rx (for November 10th) and normally there is not a hold date on it b/c state normally don't allow it to be filled on the 27th day.  She would like to know if the law has changed (updated to be filled on the 28th or 29th day) so that she will know what to expect in the future. [facility] [address] [place], [place]   Pt would like to have a call back with the clarification.

## 2020-07-31 NOTE — Telephone Encounter (Signed)
I didn't  put restriction on it  But  Just refilled it   Not sure why . I just refilled as is . Marland Kitchen  Please contact pharmacyand figure out  Why this was on the refill   Requests

## 2020-08-03 DIAGNOSIS — L648 Other androgenic alopecia: Secondary | ICD-10-CM | POA: Diagnosis not present

## 2020-08-03 DIAGNOSIS — L821 Other seborrheic keratosis: Secondary | ICD-10-CM | POA: Diagnosis not present

## 2020-08-03 DIAGNOSIS — L2089 Other atopic dermatitis: Secondary | ICD-10-CM | POA: Diagnosis not present

## 2020-08-03 NOTE — Telephone Encounter (Signed)
Spoke with pharmacy on 07/31/2020, called and discussed with patient

## 2020-08-11 ENCOUNTER — Other Ambulatory Visit: Payer: Self-pay | Admitting: Internal Medicine

## 2020-08-11 NOTE — Telephone Encounter (Signed)
Upcoming OV in 12/21

## 2020-08-12 NOTE — Telephone Encounter (Signed)
Pt would like to know if her refill request could be called in today for her Butalbital-APAP-Caffeine  Pt called back

## 2020-08-31 DIAGNOSIS — Z1231 Encounter for screening mammogram for malignant neoplasm of breast: Secondary | ICD-10-CM | POA: Diagnosis not present

## 2020-08-31 DIAGNOSIS — L900 Lichen sclerosus et atrophicus: Secondary | ICD-10-CM | POA: Diagnosis not present

## 2020-08-31 DIAGNOSIS — N951 Menopausal and female climacteric states: Secondary | ICD-10-CM | POA: Diagnosis not present

## 2020-08-31 LAB — HM MAMMOGRAPHY

## 2020-09-07 ENCOUNTER — Telehealth: Payer: Self-pay | Admitting: Internal Medicine

## 2020-09-07 ENCOUNTER — Other Ambulatory Visit: Payer: Self-pay | Admitting: Internal Medicine

## 2020-09-07 NOTE — Telephone Encounter (Signed)
Pt call and stated she want  butalbital-acetaminophen-caffeine (FIORICET) 50-325-40 MG tablet sent to  [id number] - [place], [place] - [address] Phone:  [phone]  Fax:  [phone]    Pt want a call back when sent.

## 2020-09-07 NOTE — Telephone Encounter (Signed)
Can you advise on refill in Dr. Rosezella [place] absence?

## 2020-09-08 NOTE — Telephone Encounter (Signed)
I spoke with pt. She is aware Rx was sent in.  

## 2020-09-21 ENCOUNTER — Encounter: Payer: Medicare Other | Admitting: Internal Medicine

## 2020-09-23 DIAGNOSIS — M170 Bilateral primary osteoarthritis of knee: Secondary | ICD-10-CM | POA: Diagnosis not present

## 2020-09-23 DIAGNOSIS — H0014 Chalazion left upper eyelid: Secondary | ICD-10-CM | POA: Diagnosis not present

## 2020-10-04 ENCOUNTER — Other Ambulatory Visit: Payer: Self-pay | Admitting: Internal Medicine

## 2020-10-05 ENCOUNTER — Other Ambulatory Visit: Payer: Self-pay | Admitting: Adult Health

## 2020-10-05 NOTE — Telephone Encounter (Signed)
Last telephone visit- 03/03/2020 Last refill- 09/07/2020--- 10 tabs with no refills  Pharmacy is updated

## 2020-10-06 ENCOUNTER — Encounter: Payer: Self-pay | Admitting: Internal Medicine

## 2020-10-19 ENCOUNTER — Other Ambulatory Visit: Payer: Self-pay | Admitting: Internal Medicine

## 2020-10-21 NOTE — Telephone Encounter (Signed)
Sent in electronically .  

## 2020-10-26 ENCOUNTER — Encounter: Payer: Medicare Other | Admitting: Internal Medicine

## 2020-11-02 ENCOUNTER — Other Ambulatory Visit: Payer: Self-pay | Admitting: Internal Medicine

## 2020-11-02 ENCOUNTER — Telehealth: Payer: Self-pay

## 2020-11-03 MED ORDER — BUSPIRONE HCL 10 MG PO TABS
ORAL_TABLET | ORAL | 1 refills | Status: DC
Start: 2020-11-03 — End: 2021-07-15

## 2020-11-04 ENCOUNTER — Other Ambulatory Visit: Payer: Self-pay | Admitting: Internal Medicine

## 2020-11-04 NOTE — Telephone Encounter (Signed)
Pt called to check the status of the below msg and is aware that it is on the providers desktop to address.  Pt would like to have a call once it has been sent to the pharmacy.

## 2020-11-04 NOTE — Telephone Encounter (Signed)
Ptient called in reference to this refill. The pharmacy is stating that they haven't received the Rx and I advised the patient that it was sent on 11/02/2020.   The patient is going to contact the pharmacy

## 2020-11-05 NOTE — Telephone Encounter (Signed)
Sent in electronically .  Again should go through

## 2020-11-06 NOTE — Telephone Encounter (Addendum)
[facility] [place] patient picked up refill of Buspar on 10/25/2020.   Another refill was submitted on 11/03/2020 for three month supply.    Refill was received by pharmacy

## 2020-11-30 ENCOUNTER — Other Ambulatory Visit: Payer: Self-pay

## 2020-11-30 ENCOUNTER — Other Ambulatory Visit: Payer: Self-pay | Admitting: Internal Medicine

## 2020-12-01 MED ORDER — BUTALBITAL-APAP-CAFFEINE 50-325-40 MG PO TABS: ORAL_TABLET | ORAL | 0 refills | Status: DC

## 2020-12-01 NOTE — Telephone Encounter (Signed)
Sent in electronically .  

## 2020-12-17 DIAGNOSIS — H25813 Combined forms of age-related cataract, bilateral: Secondary | ICD-10-CM | POA: Diagnosis not present

## 2020-12-17 DIAGNOSIS — H401131 Primary open-angle glaucoma, bilateral, mild stage: Secondary | ICD-10-CM | POA: Diagnosis not present

## 2020-12-21 ENCOUNTER — Encounter: Payer: Medicare Other | Admitting: Internal Medicine

## 2020-12-21 NOTE — Progress Notes (Deleted)
No chief complaint on file.   HPI: Brooke Pennington 70 y.o. comes in today for Preventive Medicare exam/ wellness visit  And med evaluation.Since last visit.  BP Anxiety  meds   Sleep HLD  Renal function  Health Maintenance  Topic Date Due  . PNA vac Low Risk Adult (1 of 2 - PCV13) Never done  . INFLUENZA VACCINE  04/26/2020  . COVID-19 Vaccine (3 - Booster for Pfizer series) 08/06/2020  . MAMMOGRAM  08/31/2022  . TETANUS/TDAP  01/18/2026  . COLONOSCOPY (Pts 45-35yrs Insurance coverage will need to be confirmed)  06/20/2027  . DEXA SCAN  Completed  . Hepatitis C Screening  Completed  . HPV VACCINES  Aged Out   Health Maintenance Review LIFESTYLE:  Exercise:   Tobacco/ETS: Alcohol:  Sugar beverages: Sleep: Drug use: no HH:      Hearing:   Vision:  No limitations at present . Last eye check UTD  Safety:  Has smoke detector and wears seat belts.  . No excess sun exposure. Sees dentist regularly.  Falls:   Advance directive :  Reviewed  Has one.  Memory: Felt to be good  , no concern from her or her family.  Depression: No anhedonia unusual crying or depressive symptoms  Nutrition: Eats well balanced diet; adequate calcium and vitamin D. No swallowing chewing problems.  Injury: no major injuries in the last six months.  Other healthcare providers:  Reviewed today .  Preventive parameters: up-to-date  Reviewed   ADLS:   There are no problems or need for assistance  driving, feeding, obtaining food, dressing, toileting and bathing, managing money using phone. She is independent.    ROS:  GEN/ HEENT: No fever, significant weight changes sweats headaches vision problems hearing changes, CV/ PULM; No chest pain shortness of breath cough, syncope,edema  change in exercise tolerance. GI /GU: No adominal pain, vomiting, change in bowel habits. No blood in the stool. No significant GU symptoms. SKIN/HEME: ,no acute skin rashes suspicious lesions or bleeding. No  lymphadenopathy, nodules, masses.  NEURO/ PSYCH:  No neurologic signs such as weakness numbness. No depression anxiety. IMM/ Allergy: No unusual infections.  Allergy .   REST of 12 system review negative except as per HPI   Past Medical History:  Diagnosis Date  . Anal fissure    hx of same  . Dependence, barbiturates ? 05/01/2013   Have come to the conclusion that she is dependent on this medication based on her history and the fact that she feels better when she takes it   . Eczema   . H/O rectal polypectomy    ?  uncertain  if colonic or rectal colonsocopy 8 2007 [facility]  . Headache(784.0)   . Hypertension   . Lymphocytic gastritis    neg SBBX for celiac  in 8 2007   . Medication management failed tox screen 11/27/2013   Neg for xanax and butalbital on urine screen . 2 /15   . Menopause    age 44 with sx no hrt  . PMR (polymyalgia rheumatica) (HCC)    Possible diagnoses by primary care physician with her anemia and elevated sedimentation rate of 60 was on prednisone but stopped it herself and then felt fine    Family History  Problem Relation Age of Onset  . Coronary artery disease Father   . Heart disease Father   . COPD Mother   . Anemia Sister        needing transfusion some>  vit b12 ?    Social History   Socioeconomic History  . Marital status: Married    Spouse name: Not on file  . Number of children: Not on file  . Years of education: Not on file  . Highest education level: Not on file  Occupational History  . Not on file  Tobacco Use  . Smoking status: Never Smoker  . Smokeless tobacco: Never Used  . Tobacco comment: never used tobacco  Vaping Use  . Vaping Use: Never used  Substance and Sexual Activity  . Alcohol use: Yes    Alcohol/week: 1.0 standard drink    Types: 1 Glasses of wine per week    Comment: ocassionally  . Drug use: No  . Sexual activity: Yes  Other Topics Concern  . 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  Has smoke detector and wears seat belts.  No firearms. No excess sun exposure. Sees dentist regularly . No depression      Only 1 car at this time   No etoh   Father passed dec 23 15 chf heart related age 76      Social Determinants of Health   Financial Resource Strain: Not on file  Food Insecurity: Not on file  Transportation Needs: Not on file  Physical Activity: Not on file  Stress: Not on file  Social Connections: Not on file    Outpatient Encounter Medications as of 12/21/2020  Medication Sig  . ALPRAZolam (XANAX) 1 MG tablet TAKE 1 TABLET BY MOUTH FOUR TIMES A DAY AS NEEDED (08/05/20)  . busPIRone (BUSPAR) 10 MG tablet TAKE 1 AND 1/2 TABLETS BY MOUTH 3 TIMES DAILY  . butalbital-acetaminophen-caffeine (FIORICET) 50-325-40 MG tablet TAKE 1 TO 2 TABLETS BY MOUTH AS NEEDED AS DIRECTED  . finasteride (PROSCAR) 5 MG tablet Take 2.5 mg by mouth daily.  . hydroquinone 4 % cream Apply topically 2 (two) times daily.  Marland Kitchen latanoprost (XALATAN) 0.005 % ophthalmic solution Place 1 drop into both eyes at bedtime.   Marland Kitchen LATISSE 0.03 % ophthalmic solution APPLY 1 DROP AT THE BASE OF THE UPPER EYELASHES EVERY DAY  . lisinopril (ZESTRIL) 10 MG tablet TAKE 1 TABLET BY MOUTH EVERY DAY**NEEDS OFFICE VISIT**  . MULTIPLE VITAMIN PO Take 1 tablet by mouth daily.  Marland Kitchen tretinoin (RETIN-A) 0.1 % cream Apply 1 application topically at bedtime.   . triamcinolone cream (KENALOG) 0.1 %    No facility-administered encounter medications on file as of 12/21/2020.    EXAM:  There were no vitals taken for this visit.  There is no height or weight on file to calculate BMI.  Physical Exam: Vital signs reviewed WNI:OEVO is a well-developed well-nourished alert cooperative   who appears stated age in no acute distress.  HEENT: normocephalic atraumatic , Eyes: PERRL EOM's full, conjunctiva clear, Nares: paten,t  no deformity discharge or tenderness., Ears: no deformity EAC's clear TMs with normal landmarks. Mouth:masked NECK: supple without masses, thyromegaly or bruits. CHEST/PULM:  Clear to auscultation and percussion breath sounds equal no wheeze , rales or rhonchi. No chest wall deformities or tenderness. CV: PMI is nondisplaced, S1 S2 no gallops, murmurs, rubs. Peripheral pulses are full without delay.No JVD .  ABDOMEN: Bowel sounds normal nontender  No guard or rebound, no hepato splenomegal no CVA tenderness.   Extremtities:  No clubbing cyanosis or edema, no acute joint swelling or redness no focal atrophy NEURO:  Oriented x3, cranial nerves 3-12 appear to be intact, no obvious focal weakness,gait within normal limits no abnormal reflexes or asymmetrical SKIN: No acute rashes normal turgor, color, no bruising or petechiae. PSYCH: Oriented, good eye contact, no obvious depression anxiety, cognition and judgment appear normal. LN: no cervical axillary inguinal adenopathy No noted deficits in memory, attention, and speech.   Lab Results  Component Value Date   WBC 5.3 08/06/2019   HGB 12.7 08/06/2019   HCT 38.3 08/06/2019   PLT 270.0 08/06/2019   GLUCOSE 91 08/06/2019   CHOL 249 (H) 08/06/2019   TRIG 123.0 08/06/2019   HDL 70.10 08/06/2019   LDLDIRECT 168.2 12/25/2012   LDLCALC 154 (H) 08/06/2019   ALT 12 08/06/2019   AST 17 08/06/2019   NA 137 08/06/2019   K 4.9 08/06/2019   CL 102 08/06/2019   CREATININE 1.13 08/06/2019   BUN 27 (H) 08/06/2019   CO2 29 08/06/2019   TSH 2.47 08/06/2019    ASSESSMENT AND PLAN:  Discussed the following assessment and plan:  Visit for preventive health examination  Essential hypertension  Medication management  Anxiety state  Long term prescription benzodiazepine use  Renal insufficiency  Hyperlipidemia, unspecified hyperlipidemia type Labs dued  Patient Care Team: Madelin Headings, MD as PCP - General (Internal  Medicine) Marella Chimes, Rose Phi, MD as Consulting Physician (Oncology)  There are no Patient Instructions on file for this visit.  Neta Mends. Joleen Stuckert M.D.

## 2020-12-24 ENCOUNTER — Telehealth: Payer: Self-pay | Admitting: Internal Medicine

## 2020-12-24 NOTE — Telephone Encounter (Signed)
Left message for patient to call back and schedule Medicare Annual Wellness Visit (AWV) either virtually or in office. No detailed message left   Last AWV 05/02/18  please schedule at anytime with [facility] Nurse Health Advisor 1 or 2   This should be a 45 minute visit.

## 2020-12-28 ENCOUNTER — Other Ambulatory Visit: Payer: Self-pay | Admitting: Internal Medicine

## 2020-12-28 NOTE — Telephone Encounter (Signed)
Last OV: 12/21/2020 Last Refill: 12/01/2020 Disp: 10    R: 0 Future OV: 01/27/2021  Is this ok to refill?

## 2020-12-29 ENCOUNTER — Other Ambulatory Visit: Payer: Self-pay | Admitting: Internal Medicine

## 2020-12-30 NOTE — Telephone Encounter (Signed)
The patient would like to know if you can refill this Rx by 2 PM today please?  [id number] [place] - [address] Phone:  [phone]  Fax:  [phone]       The patient would like to be contacted when the medication is sent to the pharmacy.

## 2020-12-30 NOTE — Telephone Encounter (Signed)
Notify patient refill was sent in

## 2020-12-31 NOTE — Telephone Encounter (Signed)
Left a detailed message notifying the patient that her prescriptions was sent to her local pharmacy.

## 2021-01-07 ENCOUNTER — Other Ambulatory Visit: Payer: Self-pay | Admitting: Internal Medicine

## 2021-01-07 NOTE — Telephone Encounter (Signed)
Noted  

## 2021-01-13 DIAGNOSIS — M170 Bilateral primary osteoarthritis of knee: Secondary | ICD-10-CM | POA: Diagnosis not present

## 2021-01-22 ENCOUNTER — Other Ambulatory Visit: Payer: Self-pay | Admitting: Internal Medicine

## 2021-01-26 ENCOUNTER — Other Ambulatory Visit: Payer: Self-pay

## 2021-01-27 ENCOUNTER — Encounter: Payer: Self-pay | Admitting: Internal Medicine

## 2021-01-27 ENCOUNTER — Ambulatory Visit (INDEPENDENT_AMBULATORY_CARE_PROVIDER_SITE_OTHER): Payer: Medicare Other | Admitting: Internal Medicine

## 2021-01-27 VITALS — BP 136/78 | HR 71 | Temp 97.9°F | Ht 62.75 in | Wt 153.4 lb

## 2021-01-27 DIAGNOSIS — Z0000 Encounter for general adult medical examination without abnormal findings: Secondary | ICD-10-CM | POA: Diagnosis not present

## 2021-01-27 DIAGNOSIS — Z79899 Other long term (current) drug therapy: Secondary | ICD-10-CM

## 2021-01-27 DIAGNOSIS — E785 Hyperlipidemia, unspecified: Secondary | ICD-10-CM

## 2021-01-27 DIAGNOSIS — I10 Essential (primary) hypertension: Secondary | ICD-10-CM

## 2021-01-27 DIAGNOSIS — D649 Anemia, unspecified: Secondary | ICD-10-CM

## 2021-01-27 DIAGNOSIS — F411 Generalized anxiety disorder: Secondary | ICD-10-CM

## 2021-01-27 LAB — TSH: TSH: 4.24 u[IU]/mL (ref 0.35–4.50)

## 2021-01-27 LAB — BASIC METABOLIC PANEL
BUN: 31 mg/dL — ABNORMAL HIGH (ref 6–23)
CO2: 32 mEq/L (ref 19–32)
Calcium: 10.2 mg/dL (ref 8.4–10.5)
Chloride: 100 mEq/L (ref 96–112)
Creatinine, Ser: 1.3 mg/dL — ABNORMAL HIGH (ref 0.40–1.20)
GFR: 42.44 mL/min — ABNORMAL LOW (ref >60.00)
Glucose, Bld: 85 mg/dL (ref 70–99)
Potassium: 4.5 mEq/L (ref 3.5–5.1)
Sodium: 139 mEq/L (ref 135–145)

## 2021-01-27 LAB — LIPID PANEL
Cholesterol: 278 mg/dL — ABNORMAL HIGH (ref 0–200)
HDL: 66.5 mg/dL (ref >39.00)
LDL Cholesterol: 186 mg/dL — ABNORMAL HIGH (ref 0–99)
NonHDL: 211.61
Total CHOL/HDL Ratio: 4
Triglycerides: 129 mg/dL (ref 0.0–149.0)
VLDL: 25.8 mg/dL (ref 0.0–40.0)

## 2021-01-27 LAB — CBC WITH DIFFERENTIAL/PLATELET
Basophils Absolute: 0 10*3/uL (ref 0.0–0.1)
Basophils Relative: 0.4 % (ref 0.0–3.0)
Eosinophils Absolute: 0 10*3/uL (ref 0.0–0.7)
Eosinophils Relative: 0.4 % (ref 0.0–5.0)
HCT: 37.8 % (ref 36.0–46.0)
Hemoglobin: 12.7 g/dL (ref 12.0–15.0)
Lymphocytes Relative: 20.9 % (ref 12.0–46.0)
Lymphs Abs: 2 10*3/uL (ref 0.7–4.0)
MCHC: 33.7 g/dL (ref 30.0–36.0)
MCV: 95.9 fl (ref 78.0–100.0)
Monocytes Absolute: 0.5 10*3/uL (ref 0.1–1.0)
Monocytes Relative: 5 % (ref 3.0–12.0)
Neutro Abs: 7 10*3/uL (ref 1.4–7.7)
Neutrophils Relative %: 73.3 % (ref 43.0–77.0)
Platelets: 299 10*3/uL (ref 150.0–400.0)
RBC: 3.94 Mil/uL (ref 3.87–5.11)
RDW: 13.1 % (ref 11.5–15.5)
WBC: 9.5 10*3/uL (ref 4.0–10.5)

## 2021-01-27 LAB — HEMOGLOBIN A1C: Hgb A1c MFr Bld: 5.5 % (ref 4.6–6.5)

## 2021-01-27 LAB — HEPATIC FUNCTION PANEL
ALT: 13 U/L (ref 0–35)
AST: 16 U/L (ref 0–37)
Albumin: 4.6 g/dL (ref 3.5–5.2)
Alkaline Phosphatase: 62 U/L (ref 39–117)
Bilirubin, Direct: 0.1 mg/dL (ref 0.0–0.3)
Total Bilirubin: 0.6 mg/dL (ref 0.2–1.2)
Total Protein: 6.8 g/dL (ref 6.0–8.3)

## 2021-01-27 MED ORDER — BUTALBITAL-APAP-CAFFEINE 50-325-40 MG PO TABS: ORAL_TABLET | ORAL | 1 refills | Status: DC

## 2021-01-27 NOTE — Patient Instructions (Addendum)
Continue healthy lifestyle attention and picking up activity would be helpful. Lab work today. I sent in 2 refills on the butalbital that should last 2 months.  Fredrik Cove doing well otherwise  Consider pneumonia vaccine.  Recommended age 68 and above    Health Maintenance, Female Adopting a healthy lifestyle and getting preventive care are important in promoting health and wellness. Ask your health care provider about:  The right schedule for you to have regular tests and exams.  Things you can do on your own to prevent diseases and keep yourself healthy. What should I know about diet, weight, and exercise? Eat a healthy diet  Eat a diet that includes plenty of vegetables, fruits, low-fat dairy products, and lean protein.  Do not eat a lot of foods that are high in solid fats, added sugars, or sodium.   Maintain a healthy weight Body mass index (BMI) is used to identify weight problems. It estimates body fat based on height and weight. Your health care provider can help determine your BMI and help you achieve or maintain a healthy weight. Get regular exercise Get regular exercise. This is one of the most important things you can do for your health. Most adults should:  Exercise for at least 150 minutes each week. The exercise should increase your heart rate and make you sweat (moderate-intensity exercise).  Do strengthening exercises at least twice a week. This is in addition to the moderate-intensity exercise.  Spend less time sitting. Even light physical activity can be beneficial. Watch cholesterol and blood lipids Have your blood tested for lipids and cholesterol at 67 years of age, then have this test every 5 years. Have your cholesterol levels checked more often if:  Your lipid or cholesterol levels are high.  You are older than 68 years of age.  You are at high risk for heart disease. What should I know about cancer screening? Depending on your health history and family  history, you may need to have cancer screening at various ages. This may include screening for:  Breast cancer.  Cervical cancer.  Colorectal cancer.  Skin cancer.  Lung cancer. What should I know about heart disease, diabetes, and high blood pressure? Blood pressure and heart disease  High blood pressure causes heart disease and increases the risk of stroke. This is more likely to develop in people who have high blood pressure readings, are of African descent, or are overweight.  Have your blood pressure checked: ? Every 3-5 years if you are 59-62 years of age. ? Every year if you are 18 years old or older. Diabetes Have regular diabetes screenings. This checks your fasting blood sugar level. Have the screening done:  Once every three years after age 34 if you are at a normal weight and have a low risk for diabetes.  More often and at a younger age if you are overweight or have a high risk for diabetes. What should I know about preventing infection? Hepatitis B If you have a higher risk for hepatitis B, you should be screened for this virus. Talk with your health care provider to find out if you are at risk for hepatitis B infection. Hepatitis C Testing is recommended for:  Everyone born from [birth year] through 1963.  Anyone with known risk factors for hepatitis C. Sexually transmitted infections (STIs)  Get screened for STIs, including gonorrhea and chlamydia, if: ? You are sexually active and are younger than 68 years of age. ? You are older than 68 years  of age and your health care provider tells you that you are at risk for this type of infection. ? Your sexual activity has changed since you were last screened, and you are at increased risk for chlamydia or gonorrhea. Ask your health care provider if you are at risk.  Ask your health care provider about whether you are at high risk for HIV. Your health care provider may recommend a prescription medicine to help prevent HIV  infection. If you choose to take medicine to prevent HIV, you should first get tested for HIV. You should then be tested every 3 months for as long as you are taking the medicine. Pregnancy  If you are about to stop having your period (premenopausal) and you may become pregnant, seek counseling before you get pregnant.  Take 400 to 800 micrograms (mcg) of folic acid every day if you become pregnant.  Ask for birth control (contraception) if you want to prevent pregnancy. Osteoporosis and menopause Osteoporosis is a disease in which the bones lose minerals and strength with aging. This can result in bone fractures. If you are 42 years old or older, or if you are at risk for osteoporosis and fractures, ask your health care provider if you should:  Be screened for bone loss.  Take a calcium or vitamin D supplement to lower your risk of fractures.  Be given hormone replacement therapy (HRT) to treat symptoms of menopause. Follow these instructions at home: Lifestyle  Do not use any products that contain nicotine or tobacco, such as cigarettes, e-cigarettes, and chewing tobacco. If you need help quitting, ask your health care provider.  Do not use street drugs.  Do not share needles.  Ask your health care provider for help if you need support or information about quitting drugs. Alcohol use  Do not drink alcohol if: ? Your health care provider tells you not to drink. ? You are pregnant, may be pregnant, or are planning to become pregnant.  If you drink alcohol: ? Limit how much you use to 0-1 drink a day. ? Limit intake if you are breastfeeding.  Be aware of how much alcohol is in your drink. In the [place] one drink equals one 12 oz bottle of beer (355 mL), one 5 oz glass of wine (148 mL), or one 1 oz glass of hard liquor (44 mL). General instructions  Schedule regular health, dental, and eye exams.  Stay current with your vaccines.  Tell your health care provider if: ? You  often feel depressed. ? You have ever been abused or do not feel safe at home. Summary  Adopting a healthy lifestyle and getting preventive care are important in promoting health and wellness.  Follow your health care provider's instructions about healthy diet, exercising, and getting tested or screened for diseases.  Follow your health care provider's instructions on monitoring your cholesterol and blood pressure. This information is not intended to replace advice given to you by your health care provider. Make sure you discuss any questions you have with your health care provider. Document Revised: 09/05/2018 Document Reviewed: 09/05/2018 Elsevier Patient Education  2021 [employer].

## 2021-01-27 NOTE — Progress Notes (Signed)
Chief Complaint  Patient presents with  . Annual Exam    CPE/labs.  Fasting today. No concerns.      HPI: Brooke Pennington 70 y.o. comes in today for Preventive Medicare exam/ wellness visit .Since last   BP  Has taken meds   Not checking ( anxiety effect)   firoicet  Ask for 20   This time  For 2 mos   Still taking  Anxiety doing ok .   Taking  Alprazolam  . The same  Not as active since pandemic   But doing ok otherwise     Health Maintenance  Topic Date Due  . PNA vac Low Risk Adult (1 of 2 - PCV13) Never done  . COVID-19 Vaccine (3 - Booster for Pfizer series) 08/06/2020  . INFLUENZA VACCINE  04/26/2021  . MAMMOGRAM  08/31/2022  . TETANUS/TDAP  01/18/2026  . COLONOSCOPY (Pts 45-70yrs Insurance coverage will need to be confirmed)  06/20/2027  . DEXA SCAN  Completed  . Hepatitis C Screening  Completed  . HPV VACCINES  Aged Out   Health Maintenance Review LIFESTYLE:  Exercise:   Less   Slacked off some.  Tobacco/ETS: n Alcohol: n Sugar beverages: ocass  Sleep:  About 8-9  Drug use: no HH: 2  No pets     Hearing:  Ok   Vision:  No limitations at present . Last eye check UTD  Safety:  Has smoke detector and wears seat belts. . No excess sun exposure. Sees dentist regularly.  Falls: n  Memory: Felt to be good  , no concern from her or her family.  Depression: No anhedonia unusual crying or depressive symptoms  Nutrition: Eats well balanced diet; adequate calcium and vitamin D. No swallowing chewing problems.  Injury: no major injuries in the last six months.  Other healthcare providers:  Reviewed today .  Preventive parameters: up-to-date  Reviewed   ADLS:   There are no problems or need for assistance  driving, feeding, obtaining food, dressing, toileting and bathing, managing money using phone. She is independent.    ROS:  REST of 12 system review negative except as per HPI   Past Medical History:  Diagnosis Date  . Allergy   . Anal fissure     hx of same  . Dependence, barbiturates ? 05/01/2013   Have come to the conclusion that she is dependent on this medication based on her history and the fact that she feels better when she takes it   . Eczema   . H/O rectal polypectomy    ?  uncertain  if colonic or rectal colonsocopy 8 2007 [facility] [place] [facility]  . Headache(784.0)   . Hypertension   . Lymphocytic gastritis    neg SBBX for celiac  in 8 2007   . Medication management failed tox screen 11/27/2013   Neg for xanax and butalbital on urine screen . 2 /15   . Menopause    age 24 with sx no hrt  . PMR (polymyalgia rheumatica) (HCC)    Possible diagnoses by primary care physician with her anemia and elevated sedimentation rate of 60 was on prednisone but stopped it herself and then felt fine    Family History  Problem Relation Age of Onset  . Coronary artery disease Father   . Heart disease Father   . COPD Mother   . Anemia Sister        needing transfusion some> vit b12 ?    Social History  Socioeconomic History  . Marital status: Married    Spouse name: Not on file  . Number of children: Not on file  . Years of education: Not on file  . Highest education level: Not on file  Occupational History  . Not on file  Tobacco Use  . Smoking status: Never Smoker  . Smokeless tobacco: Never Used  . Tobacco comment: never used tobacco  Vaping Use  . Vaping Use: Never used  Substance and Sexual Activity  . Alcohol use: Not Currently    Alcohol/week: 1.0 standard drink    Types: 1 Glasses of wine per week    Comment: ocassionally  . Drug use: No  . Sexual activity: Yes  Other Topics Concern  . 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  Has smoke detector and wears seat belts.  No firearms. No excess sun exposure. Sees dentist regularly . No depression      Only 1 car at this time   No etoh   Father  passed dec 23 15 chf heart related age 64      Social Determinants of Health   Financial Resource Strain: Not on file  Food Insecurity: Not on file  Transportation Needs: Not on file  Physical Activity: Not on file  Stress: Not on file  Social Connections: Not on file    Outpatient Encounter Medications as of 01/27/2021  Medication Sig  . ALPRAZolam (XANAX) 1 MG tablet TAKE 1 TABLET BY MOUTH FOUR TIMES A DAY AS NEEDED  . busPIRone (BUSPAR) 10 MG tablet TAKE 1 AND 1/2 TABLETS BY MOUTH 3 TIMES DAILY  . finasteride (PROSCAR) 5 MG tablet Take 5 mg by mouth daily.  . hydroquinone 4 % cream Apply topically 2 (two) times daily.  Marland Kitchen latanoprost (XALATAN) 0.005 % ophthalmic solution Place 1 drop into both eyes at bedtime.   Marland Kitchen LATISSE 0.03 % ophthalmic solution APPLY 1 DROP AT THE BASE OF THE UPPER EYELASHES EVERY DAY  . lisinopril (ZESTRIL) 10 MG tablet TAKE 1 TABLET BY MOUTH EVERY DAY**NEEDS OFFICE VISIT**  . MULTIPLE VITAMIN PO Take 1 tablet by mouth daily.  Marland Kitchen tretinoin (RETIN-A) 0.1 % cream Apply 1 application topically at bedtime.   . triamcinolone cream (KENALOG) 0.1 %   . [DISCONTINUED] butalbital-acetaminophen-caffeine (FIORICET) 50-325-40 MG tablet TAKE 1 TO 2 TABLETS BY MOUTH AS NEEDED AS DIRECTED  . butalbital-acetaminophen-caffeine (FIORICET) 50-325-40 MG tablet TAKE 1 TO 2 TABLETS BY MOUTH AS NEEDED AS DIRECTED   No facility-administered encounter medications on file as of 01/27/2021.    EXAM:  BP 136/78   Pulse 71   Temp 97.9 F (36.6 C) (Oral)   Ht 5' 2.75" (1.594 m)   Wt 153 lb 6.4 oz (69.6 kg)   SpO2 99%   BMI 27.39 kg/m   Body mass index is 27.39 kg/m.  Physical Exam: Vital signs reviewed NAT:FTDD is a well-developed well-nourished alert cooperative   who appears stated age in no acute distress.  HEENT: normocephalic atraumatic , Eyes: PERRL EOM's full, conjunctiva clear, Nares: paten,t no deformity discharge or tenderness., Ears: no deformity EAC's clear TMs with  normal landmarks. Mouth:masked NECK: supple without masses, thyromegaly or bruits. CHEST/PULM:  Clear to auscultation and percussion breath sounds equal no wheeze , rales or rhonchi. No chest wall deformities or tenderness. CV: PMI  is nondisplaced, S1 S2 no gallops, murmurs, rubs. Peripheral pulses are full without delay.No JVD .  Breast: normal by inspection . No dimpling, discharge, masses, tenderness or discharge . ABDOMEN: Bowel sounds normal nontender  No guard or rebound, no hepato splenomegal no CVA tenderness.   Extremtities:  No clubbing cyanosis or edema, no acute joint swelling or redness no focal atrophy NEURO:  Oriented x3, cranial nerves 3-12 appear to be intact, no obvious focal weakness,gait within normal limits no abnormal reflexes or asymmetrical SKIN: No acute rashes normal turgor, color, no bruising or petechiae. PSYCH: Oriented, good eye contact, no obvious depression anxiety, cognition and judgment appear normal. LN: no cervical axillary inguinal adenopathy No noted deficits in memory, attention, and speech.   Lab Results  Component Value Date   WBC 9.5 01/27/2021   HGB 12.7 01/27/2021   HCT 37.8 01/27/2021   PLT 299.0 01/27/2021   GLUCOSE 85 01/27/2021   CHOL 278 (H) 01/27/2021   TRIG 129.0 01/27/2021   HDL 66.50 01/27/2021   LDLDIRECT 168.2 12/25/2012   LDLCALC 186 (H) 01/27/2021   ALT 13 01/27/2021   AST 16 01/27/2021   NA 139 01/27/2021   K 4.5 01/27/2021   CL 100 01/27/2021   CREATININE 1.30 (H) 01/27/2021   BUN 31 (H) 01/27/2021   CO2 32 01/27/2021   TSH 4.24 01/27/2021   HGBA1C 5.5 01/27/2021    ASSESSMENT AND PLAN:  Discussed the following assessment and plan:  Visit for preventive health examination  Medication management - Plan: Basic metabolic panel, CBC with Differential/Platelet, Hemoglobin A1c, Hepatic function panel, Lipid panel, TSH, TSH, Basic metabolic panel, CBC with Differential/Platelet, Hemoglobin A1c, Hepatic function panel,  Lipid panel  Long term prescription benzodiazepine use - Plan: Basic metabolic panel, CBC with Differential/Platelet, Hemoglobin A1c, Hepatic function panel, Lipid panel, TSH, TSH, Basic metabolic panel, CBC with Differential/Platelet, Hemoglobin A1c, Hepatic function panel, Lipid panel  Anxiety state - Plan: Basic metabolic panel, CBC with Differential/Platelet, Hemoglobin A1c, Hepatic function panel, Lipid panel, TSH, TSH, Basic metabolic panel, CBC with Differential/Platelet, Hemoglobin A1c, Hepatic function panel, Lipid panel  Essential hypertension - Plan: Basic metabolic panel, CBC with Differential/Platelet, Hemoglobin A1c, Hepatic function panel, Lipid panel, TSH, TSH, Basic metabolic panel, CBC with Differential/Platelet, Hemoglobin A1c, Hepatic function panel, Lipid panel  Hyperlipidemia, unspecified hyperlipidemia type - Plan: Basic metabolic panel, CBC with Differential/Platelet, Hemoglobin A1c, Hepatic function panel, Lipid panel, TSH, TSH, Basic metabolic panel, CBC with Differential/Platelet, Hemoglobin A1c, Hepatic function panel, Lipid panel  Mild anemia - Plan: Basic metabolic panel, CBC with Differential/Platelet, Hemoglobin A1c, Hepatic function panel, Lipid panel, TSH, TSH, Basic metabolic panel, CBC with Differential/Platelet, Hemoglobin A1c, Hepatic function panel, Lipid panel Updated labs  Today   Pt plans on increasing  Activity level. Will refill 10 Fioricet  With 1 refill  . hcm utd  Plan fu 6 mos if all going well  Or as indicated  Pt tolerating med  Wants to stay on for anxiety control   At this time  benefit more than risk and has been on benzo for many years.  Patient Care Team: Giordano Getman, Neta Mends, MD as PCP - General (Internal Medicine) Marella Chimes, Rose Phi, MD as Consulting Physician (Oncology)  Patient Instructions   Continue healthy lifestyle attention and picking up activity would be helpful. Lab work today. I sent in 2 refills on the butalbital  that should last 2 months.  Fredrik Cove doing well otherwise  Consider pneumonia vaccine.  Recommended age 32 and above  Health Maintenance, Female Adopting a healthy lifestyle and getting preventive care are important in promoting health and wellness. Ask your health care provider about:  The right schedule for you to have regular tests and exams.  Things you can do on your own to prevent diseases and keep yourself healthy. What should I know about diet, weight, and exercise? Eat a healthy diet  Eat a diet that includes plenty of vegetables, fruits, low-fat dairy products, and lean protein.  Do not eat a lot of foods that are high in solid fats, added sugars, or sodium.   Maintain a healthy weight Body mass index (BMI) is used to identify weight problems. It estimates body fat based on height and weight. Your health care provider can help determine your BMI and help you achieve or maintain a healthy weight. Get regular exercise Get regular exercise. This is one of the most important things you can do for your health. Most adults should:  Exercise for at least 150 minutes each week. The exercise should increase your heart rate and make you sweat (moderate-intensity exercise).  Do strengthening exercises at least twice a week. This is in addition to the moderate-intensity exercise.  Spend less time sitting. Even light physical activity can be beneficial. Watch cholesterol and blood lipids Have your blood tested for lipids and cholesterol at 68 years of age, then have this test every 5 years. Have your cholesterol levels checked more often if:  Your lipid or cholesterol levels are high.  You are older than 68 years of age.  You are at high risk for heart disease. What should I know about cancer screening? Depending on your health history and family history, you may need to have cancer screening at various ages. This may include screening for:  Breast cancer.  Cervical  cancer.  Colorectal cancer.  Skin cancer.  Lung cancer. What should I know about heart disease, diabetes, and high blood pressure? Blood pressure and heart disease  High blood pressure causes heart disease and increases the risk of stroke. This is more likely to develop in people who have high blood pressure readings, are of African descent, or are overweight.  Have your blood pressure checked: ? Every 3-5 years if you are 918-68 years of age. ? Every year if you are 518 years old or older. Diabetes Have regular diabetes screenings. This checks your fasting blood sugar level. Have the screening done:  Once every three years after age 68 if you are at a normal weight and have a low risk for diabetes.  More often and at a younger age if you are overweight or have a high risk for diabetes. What should I know about preventing infection? Hepatitis B If you have a higher risk for hepatitis B, you should be screened for this virus. Talk with your health care provider to find out if you are at risk for hepatitis B infection. Hepatitis C Testing is recommended for:  Everyone born from [birth year] through 1963.  Anyone with known risk factors for hepatitis C. Sexually transmitted infections (STIs)  Get screened for STIs, including gonorrhea and chlamydia, if: ? You are sexually active and are younger than 68 years of age. ? You are older than 68 years of age and your health care provider tells you that you are at risk for this type of infection. ? Your sexual activity has changed since you were last screened, and you are at increased risk for chlamydia or gonorrhea. Ask your health care provider  if you are at risk.  Ask your health care provider about whether you are at high risk for HIV. Your health care provider may recommend a prescription medicine to help prevent HIV infection. If you choose to take medicine to prevent HIV, you should first get tested for HIV. You should then be tested every 3  months for as long as you are taking the medicine. Pregnancy  If you are about to stop having your period (premenopausal) and you may become pregnant, seek counseling before you get pregnant.  Take 400 to 800 micrograms (mcg) of folic acid every day if you become pregnant.  Ask for birth control (contraception) if you want to prevent pregnancy. Osteoporosis and menopause Osteoporosis is a disease in which the bones lose minerals and strength with aging. This can result in bone fractures. If you are 8 years old or older, or if you are at risk for osteoporosis and fractures, ask your health care provider if you should:  Be screened for bone loss.  Take a calcium or vitamin D supplement to lower your risk of fractures.  Be given hormone replacement therapy (HRT) to treat symptoms of menopause. Follow these instructions at home: Lifestyle  Do not use any products that contain nicotine or tobacco, such as cigarettes, e-cigarettes, and chewing tobacco. If you need help quitting, ask your health care provider.  Do not use street drugs.  Do not share needles.  Ask your health care provider for help if you need support or information about quitting drugs. Alcohol use  Do not drink alcohol if: ? Your health care provider tells you not to drink. ? You are pregnant, may be pregnant, or are planning to become pregnant.  If you drink alcohol: ? Limit how much you use to 0-1 drink a day. ? Limit intake if you are breastfeeding.  Be aware of how much alcohol is in your drink. In the [place] one drink equals one 12 oz bottle of beer (355 mL), one 5 oz glass of wine (148 mL), or one 1 oz glass of hard liquor (44 mL). General instructions  Schedule regular health, dental, and eye exams.  Stay current with your vaccines.  Tell your health care provider if: ? You often feel depressed. ? You have ever been abused or do not feel safe at home. Summary  Adopting a healthy lifestyle and getting  preventive care are important in promoting health and wellness.  Follow your health care provider's instructions about healthy diet, exercising, and getting tested or screened for diseases.  Follow your health care provider's instructions on monitoring your cholesterol and blood pressure. This information is not intended to replace advice given to you by your health care provider. Make sure you discuss any questions you have with your health care provider. Document Revised: 09/05/2018 Document Reviewed: 09/05/2018 Elsevier Patient Education  2021 [employer].         [place] [employer]. Yuleidy Rappleye M.D.

## 2021-01-30 NOTE — Progress Notes (Signed)
Kidney function slightly worse than baseline.  This could be because of fasting  and low hydration state. Cholesterol is also up .intensive well healthy activity and eating as we discussed at the visit to help control the cholesterol.  Consideration of medicine to lower cholesterol but I am sure we both prefer getting it down with lifestyle changes. Rest of labs are normal. Nursing staff please order BMP diagnosis hypertension.  To be done nonfasting well-hydrated in 1 month. We can follow-up the cholesterol in 6 months when you come back.

## 2021-02-01 DIAGNOSIS — D225 Melanocytic nevi of trunk: Secondary | ICD-10-CM | POA: Diagnosis not present

## 2021-02-01 DIAGNOSIS — L578 Other skin changes due to chronic exposure to nonionizing radiation: Secondary | ICD-10-CM | POA: Diagnosis not present

## 2021-02-01 DIAGNOSIS — L648 Other androgenic alopecia: Secondary | ICD-10-CM | POA: Diagnosis not present

## 2021-02-01 DIAGNOSIS — L821 Other seborrheic keratosis: Secondary | ICD-10-CM | POA: Diagnosis not present

## 2021-02-03 DIAGNOSIS — M170 Bilateral primary osteoarthritis of knee: Secondary | ICD-10-CM | POA: Diagnosis not present

## 2021-02-03 NOTE — Addendum Note (Signed)
Addended by: Christy Sartorius on: 02/03/2021 02:24 PM   Modules accepted: Orders

## 2021-02-10 DIAGNOSIS — M8589 Other specified disorders of bone density and structure, multiple sites: Secondary | ICD-10-CM | POA: Diagnosis not present

## 2021-02-10 LAB — HM DEXA SCAN

## 2021-02-12 ENCOUNTER — Telehealth: Payer: Self-pay | Admitting: Internal Medicine

## 2021-02-12 NOTE — Telephone Encounter (Signed)
Pt returned the call to the office. 

## 2021-02-15 NOTE — Telephone Encounter (Signed)
See results note. 

## 2021-02-18 ENCOUNTER — Other Ambulatory Visit: Payer: Self-pay | Admitting: Internal Medicine

## 2021-03-01 ENCOUNTER — Encounter: Payer: Self-pay | Admitting: Internal Medicine

## 2021-03-05 ENCOUNTER — Telehealth: Payer: Self-pay | Admitting: Internal Medicine

## 2021-03-05 NOTE — Telephone Encounter (Signed)
Left message for patient to call back and schedule Medicare Annual Wellness Visit (AWV) either virtually or in office.   Last AWV 05/02/18  please schedule at anytime with [facility] Nurse Health Advisor 1 or 2   This should be a 45 minute visit.

## 2021-03-11 ENCOUNTER — Telehealth: Payer: Self-pay | Admitting: Internal Medicine

## 2021-03-11 NOTE — Telephone Encounter (Signed)
Documented on spreadsheet 

## 2021-03-11 NOTE — Telephone Encounter (Signed)
Pt call and stated she don't want a appt. 

## 2021-03-15 ENCOUNTER — Other Ambulatory Visit (INDEPENDENT_AMBULATORY_CARE_PROVIDER_SITE_OTHER): Payer: Medicare Other

## 2021-03-15 ENCOUNTER — Other Ambulatory Visit: Payer: Self-pay

## 2021-03-15 DIAGNOSIS — I10 Essential (primary) hypertension: Secondary | ICD-10-CM

## 2021-03-15 DIAGNOSIS — Z79899 Other long term (current) drug therapy: Secondary | ICD-10-CM

## 2021-03-15 LAB — BASIC METABOLIC PANEL
BUN: 26 mg/dL — ABNORMAL HIGH (ref 6–23)
CO2: 32 mEq/L (ref 19–32)
Calcium: 9.9 mg/dL (ref 8.4–10.5)
Chloride: 101 mEq/L (ref 96–112)
Creatinine, Ser: 1.19 mg/dL (ref 0.40–1.20)
GFR: 47.15 mL/min — ABNORMAL LOW (ref >60.00)
Glucose, Bld: 70 mg/dL (ref 70–99)
Potassium: 5.3 mEq/L — ABNORMAL HIGH (ref 3.5–5.1)
Sodium: 139 mEq/L (ref 135–145)

## 2021-03-21 NOTE — Progress Notes (Signed)
Kidney function is better  .Potassium now reads borderline high ; (this could be from blood draw artifact which is not important, but  level also can  go up with medication lisinopril  ) be sure to not take any extra potassium supplements  or salt substitutes    Stay hydrated and recheck bmp in 1 months

## 2021-03-22 ENCOUNTER — Other Ambulatory Visit: Payer: Self-pay | Admitting: Internal Medicine

## 2021-03-22 NOTE — Addendum Note (Signed)
Addended by: Christy Sartorius on: 03/22/2021 01:10 PM   Modules accepted: Orders

## 2021-03-24 NOTE — Telephone Encounter (Signed)
Patient would like to have this refilled today   [id number] - [place], [place] - [address] Phone:  [phone]  Fax:  [phone]      Patient would like Mykal to give her a call once the Rx is sent in

## 2021-03-25 NOTE — Telephone Encounter (Signed)
Sent in electronically .  

## 2021-03-30 ENCOUNTER — Other Ambulatory Visit: Payer: Self-pay | Admitting: Internal Medicine

## 2021-03-31 NOTE — Telephone Encounter (Signed)
Sent in electronically .  

## 2021-04-14 DIAGNOSIS — M170 Bilateral primary osteoarthritis of knee: Secondary | ICD-10-CM | POA: Diagnosis not present

## 2021-04-20 ENCOUNTER — Other Ambulatory Visit: Payer: Medicare Other

## 2021-05-17 ENCOUNTER — Other Ambulatory Visit: Payer: Self-pay | Admitting: Internal Medicine

## 2021-05-26 DIAGNOSIS — D692 Other nonthrombocytopenic purpura: Secondary | ICD-10-CM | POA: Diagnosis not present

## 2021-06-08 ENCOUNTER — Telehealth: Payer: Self-pay | Admitting: Internal Medicine

## 2021-06-08 NOTE — Telephone Encounter (Signed)
Left message for patient to call back and schedule Medicare Annual Wellness Visit (AWV) either virtually or in office. Left  my Brooke Pennington number [phone]   Last AWV 05/02/18  please schedule at anytime with [facility] Nurse Health Advisor 1 or 2   This should be a 45 minute visit.

## 2021-06-21 ENCOUNTER — Other Ambulatory Visit: Payer: Self-pay | Admitting: Internal Medicine

## 2021-06-21 NOTE — Telephone Encounter (Signed)
Last refill per controlled substance database: 05/29/21  Last OV: 01/27/21 Next OV: 08/03/21

## 2021-06-23 NOTE — Telephone Encounter (Signed)
Pt informed that rx has been sent.  

## 2021-07-05 DIAGNOSIS — M170 Bilateral primary osteoarthritis of knee: Secondary | ICD-10-CM | POA: Diagnosis not present

## 2021-07-05 DIAGNOSIS — M79605 Pain in left leg: Secondary | ICD-10-CM | POA: Diagnosis not present

## 2021-07-06 DIAGNOSIS — M79605 Pain in left leg: Secondary | ICD-10-CM | POA: Diagnosis not present

## 2021-07-12 ENCOUNTER — Other Ambulatory Visit: Payer: Self-pay | Admitting: Internal Medicine

## 2021-07-14 NOTE — Telephone Encounter (Signed)
Patient called again to follow up on refill for butalbital-acetaminophen-caffeine (FIORICET) 50-325-40 MG tablet Patient is scheduled for 11/8 for OV and would like a refill so she does not run out. Would like a call when prescription is sent    Please send to  [id number] - , [place] - [address] Phone:  [phone]  Fax:  [phone]       Good callback number is [phone]  Please Advise

## 2021-07-15 ENCOUNTER — Other Ambulatory Visit: Payer: Self-pay | Admitting: Internal Medicine

## 2021-07-15 NOTE — Telephone Encounter (Signed)
Pt lost her husband last night and really needs her fioricet. Pt would like a callback

## 2021-07-20 MED ORDER — BUTALBITAL-APAP-CAFFEINE 50-325-40 MG PO TABS: ORAL_TABLET | ORAL | 0 refills | Status: DC

## 2021-07-20 NOTE — Addendum Note (Signed)
Addended by: Nancy Fetter on: 07/20/2021 08:49 AM   Modules accepted: Orders

## 2021-08-02 ENCOUNTER — Other Ambulatory Visit: Payer: Self-pay

## 2021-08-03 ENCOUNTER — Encounter: Payer: Self-pay | Admitting: Internal Medicine

## 2021-08-03 ENCOUNTER — Ambulatory Visit (INDEPENDENT_AMBULATORY_CARE_PROVIDER_SITE_OTHER): Payer: Medicare Other | Admitting: Internal Medicine

## 2021-08-03 VITALS — BP 146/80 | HR 91 | Temp 97.0°F | Ht 62.08 in | Wt 132.4 lb

## 2021-08-03 DIAGNOSIS — Z79899 Other long term (current) drug therapy: Secondary | ICD-10-CM

## 2021-08-03 DIAGNOSIS — N289 Disorder of kidney and ureter, unspecified: Secondary | ICD-10-CM

## 2021-08-03 DIAGNOSIS — I10 Essential (primary) hypertension: Secondary | ICD-10-CM | POA: Diagnosis not present

## 2021-08-03 DIAGNOSIS — F411 Generalized anxiety disorder: Secondary | ICD-10-CM

## 2021-08-03 DIAGNOSIS — Z634 Disappearance and death of family member: Secondary | ICD-10-CM | POA: Diagnosis not present

## 2021-08-03 DIAGNOSIS — R233 Spontaneous ecchymoses: Secondary | ICD-10-CM

## 2021-08-03 LAB — CBC WITH DIFFERENTIAL/PLATELET
Basophils Absolute: 0 10*3/uL (ref 0.0–0.1)
Basophils Relative: 0.5 % (ref 0.0–3.0)
Eosinophils Absolute: 0 10*3/uL (ref 0.0–0.7)
Eosinophils Relative: 0.4 % (ref 0.0–5.0)
HCT: 38.3 % (ref 36.0–46.0)
Hemoglobin: 12.7 g/dL (ref 12.0–15.0)
Lymphocytes Relative: 15 % (ref 12.0–46.0)
Lymphs Abs: 1.2 10*3/uL (ref 0.7–4.0)
MCHC: 33.1 g/dL (ref 30.0–36.0)
MCV: 94.9 fl (ref 78.0–100.0)
Monocytes Absolute: 0.4 10*3/uL (ref 0.1–1.0)
Monocytes Relative: 5.3 % (ref 3.0–12.0)
Neutro Abs: 6.5 10*3/uL (ref 1.4–7.7)
Neutrophils Relative %: 78.8 % — ABNORMAL HIGH (ref 43.0–77.0)
Platelets: 249 10*3/uL (ref 150.0–400.0)
RBC: 4.03 Mil/uL (ref 3.87–5.11)
RDW: 12.9 % (ref 11.5–15.5)
WBC: 8.2 10*3/uL (ref 4.0–10.5)

## 2021-08-03 LAB — BASIC METABOLIC PANEL
BUN: 21 mg/dL (ref 6–23)
CO2: 30 mEq/L (ref 19–32)
Calcium: 9.7 mg/dL (ref 8.4–10.5)
Chloride: 101 mEq/L (ref 96–112)
Creatinine, Ser: 1.44 mg/dL — ABNORMAL HIGH (ref 0.40–1.20)
GFR: 37.4 mL/min — ABNORMAL LOW (ref >60.00)
Glucose, Bld: 94 mg/dL (ref 70–99)
Potassium: 3.6 mEq/L (ref 3.5–5.1)
Sodium: 139 mEq/L (ref 135–145)

## 2021-08-03 MED ORDER — BUTALBITAL-APAP-CAFFEINE 50-325-40 MG PO TABS: ORAL_TABLET | ORAL | 0 refills | Status: DC

## 2021-08-03 MED ORDER — ALPRAZOLAM 1 MG PO TABS: ORAL_TABLET | ORAL | 2 refills | Status: DC

## 2021-08-03 NOTE — Progress Notes (Signed)
Chief Complaint  Patient presents with   Follow-up     HPI: Brooke Pennington 70 y.o. come in for Chronic disease management  med check   Her husband unexpectedly passed away after uncomplicated surgery for tongue cancer and was advancing diet doing well when she got a phone call that he had suddenly passed away.  This was the middle of October Has friends helping and support not eating much not hungry trying to stay hydrated realizes not making any important decisions repeat soon.  Marriage was of 13+ years. BP   taking  expected.  meds   Asks if she can have 20 or more of the aorta set during this time as her headaches are worse as she is would take great care of taking medicine. Taking the Xanax as always.  And the buspirone. Has noticed intermittent bruising on the upper thighs no specific trauma though she does lean on that area but no bruising or bleeding on the trunk or other areas.  Is not taking aspirin ANxiety and meds  : ROS: See pertinent positives and negatives per HPI.  Past Medical History:  Diagnosis Date   Allergy    Anal fissure    hx of same   Dependence, barbiturates ? 05/01/2013   Have come to the conclusion that she is dependent on this medication based on her history and the fact that she feels better when she takes it    Eczema    H/O rectal polypectomy    ?  uncertain  if colonic or rectal colonsocopy 8 2007 [facility] [place]   Headache(784.0)    Hypertension    Lymphocytic gastritis    neg SBBX for celiac  in 8 2007    Macrocytosis 11/29/2011   Borderline  Check b12 folate at next visit    Medication management failed tox screen 11/27/2013   Neg for xanax and butalbital on urine screen . 2 /15    Menopause    age 62 with sx no hrt   Mild anemia 02/28/2014   PMR (polymyalgia rheumatica) (HCC)    Possible diagnoses by primary care physician with her anemia and elevated sedimentation rate of 60 was on prednisone but stopped it herself and then felt fine     Family History  Problem Relation Age of Onset   Coronary artery disease Father    Heart disease Father    COPD Mother    Anemia Sister        needing transfusion some> vit b12 ?    Social History   Socioeconomic History   Marital status: Married    Spouse name: Not on file   Number of children: Not on file   Years of education: Not on file   Highest education level: Not on file  Occupational History   Not on file  Tobacco Use   Smoking status: Never   Smokeless tobacco: Never   Tobacco comments:    never used tobacco  Vaping Use   Vaping Use: Never used  Substance and Sexual Activity   Alcohol use: Not Currently    Alcohol/week: 1.0 standard drink    Types: 1 Glasses of wine per week    Comment: ocassionally   Drug use: No   Sexual activity: Yes  Other Topics Concern   Not on file  Social History Narrative   Married remarried   he is self-employed   hhof 2    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  Has smoke detector and wears seat belts.  No firearms. No excess sun exposure. Sees dentist regularly . No depression      Only 1 car at this time   No etoh   Father passed dec 23 15 chf heart related age 53      Social Determinants of Health   Financial Resource Strain: Not on file  Food Insecurity: Not on file  Transportation Needs: Not on file  Physical Activity: Not on file  Stress: Not on file  Social Connections: Not on file    Outpatient Medications Prior to Visit  Medication Sig Dispense Refill   busPIRone (BUSPAR) 10 MG tablet TAKE 1 AND 1/2 TABLETS BY MOUTH 3 TIMES DAILY 405 tablet 1   finasteride (PROSCAR) 5 MG tablet Take 5 mg by mouth daily.     hydroquinone 4 % cream Apply topically 2 (two) times daily.     latanoprost (XALATAN) 0.005 % ophthalmic solution Place 1 drop into both eyes at bedtime.   3   LATISSE 0.03 % ophthalmic solution APPLY 1 DROP AT THE BASE OF THE UPPER EYELASHES EVERY DAY     lisinopril  (ZESTRIL) 10 MG tablet TAKE 1 TABLET BY MOUTH EVERY DAY**NEEDS OFFICE VISIT** 90 tablet 1   MULTIPLE VITAMIN PO Take 1 tablet by mouth daily.     tretinoin (RETIN-A) 0.1 % cream Apply 1 application topically at bedtime.   3   triamcinolone cream (KENALOG) 0.1 %      ALPRAZolam (XANAX) 1 MG tablet TAKE 1 TABLET BY MOUTH FOUR TIMES A DAY AS NEEDED 120 tablet 1   butalbital-acetaminophen-caffeine (FIORICET) 50-325-40 MG tablet TAKE 1 TO 2 TABLETS BY MOUTH AS NEEDED AS DIRECTED 10 tablet 0   No facility-administered medications prior to visit.     EXAM:  BP (!) 146/80 (BP Location: Left Arm, Patient Position: Sitting, Cuff Size: Normal)   Pulse 91   Temp (!) 97 F (36.1 C) (Temporal)   Ht 5' 2.08" (1.577 m)   Wt 132 lb 6.4 oz (60.1 kg)   SpO2 95%   BMI 24.16 kg/m   Body mass index is 24.16 kg/m.  GENERAL: vitals reviewed and listed above, alert, oriented, appears well hydrated and in no acute distress HEENT: atraumatic, conjunctiva  clear, no obvious abnormalities on inspection of external nose and ears OP : Masked NECK: no obvious masses on inspection palpation  LUNGS: clear to auscultation bilaterally, no wheezes, rales or rhonchi, good air movement CV: HRRR, no clubbing cyanosis or  peripheral edema nl cap refill  Abdomen:  Sof,t normal bowel sounds without hepatosplenomegaly, no guarding rebound or masses no CVA tenderness Anterior thighs with multiple small superficial bruise  none on lower legs  no petechia  and trunk is clear  MS: moves all extremities without noticeable focal  abnormality PSYCH: pleasant and cooperative, down affect as expected   Lab Results  Component Value Date   WBC 8.2 08/03/2021   HGB 12.7 08/03/2021   HCT 38.3 08/03/2021   PLT 249.0 08/03/2021   GLUCOSE 94 08/03/2021   CHOL 278 (H) 01/27/2021   TRIG 129.0 01/27/2021   HDL 66.50 01/27/2021   LDLDIRECT 168.2 12/25/2012   LDLCALC 186 (H) 01/27/2021   ALT 13 01/27/2021   AST 16 01/27/2021   NA  139 08/03/2021   K 3.6 08/03/2021   CL 101 08/03/2021   CREATININE 1.44 (H) 08/03/2021   BUN 21 08/03/2021   CO2 30  08/03/2021   TSH 4.24 01/27/2021   HGBA1C 5.5 01/27/2021   BP Readings from Last 3 Encounters:  08/03/21 (!) 146/80  01/27/21 136/78  08/06/19 132/74    ASSESSMENT AND PLAN:  Discussed the following assessment and plan:  Medication management - Plan: Basic Metabolic Panel, CBC with Differential/Platelet, CBC with Differential/Platelet, Basic Metabolic Panel  Recent bereavement - unexpected death of spouse post op 2023/08/07- Plan: Basic Metabolic Panel, CBC with Differential/Platelet, CBC with Differential/Platelet, Basic Metabolic Panel  Essential hypertension - Plan: Basic Metabolic Panel, CBC with Differential/Platelet, CBC with Differential/Platelet, Basic Metabolic Panel  Anxiety state - Plan: Basic Metabolic Panel, CBC with Differential/Platelet, CBC with Differential/Platelet, Basic Metabolic Panel  Renal insufficiency - Plan: Basic Metabolic Panel, CBC with Differential/Platelet, CBC with Differential/Platelet, Basic Metabolic Panel  Long term prescription benzodiazepine use - Plan: Basic Metabolic Panel, CBC with Differential/Platelet, CBC with Differential/Platelet, Basic Metabolic Panel  Easy bruising - Plan: Basic Metabolic Panel, CBC with Differential/Platelet, CBC with Differential/Platelet, Basic Metabolic Panel Exam reassuring about bruising although unusual place perhaps the buspirone could give her a little bit a easy bruising with trauma on the upper thighs no other evidence of problem. She is quite devastated with the sudden death of recently of her spouse and sounds like she is making good choices Is requesting extra butalbital on discussed her with caution she used to take a lot more we will give her 20 at this time caution. Plan med check on video / telephone in about 3 months.  Pending on lab tests. -Patient advised to return or notify health  care team  if  new concerns arise. Review counsel and plan 45 minutes  Patient Instructions  Condolences  on loss of your husband  Sudden death  in addition.   Caution with medications. Lab today .  Get outside  once a day.  Use your support .  Plan follow up  3 mos video or telephone      Neta Mends. Gabbrielle Mcnicholas M.D.

## 2021-08-03 NOTE — Patient Instructions (Addendum)
Condolences  on loss of your husband  Sudden death  in addition.   Caution with medications. Lab today .  Get outside  once a day.  Use your support .  Plan follow up  3 mos video or telephone

## 2021-08-04 NOTE — Progress Notes (Signed)
No anemia stable Kidney function is a li bit worse from last time. Continue to work on staying hydrated Recheck BMP in about 1 month.  Does not have to be fasting.

## 2021-08-06 NOTE — Addendum Note (Signed)
Addended by: Christy Sartorius on: 08/06/2021 02:26 PM   Modules accepted: Orders

## 2021-08-17 ENCOUNTER — Other Ambulatory Visit: Payer: Self-pay | Admitting: Internal Medicine

## 2021-08-26 NOTE — Progress Notes (Signed)
So lets see what the repeat is this month.  And if we need advice we can get a kidney specialist to look at your numbers and see if there is other interventions we need to do.

## 2021-09-06 DIAGNOSIS — Z01419 Encounter for gynecological examination (general) (routine) without abnormal findings: Secondary | ICD-10-CM | POA: Diagnosis not present

## 2021-09-06 DIAGNOSIS — Z1231 Encounter for screening mammogram for malignant neoplasm of breast: Secondary | ICD-10-CM | POA: Diagnosis not present

## 2021-09-14 LAB — HM MAMMOGRAPHY

## 2021-09-15 ENCOUNTER — Encounter: Payer: Self-pay | Admitting: Internal Medicine

## 2021-09-22 ENCOUNTER — Other Ambulatory Visit: Payer: Self-pay | Admitting: Internal Medicine

## 2021-09-24 NOTE — Telephone Encounter (Signed)
Rx  10

## 2021-09-29 ENCOUNTER — Other Ambulatory Visit: Payer: Medicare Other

## 2021-09-30 ENCOUNTER — Telehealth: Payer: Self-pay | Admitting: Internal Medicine

## 2021-09-30 NOTE — Telephone Encounter (Signed)
Left message for patient to call back and schedule Medicare Annual Wellness Visit (AWV) either virtually or in office. Left  my Brooke Pennington number [phone]   Last AWVi 05/02/18 please schedule at anytime with [facility] Nurse Health Advisor 1 or 2   This should be a 45 minute visit.

## 2021-10-13 DIAGNOSIS — M170 Bilateral primary osteoarthritis of knee: Secondary | ICD-10-CM | POA: Diagnosis not present

## 2021-10-18 ENCOUNTER — Other Ambulatory Visit: Payer: Self-pay | Admitting: Internal Medicine

## 2021-10-19 ENCOUNTER — Telehealth: Payer: Self-pay | Admitting: Internal Medicine

## 2021-10-19 MED ORDER — BUTALBITAL-APAP-CAFFEINE 50-325-40 MG PO TABS: ORAL_TABLET | ORAL | 0 refills | Status: DC

## 2021-10-19 NOTE — Telephone Encounter (Signed)
Last Ov 08/03/21 Upcoming appt 11/03/21

## 2021-10-19 NOTE — Addendum Note (Signed)
Addended byBerniece Andreas K on: 10/19/2021 05:02 PM   Modules accepted: Orders

## 2021-10-19 NOTE — Telephone Encounter (Signed)
Sent in electronically .  

## 2021-10-19 NOTE — Telephone Encounter (Signed)
[facility] butalbital-acetaminophen-caffeine (FIORICET) 50-325-40 MG tablet [[id number]]  to be refilled. The request was sent in yesterday (10/18/2021) at 10:23am.  Please advise.

## 2021-11-01 ENCOUNTER — Other Ambulatory Visit: Payer: Self-pay | Admitting: Internal Medicine

## 2021-11-03 ENCOUNTER — Ambulatory Visit (INDEPENDENT_AMBULATORY_CARE_PROVIDER_SITE_OTHER): Payer: Medicare Other | Admitting: Internal Medicine

## 2021-11-03 ENCOUNTER — Other Ambulatory Visit: Payer: Self-pay

## 2021-11-03 ENCOUNTER — Encounter: Payer: Self-pay | Admitting: Internal Medicine

## 2021-11-03 DIAGNOSIS — Z79899 Other long term (current) drug therapy: Secondary | ICD-10-CM

## 2021-11-03 DIAGNOSIS — Z634 Disappearance and death of family member: Secondary | ICD-10-CM

## 2021-11-03 DIAGNOSIS — E785 Hyperlipidemia, unspecified: Secondary | ICD-10-CM

## 2021-11-03 DIAGNOSIS — I10 Essential (primary) hypertension: Secondary | ICD-10-CM | POA: Diagnosis not present

## 2021-11-03 DIAGNOSIS — F411 Generalized anxiety disorder: Secondary | ICD-10-CM

## 2021-11-03 DIAGNOSIS — N289 Disorder of kidney and ureter, unspecified: Secondary | ICD-10-CM

## 2021-11-03 MED ORDER — BUTALBITAL-APAP-CAFFEINE 50-325-40 MG PO TABS: ORAL_TABLET | ORAL | 0 refills | Status: DC

## 2021-11-03 NOTE — Progress Notes (Signed)
° °  Virtual Visit via Telephone Note  I connected with Brooke Pennington  on 11/21/21 at  2:30 PM EST by telephone and verified that I am speaking with the correct person using two identifiers.   I discussed the limitations, risks, security and privacy concerns of performing an evaluation and management service by telephone and the limited availability of in person appointments. tThere may be a patient responsible charge related to this service. The patient expressed understanding and agreed to proceed.  Location patient: home Location provider: work office Participants present for the call: patient, provider Patient did not have a visit in the prior 7 days to address this/these issue(s).   History of Present Illness: Brooke Pennington   fu of Brooke Pennington in today for follow up of  multiple medical problems.  Med checks .  Still struggling  in loss of husband. Many obligations  but taking bp medications.   Asks for early refill fioricet  neg tad  has support .  Less activity . No cv resp sx.  No bleeding   Observations/Objective: Patient sounds cognition intact  nl speech.  I do not appreciate any SOB. Speech and thought processing are grossly intact. Patient reported vitals:doesn't take her own Bp ( cause increases) Lab Results  Component Value Date   WBC 8.2 08/03/2021   HGB 12.7 08/03/2021   HCT 38.3 08/03/2021   PLT 249.0 08/03/2021   GLUCOSE 94 08/03/2021   CHOL 278 (H) 01/27/2021   TRIG 129.0 01/27/2021   HDL 66.50 01/27/2021   LDLDIRECT 168.2 12/25/2012   LDLCALC 186 (H) 01/27/2021   ALT 13 01/27/2021   AST 16 01/27/2021   NA 139 08/03/2021   K 3.6 08/03/2021   CL 101 08/03/2021   CREATININE 1.44 (H) 08/03/2021   BUN 21 08/03/2021   CO2 30 08/03/2021   TSH 4.24 01/27/2021   HGBA1C 5.5 01/27/2021    Assessment and Plan: Medication management - Plan: Basic metabolic panel, Hepatic function panel, Lipid panel, TSH  Essential hypertension - Plan: Basic  metabolic panel, Hepatic function panel, Lipid panel, TSH  Renal insufficiency - Plan: Basic metabolic panel, Hepatic function panel, Lipid panel, TSH  Hyperlipidemia, unspecified hyperlipidemia type - Plan: Basic metabolic panel, Hepatic function panel, Lipid panel, TSH  Anxiety state - Plan: Basic metabolic panel, Hepatic function panel, Lipid panel, TSH  Recent bereavement  Death of husband  Counseled reviewed plan  below .  Follow Up Instructions:   Caution with meds   will refill  fioricet early for now . Labs hydrated but prefer fasting  .  Was reported nl in  range BP Continued grieving  coping  ( has lost weight but has support and  getting back to some activity)   99441 5-10 99442 11-20 94443 21-30 I did not refer this patient for an OV in the next 24 hours for this/these issue(s).  I discussed the assessment and treatment plan with the patient. The patient was provided an opportunity to ask questions and answered. The patient agreed with the plan and demonstrated an understanding of the instructions.   The patient was advised to call back or seek an in-person evaluation if the symptoms worsen or if the condition fails to improve as anticipated.  I provided 24 minutes of non-face-to-face time during this encounter. Return for fasting lab    then visi cpx as possible in summer .  Berniece Andreas, MD

## 2021-11-29 ENCOUNTER — Telehealth: Payer: Self-pay | Admitting: Internal Medicine

## 2021-12-01 ENCOUNTER — Other Ambulatory Visit: Payer: Self-pay | Admitting: Internal Medicine

## 2021-12-01 ENCOUNTER — Other Ambulatory Visit: Payer: Self-pay

## 2021-12-01 NOTE — Telephone Encounter (Signed)
The rx butalbital-acetaminophen-caffeine (FIORICET) 50-325-40 MG tablet was printed and pharm did not received please resend to  ?[id number] [place] [place] - [address] Phone:  [phone]  ?Fax:  [phone]  ?  ? ?

## 2021-12-01 NOTE — Telephone Encounter (Signed)
Last Ov 11/03/21 ?Filled 11/03/21 ?Is it ok to refill? ?

## 2021-12-02 ENCOUNTER — Other Ambulatory Visit: Payer: Self-pay | Admitting: Internal Medicine

## 2021-12-02 ENCOUNTER — Telehealth: Payer: Self-pay | Admitting: Internal Medicine

## 2021-12-02 NOTE — Telephone Encounter (Signed)
Spoke with patient to schedule Medicare Annual Wellness Visit (AWV) either virtually or in office.  ? ?Patient declined stating she only wants to see PCP ? ?Last AWV 05/02/18 ?please schedule at anytime with [facility] Nurse Health Advisor 1 or 2 ? ? ?This should be a 45 minute visit.  ?

## 2021-12-02 NOTE — Telephone Encounter (Signed)
Patient is requesting a phone call regarding this medication. ? ?Please advise. ?

## 2021-12-03 ENCOUNTER — Telehealth: Payer: Self-pay | Admitting: Internal Medicine

## 2021-12-03 ENCOUNTER — Other Ambulatory Visit: Payer: Self-pay | Admitting: Internal Medicine

## 2021-12-03 NOTE — Telephone Encounter (Signed)
Pharmacy called in stating that they never received approval for butalbital-acetaminophen-caffeine (FIORICET) 50-325-40 MG tablet [[id number]]  although Dr.Panosh did send a refill to the pharmacy on our end. ? ?Please advise. ?

## 2021-12-03 NOTE — Telephone Encounter (Signed)
Error with Rx can you resend  ?

## 2021-12-05 MED ORDER — BUTALBITAL-APAP-CAFFEINE 50-325-40 MG PO TABS: ORAL_TABLET | ORAL | 1 refills | Status: DC

## 2021-12-22 DIAGNOSIS — M170 Bilateral primary osteoarthritis of knee: Secondary | ICD-10-CM | POA: Diagnosis not present

## 2021-12-28 ENCOUNTER — Other Ambulatory Visit: Payer: Self-pay | Admitting: Internal Medicine

## 2022-01-03 DIAGNOSIS — N840 Polyp of corpus uteri: Secondary | ICD-10-CM | POA: Diagnosis not present

## 2022-01-03 DIAGNOSIS — N950 Postmenopausal bleeding: Secondary | ICD-10-CM | POA: Diagnosis not present

## 2022-01-12 DIAGNOSIS — M170 Bilateral primary osteoarthritis of knee: Secondary | ICD-10-CM | POA: Diagnosis not present

## 2022-01-20 ENCOUNTER — Telehealth: Payer: Self-pay | Admitting: Internal Medicine

## 2022-01-20 ENCOUNTER — Other Ambulatory Visit: Payer: Self-pay | Admitting: Internal Medicine

## 2022-01-20 NOTE — Telephone Encounter (Signed)
Pt is calling and [facility] not seen received ALPRAZolam (XANAX) 1 MG tablet  ?[facility] [place] [place] - [address] Phone:  [phone]  ?Fax:  [phone]  ?  ? ?

## 2022-01-21 ENCOUNTER — Other Ambulatory Visit: Payer: Self-pay | Admitting: Internal Medicine

## 2022-01-21 NOTE — Telephone Encounter (Signed)
Duplicate request

## 2022-01-21 NOTE — Telephone Encounter (Signed)
Last Ov 11/03/21 ?Filled 11/02/21 ?Is it ok to refill? ?

## 2022-01-21 NOTE — Telephone Encounter (Signed)
Request sent to PCP

## 2022-01-23 MED ORDER — ALPRAZOLAM 1 MG PO TABS: ORAL_TABLET | ORAL | 2 refills | Status: DC

## 2022-01-23 NOTE — Addendum Note (Signed)
Addended byBerniece Andreas K on: 01/23/2022 09:03 PM ? ? Modules accepted: Orders ? ?

## 2022-01-23 NOTE — Telephone Encounter (Signed)
Sent in electronically .  

## 2022-01-24 NOTE — Telephone Encounter (Signed)
[facility] but no answer. Looks like this was sent in 01/21/2022.  ?

## 2022-01-24 NOTE — Telephone Encounter (Signed)
Pt is calling and [facility] not [facility] butalbital-acetaminophen-caffeine (FIORICET) 50-325-40 MG tablet ?

## 2022-01-25 ENCOUNTER — Other Ambulatory Visit: Payer: Self-pay | Admitting: Internal Medicine

## 2022-01-25 NOTE — Telephone Encounter (Signed)
Please re-send Rx.

## 2022-01-25 NOTE — Telephone Encounter (Signed)
Pt called stating that [facility] didn't receive the refill Rx for Fioricet because their system was down yesterday and need Dr. Fabian Sharp to resend the order.  ? ?Please advise.  ?

## 2022-01-26 ENCOUNTER — Other Ambulatory Visit: Payer: Self-pay | Admitting: Internal Medicine

## 2022-01-26 NOTE — Telephone Encounter (Signed)
Pt calling in checking on this refill to see if it's been sent. [facility] states they have not received it ?

## 2022-01-27 MED ORDER — BUTALBITAL-APAP-CAFFEINE 50-325-40 MG PO TABS: ORAL_TABLET | ORAL | 1 refills | Status: DC

## 2022-01-31 DIAGNOSIS — H25813 Combined forms of age-related cataract, bilateral: Secondary | ICD-10-CM | POA: Diagnosis not present

## 2022-01-31 DIAGNOSIS — H401131 Primary open-angle glaucoma, bilateral, mild stage: Secondary | ICD-10-CM | POA: Diagnosis not present

## 2022-02-18 ENCOUNTER — Other Ambulatory Visit: Payer: Self-pay | Admitting: Internal Medicine

## 2022-02-18 NOTE — Telephone Encounter (Signed)
Last CPE 01/2021.  LVM instructions for pt to call office to schedule.

## 2022-03-09 DIAGNOSIS — D225 Melanocytic nevi of trunk: Secondary | ICD-10-CM | POA: Diagnosis not present

## 2022-03-09 DIAGNOSIS — D692 Other nonthrombocytopenic purpura: Secondary | ICD-10-CM | POA: Diagnosis not present

## 2022-03-09 DIAGNOSIS — L650 Telogen effluvium: Secondary | ICD-10-CM | POA: Diagnosis not present

## 2022-03-09 DIAGNOSIS — L821 Other seborrheic keratosis: Secondary | ICD-10-CM | POA: Diagnosis not present

## 2022-03-14 ENCOUNTER — Other Ambulatory Visit: Payer: Self-pay | Admitting: Internal Medicine

## 2022-03-14 NOTE — Telephone Encounter (Signed)
Last Ov 11/03/21 Filled 01/27/22 Is it ok to refill?

## 2022-03-19 ENCOUNTER — Other Ambulatory Visit: Payer: Self-pay | Admitting: Internal Medicine

## 2022-04-12 ENCOUNTER — Telehealth: Payer: Self-pay | Admitting: Internal Medicine

## 2022-04-12 NOTE — Telephone Encounter (Signed)
Last Ov 11/03/21 Filed 01/23/21  ALPRAZolam Prudy Feeler)  03/18/22 butalbital-acetaminophen-caffeine (FIORICET) Is it ok to refill?

## 2022-04-14 NOTE — Telephone Encounter (Signed)
Last Ov 11/23/21 Filled 01/23/22 Is it ok to refill?

## 2022-04-14 NOTE — Telephone Encounter (Signed)
I cant cancel  the firoicet order  and order the alprazolam  please   deny the fioricet and then send me the alprazolam to order

## 2022-04-14 NOTE — Telephone Encounter (Signed)
Pt called to figure out why her Fioricet was not filled; I let her knw per Dr. Note that it wasn't time for a refill. Pt stated if Dr can make it an exception for her because her Husband passed last October and she is still having a tough time.She is having a lot of headaches.   Pt also stated that she will call us back to sched her cpe for Aug sometime.   Please advise.

## 2022-04-14 NOTE — Telephone Encounter (Signed)
Too early for the fioricet   had a refill on this med and rx 6 23 with one refill . Will refill the alprazolam  She is due forcpx and labs   ( 6 months in august ) Please  have her make cpx appt and will do labs  as indicated then.

## 2022-04-15 ENCOUNTER — Other Ambulatory Visit: Payer: Self-pay | Admitting: Internal Medicine

## 2022-04-15 NOTE — Telephone Encounter (Signed)
Pt states she has already picked up the refill of Fioricet & is wanted an exception to pick up more since she continues to have h/a. Pt advised the MD was aware of her request for an exception & has said she cannot order more. Pt would like to know when she can have another refill of Fioricet.

## 2022-04-15 NOTE — Telephone Encounter (Addendum)
Pt is calling  and would like a refill on fioricet due to headaches  [id number] [place], [place] - [address] Phone:  [phone]  Fax:  [phone]

## 2022-04-15 NOTE — Telephone Encounter (Signed)
LVM instructions for pt to call back to get clarification on the medication she is requesting.   If pt calls back please inform her that Xanax was sent INSTEAD of [facility]et [facility] [place]. The pharmacy received the order 04/14/22 at 1115.

## 2022-04-18 DIAGNOSIS — N840 Polyp of corpus uteri: Secondary | ICD-10-CM | POA: Diagnosis not present

## 2022-04-18 DIAGNOSIS — N950 Postmenopausal bleeding: Secondary | ICD-10-CM | POA: Diagnosis not present

## 2022-04-26 ENCOUNTER — Other Ambulatory Visit: Payer: Self-pay | Admitting: Internal Medicine

## 2022-04-28 NOTE — Telephone Encounter (Signed)
Pt is aware of the below message concerning fioricet

## 2022-04-29 NOTE — Telephone Encounter (Signed)
Last Office visit- 11/03/2021 Last refill-03/18/22--10 tabs, 1 refill  No future OV scheduled.

## 2022-05-11 DIAGNOSIS — M170 Bilateral primary osteoarthritis of knee: Secondary | ICD-10-CM | POA: Diagnosis not present

## 2022-05-12 ENCOUNTER — Other Ambulatory Visit: Payer: Self-pay | Admitting: Internal Medicine

## 2022-05-31 ENCOUNTER — Other Ambulatory Visit: Payer: Self-pay | Admitting: Internal Medicine

## 2022-05-31 ENCOUNTER — Telehealth: Payer: Self-pay | Admitting: Internal Medicine

## 2022-05-31 NOTE — Telephone Encounter (Signed)
Pt called to request a refill of the following:  LOV:  11/03/21  Pt stated she will come in for an OV if MD requires it, but states she only has one pill left.  butalbital-acetaminophen-caffeine (FIORICET) 50-325-40 MG tablet  Please advise.  [id number] [place], [place] - [address] Phone:  [phone]  Fax:  [phone]

## 2022-06-01 NOTE — Telephone Encounter (Signed)
I refilled  today. Have her make a visit sometime this fall .

## 2022-06-02 NOTE — Telephone Encounter (Signed)
Contacted pt. Left voicemail to call us back.

## 2022-06-08 NOTE — Telephone Encounter (Signed)
Contacted pt. Left a voicemail to call us back.

## 2022-06-25 ENCOUNTER — Other Ambulatory Visit: Payer: Self-pay | Admitting: Internal Medicine

## 2022-06-28 ENCOUNTER — Other Ambulatory Visit: Payer: Self-pay | Admitting: Internal Medicine

## 2022-06-28 NOTE — Telephone Encounter (Signed)
Spoke to patient. Sent in Buspar to pharmacy. Forwarded other two meds to provider to approved due to pcp is out of office. Patient is aware. Patient requests a call back after getting a response from a provider.

## 2022-06-28 NOTE — Telephone Encounter (Addendum)
Pt called, returning CMA's call.  CMA was unavailable.  Please call her back when you can.   Pt was following up on her request for the refills of the following:  Pt states Pharmacy told her they have been requesting MD approval since 06/25/22 for the  busPIRone (BUSPAR) 10 MG tablet,   butalbital-acetaminophen-caffeine (FIORICET) 50-325-40 MG tablet  Pt is completely out of these medications.  Also, ALPRAZolam (XANAX) 1 MG tablet will be due soon, as well.  Pt is aware MD will be out until 07/06/22. Pt is very worried she will miss too many days while MD is out,  and is asking if anyone else can please refill these prescriptions for her?  Pt has been scheduled for a CPE on 09/13/22.  [facility] [place], [place] Phone:  [phone]  Fax:  [phone]

## 2022-06-29 DIAGNOSIS — M170 Bilateral primary osteoarthritis of knee: Secondary | ICD-10-CM | POA: Diagnosis not present

## 2022-07-08 NOTE — Telephone Encounter (Signed)
Contacted patient and left a voicemail that Buspar was sent in. If she has any question to give [place] a call back.

## 2022-07-14 ENCOUNTER — Telehealth: Payer: Self-pay | Admitting: *Deleted

## 2022-07-14 NOTE — Patient Outreach (Signed)
  Care Coordination   07/14/2022 Name: Brooke Pennington MRN: [id number] DOB: [birth year]   Care Coordination Outreach Attempts:  An unsuccessful telephone outreach was attempted today to offer the patient information about available care coordination services as a benefit of their health plan.   Follow Up Plan:  Additional outreach attempts will be made to offer the patient care coordination information and services.   Encounter Outcome:  No Answer  Care Coordination Interventions Activated:  No   Care Coordination Interventions:  No, not indicated    Raina Mina, RN Care Management Coordinator [place] Office [phone]

## 2022-07-20 ENCOUNTER — Telehealth: Payer: Self-pay | Admitting: *Deleted

## 2022-07-20 NOTE — Patient Outreach (Signed)
  Care Coordination   07/20/2022 Name: Brooke Pennington MRN: [id number] DOB: 07-Aug-1951   Care Coordination Outreach Attempts:  A second unsuccessful outreach was attempted today to offer the patient with information about available care coordination services as a benefit of their health plan.     Follow Up Plan:  Additional outreach attempts will be made to offer the patient care coordination information and services.   Encounter Outcome:  No Answer  Care Coordination Interventions Activated:  No   Care Coordination Interventions:  No, not indicated     Raina Mina, RN Care Management Coordinator [place] Office [phone]

## 2022-07-26 ENCOUNTER — Other Ambulatory Visit: Payer: Self-pay | Admitting: Internal Medicine

## 2022-08-11 ENCOUNTER — Other Ambulatory Visit: Payer: Self-pay | Admitting: Internal Medicine

## 2022-08-16 ENCOUNTER — Other Ambulatory Visit: Payer: Self-pay | Admitting: Internal Medicine

## 2022-08-22 ENCOUNTER — Other Ambulatory Visit: Payer: Self-pay | Admitting: Internal Medicine

## 2022-08-31 DIAGNOSIS — M170 Bilateral primary osteoarthritis of knee: Secondary | ICD-10-CM | POA: Diagnosis not present

## 2022-09-08 ENCOUNTER — Other Ambulatory Visit: Payer: Self-pay | Admitting: Internal Medicine

## 2022-09-09 NOTE — Telephone Encounter (Signed)
Dr. Rosezella [place] patient.  Last OV-11/03/21 Last refill Xanax-08/23/22--120 tabs, 0 refill Last refill Fioricet-08/20/22-10 tabs, 0 refill  Next OV- 11/03/22

## 2022-09-13 ENCOUNTER — Encounter: Payer: Medicare Other | Admitting: Internal Medicine

## 2022-10-04 ENCOUNTER — Other Ambulatory Visit: Payer: Self-pay | Admitting: Family

## 2022-10-06 NOTE — Telephone Encounter (Signed)
Pt is calling checking on the status of refill. Pt would like a callback she is out of med

## 2022-10-21 ENCOUNTER — Other Ambulatory Visit: Payer: Self-pay | Admitting: Internal Medicine

## 2022-11-03 ENCOUNTER — Encounter: Payer: Self-pay | Admitting: Internal Medicine

## 2022-11-03 ENCOUNTER — Telehealth: Payer: Self-pay | Admitting: Internal Medicine

## 2022-11-03 ENCOUNTER — Ambulatory Visit (INDEPENDENT_AMBULATORY_CARE_PROVIDER_SITE_OTHER): Payer: Medicare Other | Admitting: Internal Medicine

## 2022-11-03 VITALS — BP 120/70 | HR 96 | Temp 98.0°F | Ht 62.8 in | Wt 116.4 lb

## 2022-11-03 DIAGNOSIS — Z0000 Encounter for general adult medical examination without abnormal findings: Secondary | ICD-10-CM

## 2022-11-03 DIAGNOSIS — N289 Disorder of kidney and ureter, unspecified: Secondary | ICD-10-CM

## 2022-11-03 DIAGNOSIS — R634 Abnormal weight loss: Secondary | ICD-10-CM

## 2022-11-03 DIAGNOSIS — I10 Essential (primary) hypertension: Secondary | ICD-10-CM | POA: Diagnosis not present

## 2022-11-03 DIAGNOSIS — Z79899 Other long term (current) drug therapy: Secondary | ICD-10-CM

## 2022-11-03 DIAGNOSIS — E785 Hyperlipidemia, unspecified: Secondary | ICD-10-CM | POA: Diagnosis not present

## 2022-11-03 DIAGNOSIS — R233 Spontaneous ecchymoses: Secondary | ICD-10-CM

## 2022-11-03 DIAGNOSIS — N1831 Chronic kidney disease, stage 3a: Secondary | ICD-10-CM | POA: Diagnosis not present

## 2022-11-03 DIAGNOSIS — E559 Vitamin D deficiency, unspecified: Secondary | ICD-10-CM | POA: Diagnosis not present

## 2022-11-03 DIAGNOSIS — F411 Generalized anxiety disorder: Secondary | ICD-10-CM

## 2022-11-03 DIAGNOSIS — R739 Hyperglycemia, unspecified: Secondary | ICD-10-CM

## 2022-11-03 DIAGNOSIS — Z634 Disappearance and death of family member: Secondary | ICD-10-CM

## 2022-11-03 LAB — CBC WITH DIFFERENTIAL/PLATELET
Basophils Absolute: 0 10*3/uL (ref 0.0–0.1)
Basophils Relative: 0.6 % (ref 0.0–3.0)
Eosinophils Absolute: 0 10*3/uL (ref 0.0–0.7)
Eosinophils Relative: 0.6 % (ref 0.0–5.0)
HCT: 31.2 % — ABNORMAL LOW (ref 36.0–46.0)
Hemoglobin: 10.5 g/dL — ABNORMAL LOW (ref 12.0–15.0)
Lymphocytes Relative: 23.3 % (ref 12.0–46.0)
Lymphs Abs: 1.7 10*3/uL (ref 0.7–4.0)
MCHC: 33.7 g/dL (ref 30.0–36.0)
MCV: 96.2 fl (ref 78.0–100.0)
Monocytes Absolute: 0.4 10*3/uL (ref 0.1–1.0)
Monocytes Relative: 5.4 % (ref 3.0–12.0)
Neutro Abs: 5 10*3/uL (ref 1.4–7.7)
Neutrophils Relative %: 70.1 % (ref 43.0–77.0)
Platelets: 294 10*3/uL (ref 150.0–400.0)
RBC: 3.24 Mil/uL — ABNORMAL LOW (ref 3.87–5.11)
RDW: 13.3 % (ref 11.5–15.5)
WBC: 7.1 10*3/uL (ref 4.0–10.5)

## 2022-11-03 LAB — HEMOGLOBIN A1C: Hgb A1c MFr Bld: 5.5 % (ref 4.6–6.5)

## 2022-11-03 LAB — LIPID PANEL
Cholesterol: 237 mg/dL — ABNORMAL HIGH (ref 0–200)
HDL: 64.9 mg/dL (ref >39.00)
LDL Cholesterol: 150 mg/dL — ABNORMAL HIGH (ref 0–99)
NonHDL: 171.61
Total CHOL/HDL Ratio: 4
Triglycerides: 110 mg/dL (ref 0.0–149.0)
VLDL: 22 mg/dL (ref 0.0–40.0)

## 2022-11-03 LAB — TSH: TSH: 2.03 u[IU]/mL (ref 0.35–5.50)

## 2022-11-03 LAB — VITAMIN D 25 HYDROXY (VIT D DEFICIENCY, FRACTURES): VITD: 35.12 ng/mL (ref 30.00–100.00)

## 2022-11-03 LAB — HEPATIC FUNCTION PANEL
ALT: 11 U/L (ref 0–35)
AST: 18 U/L (ref 0–37)
Albumin: 4.1 g/dL (ref 3.5–5.2)
Alkaline Phosphatase: 53 U/L (ref 39–117)
Bilirubin, Direct: 0.1 mg/dL (ref 0.0–0.3)
Total Bilirubin: 0.4 mg/dL (ref 0.2–1.2)
Total Protein: 6.4 g/dL (ref 6.0–8.3)

## 2022-11-03 LAB — BASIC METABOLIC PANEL
BUN: 13 mg/dL (ref 6–23)
CO2: 31 mEq/L (ref 19–32)
Calcium: 9.6 mg/dL (ref 8.4–10.5)
Chloride: 101 mEq/L (ref 96–112)
Creatinine, Ser: 1.27 mg/dL — ABNORMAL HIGH (ref 0.40–1.20)
GFR: 43.11 mL/min — ABNORMAL LOW (ref >60.00)
Glucose, Bld: 88 mg/dL (ref 70–99)
Potassium: 4.2 mEq/L (ref 3.5–5.1)
Sodium: 140 mEq/L (ref 135–145)

## 2022-11-03 LAB — MICROALBUMIN / CREATININE URINE RATIO
Creatinine,U: 197.1 mg/dL
Microalb Creat Ratio: 2.7 mg/g (ref 0.0–30.0)
Microalb, Ur: 5.4 mg/dL — ABNORMAL HIGH (ref 0.0–1.9)

## 2022-11-03 LAB — T4, FREE: Free T4: 0.95 ng/dL (ref 0.60–1.60)

## 2022-11-03 LAB — SEDIMENTATION RATE: Sed Rate: 9 mm/hr (ref 0–30)

## 2022-11-03 LAB — C-REACTIVE PROTEIN: CRP: <1 mg/dL (ref 0.5–20.0)

## 2022-11-03 MED ORDER — BUTALBITAL-APAP-CAFFEINE 50-325-40 MG PO TABS: ORAL_TABLET | ORAL | 0 refills | Status: DC

## 2022-11-03 MED ORDER — LISINOPRIL 10 MG PO TABS: 10.0000 mg | ORAL_TABLET | Freq: Every day | ORAL | 1 refills | Status: DC

## 2022-11-03 MED ORDER — SERTRALINE HCL 25 MG PO TABS: 25.0000 mg | ORAL_TABLET | Freq: Every day | ORAL | 1 refills | Status: DC

## 2022-11-03 NOTE — Telephone Encounter (Signed)
Spoke to pt. Confirm to prescription of Sertraline was sent. She update she received a notification that they are in the process of filling. No further action is needed.

## 2022-11-03 NOTE — Patient Instructions (Addendum)
Good to see you today I am concerned about the amount of  your weight loss.  Blood work Facilities manager. Will do record review  to decide on change in medication  buspar to a better antidepressant  since busar is just for anxiety.  We will have to wean the buspar and add a different medication  I am sending in 12 of the butabital  as a compromise.     Do not take aleve orally as it can  cause kidney dysfunction  . Can try topical s or  tylenol for arthritis pain. Will plan follow up depnding on labs and  medication plan labs .

## 2022-11-03 NOTE — Telephone Encounter (Signed)
Pt called to FU on her prescription for the Sertraline. Pt was informed that MD sent it in to the pharmacy and if she didn't get a text soon letting her know it was ready for pickup, she could always contact them directly.  Pt also wanted to tell MD how much she appreciates her, and everyone here at the office.

## 2022-11-03 NOTE — Progress Notes (Signed)
Chief Complaint  Patient presents with   Annual Exam    HPI: Patient  Brooke Pennington  72 y.o. comes in today for [place] visit  And med evaluation. Since last visit coping but losing weight after husbands death in 2020/12/29 does have friends and acquaintances supportive and also widowed.  Not isolated doesn't think wants to go to counselor.    Bp : had been conrolled on lisniopril Renal insuff ckd had  renal US 2017/12/29 last  ct 1.44 nov 22 delayed fu  OA o fknees   recent seeing ortho . Has taken aleve almost once a day  not too back ANxiety dep meds   chonric alpraz use for years   weaned off buspar  not sure was helping  asks to try something else more for depression.  Husband passed in Dec 29, 2020  and  dec appetite since then  Asks to receive 15  ( inc number of butalbital per month)  is still on qid alprazolam as for years.  Health Maintenance  Topic Date Due   Medicare Annual Wellness (AWV)  05/03/2019   COVID-19 Vaccine (3 - Pfizer risk series) 11/19/2022 (Originally 03/03/2020)   INFLUENZA VACCINE  12/25/2022 (Originally 04/26/2022)   Zoster Vaccines- Shingrix (1 of 2) 02/01/2023 (Originally 03/16/1970)   Pneumonia Vaccine 42+ Years old (1 of 1 - PCV) 11/04/2023 (Originally 03/16/2018)   MAMMOGRAM  09/15/2023   DTaP/Tdap/Td (2 - Td or Tdap) 01/18/2026   COLONOSCOPY (Pts 45-42yr Insurance coverage will need to be confirmed)  06/20/2027   DEXA SCAN  Completed   Hepatitis C Screening  Completed   HPV VACCINES  Aged Out   Health Maintenance Review LIFESTYLE:  Exercise:   Tobacco/ETS: no Alcohol:  no Sugar beverages: mountain dew  and sweet  tea.  Sleep: up and down  8  Drug use: no HH of 1  no pets widowed 22022/04/05  ROS:  GEN/ HEENT: No fever,vision problems hearing changes, CV/ PULM; No chest pain shortness of breath cough, syncope,edema  change in exercise tolerance. GI /GU: No adominal pain, vomiting, change in bowel habits. No blood in the stool. No significant GU  symptoms. SKIN/HEME: ,no acute skin rashes suspicious lesions or bleeding. No lymphadenopathy, nodules, masses. Tends to bruise easily  on arms  no other bleeding NEURO/ PSYCH:  No neurologic signs such as weakness numbness. y. IMM/ Allergy: No unusual infections.  Allergy .   REST of 12 system review negative except as per HPI   Past Medical History:  Diagnosis Date   Allergy    Anal fissure    hx of same   Dependence, barbiturates ? 05/01/2013   Have come to the conclusion that she is dependent on this medication based on her history and the fact that she feels better when she takes it    Eczema    H/O rectal polypectomy    ?  uncertain  if colonic or rectal colonsocopy 8 2007 [facility]   Headache(784.0)    Hypertension    Lymphocytic gastritis    neg SBBX for celiac  in 8 2007    Macrocytosis 11/29/2011   Borderline  Check b12 folate at next visit    Medication management failed tox screen 11/27/2013   Neg for xanax and butalbital on urine screen . 2 /15    Menopause    age [age]with sx no hrt   Mild anemia 02/28/2014   PMR (polymyalgia rheumatica) (HCC)    Possible  diagnoses by primary care physician with her anemia and elevated sedimentation rate of 60 was on prednisone but stopped it herself and then felt fine    Past Surgical History:  Procedure Laterality Date   NO PAST SURGERIES      Family History  Problem Relation Age of Onset   Coronary artery disease Father    Heart disease Father    COPD Mother    Anemia Sister        needing transfusion some> vit b12 ?    Social History   Socioeconomic History   Marital status: Married    Spouse name: Not on file   Number of children: Not on file   Years of education: Not on file   Highest education level: Not on file  Occupational History   Not on file  Tobacco Use   Smoking status: Never   Smokeless tobacco: Never   Tobacco comments:    never used tobacco  Vaping Use   Vaping Use: Never used  Substance and  Sexual Activity   Alcohol use: Not Currently    Alcohol/week: 1.0 standard drink of alcohol    Types: 1 Glasses of wine per week    Comment: ocassionally   Drug use: No   Sexual activity: Yes  Other Topics Concern   Not on file  Social History Narrative   Married remarried   he is self-employed   hhof 2    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  Has smoke detector and wears seat belts.  No firearms. No excess sun exposure. Sees dentist regularly . No depression      Only 1 car at this time   No etoh   Father passed dec 23 15 chf heart related age 28      Social Determinants of Health   Financial Resource Strain: Not on file  Food Insecurity: Not on file  Transportation Needs: Not on file  Physical Activity: Not on file  Stress: Not on file  Social Connections: Not on file    Outpatient Medications Prior to Visit  Medication Sig Dispense Refill   ALPRAZolam (XANAX) 1 MG tablet TAKE 1 TABLET BY MOUTH FOUR TIMES A DAY AS NEEDED 120 tablet 0   dexamethasone 0.5 MG/5ML elixir Take by mouth.     finasteride (PROSCAR) 5 MG tablet Take 5 mg by mouth daily.     latanoprost (XALATAN) 0.005 % ophthalmic solution Place 1 drop into both eyes at bedtime.   3   LATISSE 0.03 % ophthalmic solution APPLY 1 DROP AT THE BASE OF THE UPPER EYELASHES EVERY DAY     MULTIPLE VITAMIN PO Take 1 tablet by mouth daily.     tretinoin (RETIN-A) 0.1 % cream Apply 1 application topically at bedtime.   3   busPIRone (BUSPAR) 10 MG tablet TAKE 1 AND 1/2 TABLETS BY MOUTH 3 TIMES A DAY 405 tablet 1   butalbital-acetaminophen-caffeine (FIORICET) 50-325-40 MG tablet TAKE 1 TO 2 TABLETS BY MOUTH AS NEEDED AS DIRECTED 10 tablet 0   lisinopril (ZESTRIL) 10 MG tablet TAKE 1 TABLET BY MOUTH EVERY DAY 30 tablet 0   hydroquinone 4 % cream Apply topically 2 (two) times daily. (Patient not taking: Reported on 11/03/2022)     triamcinolone cream (KENALOG) 0.1 %  (Patient not taking: Reported on  11/03/2022)     No facility-administered medications prior to visit.     EXAM:  BP 120/70 (BP  Location: Left Arm, Cuff Size: Normal)   Pulse 96   Temp 98 F (36.7 C) (Oral)   Ht 5' 2.8" (1.595 m)   Wt 116 lb 6.4 oz (52.8 kg)   SpO2 96%   BMI 20.75 kg/m   Body mass index is 20.75 kg/m. Wt Readings from Last 3 Encounters:  11/03/22 116 lb 6.4 oz (52.8 kg)  08/03/21 132 lb 6.4 oz (60.1 kg)  01/27/21 153 lb 6.4 oz (69.6 kg)    Physical Exam: Vital signs reviewed [id number] is a well-developed well-nourished alert cooperative    who appearsr stated age in no acute distress.  HEENT: normocephalic atraumatic , Eyes: PERRL EOM's full, conjunctiva clear, Nares: paten,t no deformity discharge or tenderness., Ears: no deformity EAC's clear TMs with normal landmarks. Mouth: clear OP, no lesions, edema.  Moist mucous membranes. Dentition in adequate repair. NECK: supple without masses, thyromegaly or bruits. CHEST/PULM:  Clear to auscultation and percussion breath sounds equal no wheeze , rales or rhonchi. No chest wall deformities or tenderness. Breast: normal by inspection . No dimpling, discharge, masses, tenderness or discharge . CV: PMI is nondisplaced, S1 S2 no gallops, murmurs, rubs. Peripheral pulses are full without delay.No JVD .  ABDOMEN: Bowel sounds normal nontender  No guard or rebound, no hepato splenomegal no CVA tenderness. Extremtities:  No clubbing cyanosis or edema, no acute joint swelling or redness no focal atrophy NEURO:  Oriented x3, cranial nerves 3-12 appear to be intact, no obvious focal weakness,gait within normal limits no abnormal reflexes or asymmetrical SKIN: No acute rashes normal turgor, color, no  petechiae. One small round ecchymosis l  forarm  PSYCH: Oriented, good eye contact, no obvious depression anxiety, cognition and judgment appear normal. LN: no cervical axillary inguinal adenopathy  Lab Results  Component Value Date   WBC 7.1 11/03/2022   HGB  10.5 (L) 11/03/2022   HCT 31.2 (L) 11/03/2022   PLT 294.0 11/03/2022   GLUCOSE 88 11/03/2022   CHOL 237 (H) 11/03/2022   TRIG 110.0 11/03/2022   HDL 64.90 11/03/2022   LDLDIRECT 168.2 12/25/2012   LDLCALC 150 (H) 11/03/2022   ALT 11 11/03/2022   AST 18 11/03/2022   NA 140 11/03/2022   K 4.2 11/03/2022   CL 101 11/03/2022   CREATININE 1.27 (H) 11/03/2022   BUN 13 11/03/2022   CO2 31 11/03/2022   TSH 2.03 11/03/2022   HGBA1C 5.5 11/03/2022   MICROALBUR 5.4 (H) 11/03/2022    BP Readings from Last 3 Encounters:  11/03/22 120/70  08/03/21 (!) 146/80  01/27/21 136/78    Lab plan reviewed with patient  updated lab indicated  is fasting mostly today   ASSESSMENT AND PLAN:  Discussed the following assessment and plan:    ICD-10-CM   1. Visit for preventive health examination  Z00.00     2. Weight loss  R63.4 Lipid panel    Sedimentation rate    C-reactive protein    T4, free    TSH    Vitamin D, 25-hydroxy   over 2 yeasr ago was near 151    3. Renal insufficiency  [id number] Basic metabolic panel    Hepatic function panel    Microalbumin / creatinine urine ratio    Lipid panel    Sedimentation rate    C-reactive protein    T4, free    TSH    Vitamin D, 25-hydroxy    4. Hyperlipidemia, unspecified hyperlipidemia type  99991111 Basic metabolic panel    Hepatic  function panel    Microalbumin / creatinine urine ratio    Lipid panel    Sedimentation rate    C-reactive protein    T4, free    TSH    Vitamin D, 25-hydroxy    5. Medication management  Z79.899 Hemoglobin 123456    Basic metabolic panel    Hepatic function panel    Microalbumin / creatinine urine ratio    Lipid panel    Sedimentation rate    C-reactive protein    T4, free    TSH    Vitamin D, 25-hydroxy    6. Essential hypertension  I10 CBC with Differential/Platelet    Basic metabolic panel    Hepatic function panel    Microalbumin / creatinine urine ratio    Lipid panel    Sedimentation rate     C-reactive protein    T4, free    TSH    Vitamin D, 25-hydroxy    7. Anxiety state  123XX123 Basic metabolic panel    Hepatic function panel    Microalbumin / creatinine urine ratio    Lipid panel    Sedimentation rate    C-reactive protein    T4, free    TSH    Vitamin D, 25-hydroxy    8. Hyperglycemia  R73.9 Hemoglobin 123456    Basic metabolic panel    Lipid panel    Sedimentation rate    C-reactive protein    T4, free    TSH    Vitamin D, 25-hydroxy    9. Easy bruising  R23.3 Lipid panel    Sedimentation rate    C-reactive protein    T4, free    TSH    Vitamin D, 25-hydroxy    10. Vitamin D deficiency  E55.9 Lipid panel    Sedimentation rate    C-reactive protein    T4, free    TSH    Vitamin D, 25-hydroxy    11. Bereavement  Z63.4 Lipid panel    Sedimentation rate    C-reactive protein    T4, free    TSH    Vitamin D, 25-hydroxy    12. Stage 3a chronic kidney disease (HCC)  N18.31     Stop aleve   can interfere with kidney function Patient insists weight loss from loss of appetite from loss of husband   no obv other sx  She has stopped the buspar  will review record and add med for depression declines counseling has support .   Update renal status  check urine for prootein consider adding sglt2  other  control bp consider renal consult alluded  to before pandemic . Hesitant and reluctant to inc the butabital med will increase too 12 a month  for now.  Plan fu depnding on lab results and med check for depression medication. Additional time counsel evaluation regarding  weight loss  depression meds  and bp other  conditions and plan fu  ( record review)  Return for depending on results and med changes .  Patient Care Team: Abhijot Straughter, Standley Brooking, MD as PCP - General (Internal Medicine) Lorelei Pont, Rudell Cobb, MD as Consulting Physician (Oncology) Patient Instructions  Good to see you today I am concerned about the amount of  your weight loss.  Blood work  Facilities manager. Will do record review  to decide on change in medication  buspar to a better antidepressant  since busar is just for anxiety.  We will  have to wean the buspar and add a different medication  I am sending in 12 of the butabital  as a compromise.     Do not take aleve orally as it can  cause kidney dysfunction  . Can try topical s or  tylenol for arthritis pain. Will plan follow up depnding on labs and  medication plan labs .  Standley Brooking. Beatrix Breece M.D.

## 2022-11-08 ENCOUNTER — Telehealth: Payer: Self-pay | Admitting: Internal Medicine

## 2022-11-08 NOTE — Telephone Encounter (Signed)
Pt has been taking sertraline (ZOLOFT) 25 MG tablet and as of 4 pm yesterday pt has been noticing blood in her urine. Pt was wondering if she should be still taking the meds or stopping?  Please advise.

## 2022-11-09 NOTE — Telephone Encounter (Signed)
Spoke to patient. Pt reports she started taking the sertraline(one pill) on Friday 11/04/22 late afternoon around 4pm. Around 6:30pm, went to bathroom to urinate, noticed light blood in the commode and when wiped. Couple hours later, urinates again, still some blood. Used again around 10:30pm- blood weren't much. By around 4am, there was no more of blood. Pt denied pain, nausea, vomiting or any sickness. Pt added she stop taking the sertraline. And that she was never on anti-depressant med before.  Patient brought up she is taking aleve for her knee pain. 257m -2 gels a day. She recall Dr. PRegis Billhad told her not to take aleve. She said she is working on not taking it as much and use pain ointment for her knee.pt reports she was taking aleve on Friday 11/04/22 morning.   Pt is inform of the lab result. Pt wants to know what she should do with the medication. If she should take a different medication? Please advise.  As for urine sample. Pt states she is feeling fine and no more of urine in blood. Wants to know if she still need to give urine sample. Please advise

## 2022-11-09 NOTE — Telephone Encounter (Signed)
Sertraline not usually  cause blood in urine.  Advise  evaluation for blood in urine   your labs showed some mild anemia again .   Please order a urinalysis and microcopic  and urine culture  and then plan fu evaluation

## 2022-11-09 NOTE — Progress Notes (Signed)
Labs ok except now anemia ( new)    cholesterol some better and kidney function stable and slightly improved .   Will need a fu for the anemia .   Plan repeat cvcdiff diff ferritin and ibc panel and vit b12  in March  . See phone note about blood in urine   that needs urinalysis with micro and culture  also

## 2022-11-10 ENCOUNTER — Other Ambulatory Visit: Payer: Self-pay | Admitting: Internal Medicine

## 2022-11-10 DIAGNOSIS — R310 Gross hematuria: Secondary | ICD-10-CM

## 2022-11-10 DIAGNOSIS — D649 Anemia, unspecified: Secondary | ICD-10-CM

## 2022-11-10 NOTE — Telephone Encounter (Signed)
Attempted to reach pt. Left a voicemail to call back.

## 2022-11-10 NOTE — Progress Notes (Signed)
Orders place

## 2022-11-10 NOTE — Telephone Encounter (Signed)
Still should proceed with evaluation.  I placed orders  for urinalysis and  blood anemia evaluation.  FOrgot to order yurine culture  please add this to the orders .   We need a follow up after all labs back . We may have her see a urologist

## 2022-11-14 ENCOUNTER — Other Ambulatory Visit: Payer: Self-pay | Admitting: Family

## 2022-11-14 NOTE — Telephone Encounter (Signed)
Spoke to patient and inform her message from Dr. Regis Bill.  Brooke Pennington reports she has occasionally chills and occasionally nausea. Wonders if that has to do with anemia.  Brooke Pennington states she can't come in soon for her urine sample. She wonders if it is okay for her for to do it in couple of weeks?  In regards to her anemia blood work in March, she wonders if there is something she can do in the mean time to help with anemia level while she waits for blood work in March.   For the Anti-depressant medication, Brooke Pennington wants to know Dr. Regis Bill advise on this. Should she take a different medication? Since she had stop taking the previous rx.   Please Advise.

## 2022-11-16 NOTE — Telephone Encounter (Signed)
Patient requesting a call to discuss her taking sertraline (ZOLOFT) 25 MG tablet. Patient also checking on refill for ALPRAZolam Duanne Moron) 1 MG tablet

## 2022-11-17 MED ORDER — ALPRAZOLAM 1 MG PO TABS: ORAL_TABLET | ORAL | 1 refills | Status: DC

## 2022-11-17 NOTE — Telephone Encounter (Signed)
I sent this in with one refill see other message

## 2022-11-17 NOTE — Addendum Note (Signed)
Addended byBurnis Medin on: 11/17/2022 02:17 PM   Modules accepted: Orders

## 2022-11-17 NOTE — Telephone Encounter (Signed)
Pt called to FU on her request for a refill of the following:  ALPRAZolam Duanne Moron) 1 MG tablet   Pt states she only has a few pills left and is worried she will run out before Monday, stating she knows MD will not be in on Monday.  Pt was informed that MD has already gone for the day, and will not be in tomorrow.  LOV:   11/03/22 = CPE  Please advise.   [facility] [place] Phone: [phone]  Fax: [phone]

## 2022-11-17 NOTE — Telephone Encounter (Signed)
Sent in alprazolam refill  Advise we have a another visit since she cannot do virtual   about the sertraline

## 2022-11-23 NOTE — Telephone Encounter (Signed)
Attempt to reach pt. Left a voicemail to callback.

## 2022-11-23 NOTE — Telephone Encounter (Signed)
Spoke to pt. Pt requests to do her lab and give urine sample along with her appt on the same day. Appt scheduled.   Inform pt, would confirm with Dr. Regis Bill if okay to be seen in [place] for 15 mins slot on 11/30/2022.

## 2022-11-24 ENCOUNTER — Telehealth: Payer: Self-pay

## 2022-11-24 NOTE — Telephone Encounter (Signed)
Attempt to reach. To inform regarding to change on her appt. Left a voicemail to call us back.

## 2022-11-25 NOTE — Telephone Encounter (Signed)
Pt appointment was change to 11/30/2022 to 12:00pm.   Pt is aware. Advise pt she can come early and have her lab done since it is already ordered before her appt. Also to give urine sample.

## 2022-11-28 ENCOUNTER — Other Ambulatory Visit: Payer: Self-pay | Admitting: Internal Medicine

## 2022-11-30 ENCOUNTER — Ambulatory Visit: Payer: Medicare Other | Admitting: Internal Medicine

## 2022-12-04 ENCOUNTER — Other Ambulatory Visit: Payer: Self-pay | Admitting: Internal Medicine

## 2022-12-05 DIAGNOSIS — Z1330 Encounter for screening examination for mental health and behavioral disorders, unspecified: Secondary | ICD-10-CM | POA: Diagnosis not present

## 2022-12-05 DIAGNOSIS — M170 Bilateral primary osteoarthritis of knee: Secondary | ICD-10-CM | POA: Diagnosis not present

## 2022-12-07 DIAGNOSIS — R319 Hematuria, unspecified: Secondary | ICD-10-CM | POA: Diagnosis not present

## 2022-12-07 DIAGNOSIS — Z1231 Encounter for screening mammogram for malignant neoplasm of breast: Secondary | ICD-10-CM | POA: Diagnosis not present

## 2022-12-07 DIAGNOSIS — L900 Lichen sclerosus et atrophicus: Secondary | ICD-10-CM | POA: Diagnosis not present

## 2022-12-07 DIAGNOSIS — N840 Polyp of corpus uteri: Secondary | ICD-10-CM | POA: Diagnosis not present

## 2022-12-07 DIAGNOSIS — R310 Gross hematuria: Secondary | ICD-10-CM | POA: Diagnosis not present

## 2022-12-07 LAB — HM MAMMOGRAPHY

## 2022-12-26 ENCOUNTER — Other Ambulatory Visit: Payer: Self-pay | Admitting: Internal Medicine

## 2023-01-04 DIAGNOSIS — Z1330 Encounter for screening examination for mental health and behavioral disorders, unspecified: Secondary | ICD-10-CM | POA: Diagnosis not present

## 2023-01-04 DIAGNOSIS — M170 Bilateral primary osteoarthritis of knee: Secondary | ICD-10-CM | POA: Diagnosis not present

## 2023-01-06 ENCOUNTER — Other Ambulatory Visit: Payer: Self-pay | Admitting: Internal Medicine

## 2023-01-23 ENCOUNTER — Other Ambulatory Visit: Payer: Self-pay | Admitting: Family

## 2023-01-25 ENCOUNTER — Telehealth: Payer: Self-pay

## 2023-01-25 NOTE — Patient Outreach (Signed)
  Care Coordination   01/25/2023 Name: Brooke Pennington MRN: [id number] DOB: [birth year]   Care Coordination Outreach Attempts:  An unsuccessful telephone outreach was attempted today to offer the patient information about available care coordination services.  Follow Up Plan:  Additional outreach attempts will be made to offer the patient care coordination information and services.   Encounter Outcome:  No Answer   Care Coordination Interventions:  No, not indicated    Bevelyn Ngo, BSW, CDP Social Worker, Certified Dementia Practitioner [facility] Care Management  Care Coordination [phone]

## 2023-01-25 NOTE — Telephone Encounter (Signed)
Pt is out of med

## 2023-01-30 ENCOUNTER — Telehealth: Payer: Self-pay

## 2023-01-30 NOTE — Patient Outreach (Signed)
  Care Coordination   01/30/2023 Name: Brooke Pennington MRN: [id number] DOB: 1951/05/02   Care Coordination Outreach Attempts:  A second unsuccessful outreach was attempted today to offer the patient with information about available care coordination services.  Follow Up Plan:  Additional outreach attempts will be made to offer the patient care coordination information and services.   Encounter Outcome:  No Answer   Care Coordination Interventions:  No, not indicated    Bevelyn Ngo, BSW, CDP Social Worker, Certified Dementia Practitioner [facility] Care Management  Care Coordination [phone]

## 2023-02-02 ENCOUNTER — Telehealth: Payer: Self-pay

## 2023-02-02 NOTE — Patient Outreach (Signed)
  Care Coordination   02/02/2023 Name: Brooke Pennington MRN: [id number] DOB: 11/07/1950   Care Coordination Outreach Attempts:  A third unsuccessful outreach was attempted today to offer the patient with information about available care coordination services.  Follow Up Plan:  No further outreach attempts will be made at this time. We have been unable to contact the patient to offer or enroll patient in care coordination services  Encounter Outcome:  No Answer   Care Coordination Interventions:  No, not indicated    Bevelyn Ngo, BSW, CDP Social Worker, Certified Dementia Practitioner [facility] Care Management  Care Coordination [phone]

## 2023-02-21 ENCOUNTER — Other Ambulatory Visit: Payer: Self-pay | Admitting: Family

## 2023-02-23 NOTE — Telephone Encounter (Addendum)
Pt is checking on the status of refill also pt would like a callback once rx has been sent

## 2023-02-28 DIAGNOSIS — N189 Chronic kidney disease, unspecified: Secondary | ICD-10-CM | POA: Diagnosis not present

## 2023-02-28 DIAGNOSIS — R319 Hematuria, unspecified: Secondary | ICD-10-CM | POA: Diagnosis not present

## 2023-03-01 ENCOUNTER — Other Ambulatory Visit: Payer: Self-pay | Admitting: Internal Medicine

## 2023-03-13 DIAGNOSIS — L650 Telogen effluvium: Secondary | ICD-10-CM | POA: Diagnosis not present

## 2023-03-13 DIAGNOSIS — L218 Other seborrheic dermatitis: Secondary | ICD-10-CM | POA: Diagnosis not present

## 2023-03-13 DIAGNOSIS — L648 Other androgenic alopecia: Secondary | ICD-10-CM | POA: Diagnosis not present

## 2023-03-15 DIAGNOSIS — L650 Telogen effluvium: Secondary | ICD-10-CM | POA: Diagnosis not present

## 2023-03-20 ENCOUNTER — Other Ambulatory Visit: Payer: Self-pay | Admitting: Internal Medicine

## 2023-03-23 DIAGNOSIS — I700 Atherosclerosis of aorta: Secondary | ICD-10-CM | POA: Diagnosis not present

## 2023-03-23 DIAGNOSIS — R319 Hematuria, unspecified: Secondary | ICD-10-CM | POA: Diagnosis not present

## 2023-03-23 DIAGNOSIS — N200 Calculus of kidney: Secondary | ICD-10-CM | POA: Diagnosis not present

## 2023-04-05 DIAGNOSIS — M170 Bilateral primary osteoarthritis of knee: Secondary | ICD-10-CM | POA: Diagnosis not present

## 2023-04-17 ENCOUNTER — Other Ambulatory Visit: Payer: Self-pay | Admitting: Family

## 2023-04-19 DIAGNOSIS — R319 Hematuria, unspecified: Secondary | ICD-10-CM | POA: Diagnosis not present

## 2023-04-19 DIAGNOSIS — N200 Calculus of kidney: Secondary | ICD-10-CM | POA: Diagnosis not present

## 2023-04-19 NOTE — Telephone Encounter (Signed)
Pt checking on progress of this refill. Says she is out

## 2023-04-30 ENCOUNTER — Other Ambulatory Visit: Payer: Self-pay | Admitting: Internal Medicine

## 2023-05-02 ENCOUNTER — Telehealth: Payer: Self-pay | Admitting: Internal Medicine

## 2023-05-02 NOTE — Telephone Encounter (Signed)
Pharm [facility] is calling and pt need a refill on ALPRAZolam (XANAX) 1 MG tablet  [id number] [place], [place] - [address] Phone: [phone]  Fax: [phone]

## 2023-05-03 MED ORDER — ALPRAZOLAM 1 MG PO TABS: ORAL_TABLET | ORAL | 1 refills | Status: DC

## 2023-05-03 NOTE — Telephone Encounter (Signed)
Pt called to F/U on this refill request. Pt understands MD only works a half day on Thursdays, and is off on Fridays. Pt is worried that she may run out before MD gets a chance to approve the refill.  Please call Pt back to let her know what to do, either way.

## 2023-05-04 NOTE — Telephone Encounter (Signed)
Attempted to reach pt. Left detail message that prescription request is sent. To give [place] a call if have any question.

## 2023-05-15 ENCOUNTER — Other Ambulatory Visit: Payer: Self-pay | Admitting: Family

## 2023-05-15 ENCOUNTER — Other Ambulatory Visit: Payer: Self-pay | Admitting: Internal Medicine

## 2023-05-18 NOTE — Telephone Encounter (Signed)
Pt calling to check on progress of this refill busPIRone (BUSPAR) 10 MG tablet

## 2023-06-12 ENCOUNTER — Other Ambulatory Visit: Payer: Self-pay | Admitting: Internal Medicine

## 2023-06-14 NOTE — Telephone Encounter (Signed)
Pt calling to check on progress of this refill.

## 2023-06-15 DIAGNOSIS — K080 Exfoliation of teeth due to systemic causes: Secondary | ICD-10-CM | POA: Diagnosis not present

## 2023-06-15 NOTE — Telephone Encounter (Signed)
I sent this in

## 2023-06-15 NOTE — Telephone Encounter (Signed)
Spoke to pt. Pt is aware.

## 2023-06-21 ENCOUNTER — Other Ambulatory Visit: Payer: Self-pay | Admitting: Internal Medicine

## 2023-06-21 DIAGNOSIS — H401132 Primary open-angle glaucoma, bilateral, moderate stage: Secondary | ICD-10-CM | POA: Diagnosis not present

## 2023-07-07 DIAGNOSIS — M170 Bilateral primary osteoarthritis of knee: Secondary | ICD-10-CM | POA: Diagnosis not present

## 2023-07-10 ENCOUNTER — Other Ambulatory Visit: Payer: Self-pay | Admitting: Internal Medicine

## 2023-08-07 ENCOUNTER — Other Ambulatory Visit: Payer: Self-pay | Admitting: Family

## 2023-08-09 NOTE — Telephone Encounter (Signed)
Re-routing to Panosh for attention

## 2023-08-10 NOTE — Telephone Encounter (Signed)
Pt is calling and will be out of med by tomorrow

## 2023-08-12 ENCOUNTER — Other Ambulatory Visit: Payer: Self-pay | Admitting: Family

## 2023-08-17 ENCOUNTER — Other Ambulatory Visit: Payer: Self-pay | Admitting: Family

## 2023-08-17 MED ORDER — ALPRAZOLAM 1 MG PO TABS: ORAL_TABLET | ORAL | 1 refills | Status: DC

## 2023-08-17 NOTE — Telephone Encounter (Signed)
Pt is call back about her ALPRAZolam Prudy Feeler) 1 MG tablet she stated it have no been sent to the pharmacy.

## 2023-08-22 NOTE — Telephone Encounter (Signed)
Attempted to reach pt. Left a voicemail to call us back.  

## 2023-09-14 NOTE — Telephone Encounter (Signed)
Attempted to reach pt. Left a voicemail to call us back.  

## 2023-09-28 ENCOUNTER — Telehealth: Payer: Self-pay | Admitting: Internal Medicine

## 2023-09-28 NOTE — Telephone Encounter (Unsigned)
 Copied from CRM [id number]. Topic: General - Call Back - No Documentation >> Sep 28, 2023  3:15 PM Brooke Pennington wrote: Reason for CRM: **Message for K[place]** Please call patient back as she is requesting to speak with you regarding her missed call from you.

## 2023-10-02 ENCOUNTER — Other Ambulatory Visit: Payer: Self-pay | Admitting: Family

## 2023-10-04 ENCOUNTER — Telehealth: Payer: Self-pay

## 2023-10-04 NOTE — Telephone Encounter (Signed)
 Copied from CRM [id number]. Topic: Clinical - Medication Question >> Oct 04, 2023  2:37 PM Leotis ORN wrote: Reason for CRM: Reason for CRM:  ALPRAZolam  (XANAX ) 1 MG tablet, butalbital -acetaminophen-caffeine  (FIORICET) 50-325-40 MG tablet informed pt that it was reordered 10/02/23 and that it can take 3 days , pt was adamant that I send a message to Moy[place], Willeen, CMA she would like a call back for reassurance.  Due to the weather coming she would like to get those before the roads aren't accessible.   Callback: [phone]

## 2023-10-05 ENCOUNTER — Other Ambulatory Visit: Payer: Self-pay | Admitting: Internal Medicine

## 2023-10-05 ENCOUNTER — Other Ambulatory Visit: Payer: Self-pay | Admitting: *Deleted

## 2023-10-05 MED ORDER — BUTALBITAL-APAP-CAFFEINE 50-325-40 MG PO TABS: ORAL_TABLET | ORAL | 1 refills | Status: DC

## 2023-10-05 MED ORDER — ALPRAZOLAM 1 MG PO TABS: ORAL_TABLET | ORAL | 1 refills | Status: DC

## 2023-10-05 NOTE — Telephone Encounter (Signed)
 Spoke to pt. See other encounter.

## 2023-10-05 NOTE — Telephone Encounter (Signed)
 Copied from CRM [id number]. Topic: Clinical - Medication Refill >> Oct 05, 2023  9:54 AM Mercedes MATSU wrote: Most Recent Primary Care Visit:  Provider: CHARLETT HOWARD K  Department: [facility]  Visit Type: PHYSICAL  Date: 11/03/2022  Medication: ALPRAZolam  (XANAX ) 1 MG tablet, butalbital -acetaminophen-caffeine  (FIORICET) 50-325-40 MG  Has the patient contacted their pharmacy? Yes (Agent: If no, request that the patient contact the pharmacy for the refill. If patient does not wish to contact the pharmacy document the reason why and proceed with request.) (Agent: If yes, when and what did the pharmacy advise?)  Is this the correct pharmacy for this prescription? Yes If no, delete pharmacy and type the correct one.  This is the patient's preferred pharmacy:  [id number] [place], [place] - [address] [place] [address] Phone: [phone] Fax: [phone]   Has the prescription been filled recently? Yes  Is the patient out of the medication? Yes  Has the patient been seen for an appointment in the last year OR does the patient have an upcoming appointment? Yes  Can we respond through MyChart? Yes  Agent: Please be advised that Rx refills may take up to 3 business days. We ask that you follow-up with your pharmacy.

## 2023-10-05 NOTE — Telephone Encounter (Signed)
 Copied from CRM [id number]. Topic: Clinical - Prescription Issue >> Oct 05, 2023  9:58 AM Mercedes MATSU wrote: Reason for CRM: ALPRAZolam  (XANAX ) 1 MG tablet, butalbital -acetaminophen-caffeine  (FIORICET) 50-325-40 MG tablet informed pt that it was reordered 10/02/23 and that it can take 3 days , pt was adamant that I send a message to Moyun, Willeen, CMA she would like a call back for reassurance. Due to the weather coming she would like to get those before the roads aren't accessible. Callback: [phone] Patient is still waiting for her medication.

## 2023-10-11 DIAGNOSIS — M170 Bilateral primary osteoarthritis of knee: Secondary | ICD-10-CM | POA: Diagnosis not present

## 2023-10-12 ENCOUNTER — Ambulatory Visit: Payer: Self-pay | Admitting: Internal Medicine

## 2023-10-12 DIAGNOSIS — R52 Pain, unspecified: Secondary | ICD-10-CM | POA: Diagnosis not present

## 2023-10-12 DIAGNOSIS — R609 Edema, unspecified: Secondary | ICD-10-CM | POA: Diagnosis not present

## 2023-10-12 NOTE — Telephone Encounter (Signed)
Rx sent on 10/05/2023.

## 2023-10-12 NOTE — Telephone Encounter (Signed)
Copied from CRM [id number]. Topic: Clinical - Pink Word Triage >> Oct 12, 2023  2:11 PM Gurney Maxin H wrote: Reason for Triage: Patient states her ankles are swollen, went to Orthopedic doctor for issue and they referred her back to her primary care doctor to further evaluate the issue of her ankles swelling. Agent tried to schedule patient with a different provider    Chief Complaint: Ankle swelling Symptoms: Swelling in ankles, top of feet, and calves Frequency: Ongoing for at least 2 weeks Pertinent Negatives: Patient denies pain, warmth, difficulty breathing Disposition: [] ED /[] Urgent Care (no appt availability in office) / [x] Appointment(In office/virtual)/ []  [facility]/ [] Home Care/ [] Refused Recommended Disposition /[] [facility]/ []  Follow-up with PCP  Additional Notes: Patient stated she has been having ankle, calf, and foot swelling for about 2 weeks. She stated she was just seen by an Orthopedic provider and she had x rays done that came back normal. Patient stated she was advised to follow up with her PCP right away. Patient denies pain or any other symptoms at this time. Patient has an appointment scheduled for tomorrow morning, 1/17.    Reason for Disposition  MILD or MODERATE ankle swelling (e.g., can't move joint normally, can't do usual activities) (Exceptions: Itchy, localized swelling; swelling is chronic.)  Answer Assessment - Initial Assessment Questions 1. LOCATION: "Which ankle is swollen?" "Where is the swelling?"     Both ankles and feet  2. ONSET: "When did the swelling start?"     About 2 weeks ago  3. SWELLING: "How bad is the swelling?" Or, "How large is it?" (e.g., mild, moderate, severe; size of localized swelling)    - NONE: No joint swelling.   - LOCALIZED: Localized; small area of puffy or swollen skin (e.g., insect bite, skin irritation).   - MILD: Joint looks or feels mildly swollen or puffy.   - MODERATE: Swollen; interferes with  normal activities (e.g., work or school); decreased range of movement; may be limping.   - SEVERE: Very swollen; can't move swollen joint at all; limping a lot or unable to walk.     Swelling in ankles and top of the feet. Patient stated there's some stiffness around the ankles but she can still walk fine.   4. PAIN: "Is there any pain?" If Yes, ask: "How bad is it?" (Scale 1-10; or mild, moderate, severe)   - NONE (0): no pain.   - MILD (1-3): doesn't interfere with normal activities.    - MODERATE (4-7): interferes with normal activities (e.g., work or school) or awakens from sleep, limping.    - SEVERE (8-10): excruciating pain, unable to do any normal activities, unable to walk.      No pain  5. OTHER SYMPTOMS: "Do you have any other symptoms?" (e.g., fever, chest pain, difficulty breathing, calf pain)     No  Protocols used: Ankle Swelling-A-AH

## 2023-10-13 ENCOUNTER — Telehealth: Payer: Self-pay | Admitting: Internal Medicine

## 2023-10-13 ENCOUNTER — Encounter: Payer: Self-pay | Admitting: Family Medicine

## 2023-10-13 ENCOUNTER — Ambulatory Visit: Payer: Medicare Other | Admitting: Family Medicine

## 2023-10-13 VITALS — BP 180/80 | HR 82 | Temp 97.8°F | Wt 119.4 lb

## 2023-10-13 DIAGNOSIS — N1832 Chronic kidney disease, stage 3b: Secondary | ICD-10-CM

## 2023-10-13 DIAGNOSIS — D649 Anemia, unspecified: Secondary | ICD-10-CM

## 2023-10-13 DIAGNOSIS — I10 Essential (primary) hypertension: Secondary | ICD-10-CM | POA: Diagnosis not present

## 2023-10-13 DIAGNOSIS — R600 Localized edema: Secondary | ICD-10-CM

## 2023-10-13 LAB — CBC WITH DIFFERENTIAL/PLATELET
Basophils Absolute: 0 10*3/uL (ref 0.0–0.1)
Basophils Relative: 0.2 % (ref 0.0–3.0)
Eosinophils Absolute: 0 10*3/uL (ref 0.0–0.7)
Eosinophils Relative: 0 % (ref 0.0–5.0)
HCT: 30.7 % — ABNORMAL LOW (ref 36.0–46.0)
Hemoglobin: 10.1 g/dL — ABNORMAL LOW (ref 12.0–15.0)
Lymphocytes Relative: 11.3 % — ABNORMAL LOW (ref 12.0–46.0)
Lymphs Abs: 1.1 10*3/uL (ref 0.7–4.0)
MCHC: 32.9 g/dL (ref 30.0–36.0)
MCV: 95.6 fL (ref 78.0–100.0)
Monocytes Absolute: 0.6 10*3/uL (ref 0.1–1.0)
Monocytes Relative: 5.9 % (ref 3.0–12.0)
Neutro Abs: 7.8 10*3/uL — ABNORMAL HIGH (ref 1.4–7.7)
Neutrophils Relative %: 82.6 % — ABNORMAL HIGH (ref 43.0–77.0)
Platelets: 325 10*3/uL (ref 150.0–400.0)
RBC: 3.21 Mil/uL — ABNORMAL LOW (ref 3.87–5.11)
RDW: 14.5 % (ref 11.5–15.5)
WBC: 9.4 10*3/uL (ref 4.0–10.5)

## 2023-10-13 LAB — COMPREHENSIVE METABOLIC PANEL
ALT: 15 U/L (ref 0–35)
AST: 20 U/L (ref 0–37)
Albumin: 4.8 g/dL (ref 3.5–5.2)
Alkaline Phosphatase: 66 U/L (ref 39–117)
BUN: 21 mg/dL (ref 6–23)
CO2: 30 meq/L (ref 19–32)
Calcium: 9.5 mg/dL (ref 8.4–10.5)
Chloride: 99 meq/L (ref 96–112)
Creatinine, Ser: 1.18 mg/dL (ref 0.40–1.20)
GFR: 46.78 mL/min — ABNORMAL LOW (ref >60.00)
Glucose, Bld: 85 mg/dL (ref 70–99)
Potassium: 3 meq/L — ABNORMAL LOW (ref 3.5–5.1)
Sodium: 141 meq/L (ref 135–145)
Total Bilirubin: 0.4 mg/dL (ref 0.2–1.2)
Total Protein: 7.4 g/dL (ref 6.0–8.3)

## 2023-10-13 LAB — MICROALBUMIN / CREATININE URINE RATIO
Creatinine,U: 33.6 mg/dL
Microalb Creat Ratio: 15.5 mg/g (ref 0.0–30.0)
Microalb, Ur: 5.2 mg/dL — ABNORMAL HIGH (ref 0.0–1.9)

## 2023-10-13 LAB — BRAIN NATRIURETIC PEPTIDE: Pro B Natriuretic peptide (BNP): 415 pg/mL — ABNORMAL HIGH (ref 0.0–100.0)

## 2023-10-13 LAB — TSH: TSH: 1.36 u[IU]/mL (ref 0.35–5.50)

## 2023-10-13 MED ORDER — CHLORTHALIDONE 25 MG PO TABS: 25.0000 mg | ORAL_TABLET | Freq: Every day | ORAL | 1 refills | Status: DC

## 2023-10-13 NOTE — Progress Notes (Signed)
Established Patient Office Visit  Subjective   Patient ID: Brooke Pennington, female    DOB: December 03, 1950  Age: 73 y.o. MRN: [id number]  Chief Complaint  Patient presents with   Edema    Patient complains of leg edema, x3 weeks     HPI   Jermeria is seen with 3-week history of bilateral lower extremity edema basically from the knee down to the foot.  Denies any prior history of similar edema.  Edema tends to be worse late in the day and improves early morning after elevation.  No orthopnea or dyspnea on exertion.  No recent chest pains.  Denies any new medications.  She takes lisinopril 10 mg daily for hypertension.  Did not take her medication this morning but has been otherwise compliant.  Denies any dietary changes.  She states she has eaten poorly since her husband a couple years ago and has lost substantial amount of weight over the past several years.  She does have history of chronic kidney disease with baseline GFR around 40 to.  She has history of hematuria which was worked up by urology with no clear etiology found.  Past Medical History:  Diagnosis Date   Allergy    Anal fissure    hx of same   Dependence, barbiturates ? 05/01/2013   Have come to the conclusion that she is dependent on this medication based on her history and the fact that she feels better when she takes it    Eczema    H/O rectal polypectomy    ?  uncertain  if colonic or rectal colonsocopy 8 2007 [facility]   Headache(784.0)    Hypertension    Lymphocytic gastritis    neg SBBX for celiac  in 8 2007    Macrocytosis 11/29/2011   Borderline  Check b12 folate at next visit    Medication management failed tox screen 11/27/2013   Neg for xanax and butalbital on urine screen . 2 /15    Menopause    age 61 with sx no hrt   Mild anemia 02/28/2014   PMR (polymyalgia rheumatica) (HCC)    Possible diagnoses by primary care physician with her anemia and elevated sedimentation rate of 60 was on prednisone but stopped  it herself and then felt fine   Past Surgical History:  Procedure Laterality Date   NO PAST SURGERIES      reports that she has never smoked. She has never used smokeless tobacco. She reports that she does not currently use alcohol after a past usage of about 1.0 standard drink of alcohol per week. She reports that she does not use drugs. family history includes Anemia in her sister; COPD in her mother; Coronary artery disease in her father; Heart disease in her father. No Known Allergies  Review of Systems  Constitutional:  Negative for malaise/fatigue.  Eyes:  Negative for blurred vision.  Respiratory:  Negative for cough and shortness of breath.   Cardiovascular:  Positive for leg swelling. Negative for chest pain, palpitations and orthopnea.  Neurological:  Negative for dizziness, weakness and headaches.      Objective:     BP (!) 180/80 (BP Location: Left Arm, Patient Position: Sitting, Cuff Size: Normal)   Pulse 82   Temp 97.8 F (36.6 C) (Oral)   Wt 119 lb 6.4 oz (54.2 kg)   SpO2 99%   BMI 21.29 kg/m  BP Readings from Last 3 Encounters:  10/13/23 (!) 180/80  11/03/22 120/70  08/03/21 Marland Kitchen)  146/80   Wt Readings from Last 3 Encounters:  10/13/23 119 lb 6.4 oz (54.2 kg)  11/03/22 116 lb 6.4 oz (52.8 kg)  08/03/21 132 lb 6.4 oz (60.1 kg)      Physical Exam Vitals reviewed.  Constitutional:      General: She is not in acute distress.    Appearance: She is not ill-appearing.  Cardiovascular:     Rate and Rhythm: Normal rate and regular rhythm.  Pulmonary:     Effort: Pulmonary effort is normal.     Breath sounds: Normal breath sounds. No wheezing or rales.  Musculoskeletal:     Comments: She has some obvious edema involving feet ankles and legs up to the knee.  1+ pitting.      No results found for any visits on 10/13/23.  Last CBC Lab Results  Component Value Date   WBC 7.1 11/03/2022   HGB 10.5 (L) 11/03/2022   HCT 31.2 (L) 11/03/2022   MCV 96.2  11/03/2022   MCH 33.5 03/04/2015   RDW 13.3 11/03/2022   PLT 294.0 11/03/2022   Last metabolic panel Lab Results  Component Value Date   GLUCOSE 88 11/03/2022   NA 140 11/03/2022   K 4.2 11/03/2022   CL 101 11/03/2022   CO2 31 11/03/2022   BUN 13 11/03/2022   CREATININE 1.27 (H) 11/03/2022   GFR 43.11 (L) 11/03/2022   CALCIUM 9.6 11/03/2022   PHOS 3.7 07/27/2010   PROT 6.4 11/03/2022   ALBUMIN 4.1 11/03/2022   BILITOT 0.4 11/03/2022   ALKPHOS 53 11/03/2022   AST 18 11/03/2022   ALT 11 11/03/2022   Last thyroid functions Lab Results  Component Value Date   TSH 2.03 11/03/2022      The 10-year ASCVD risk score (Arnett DK, et al., 2019) is: 23.9%    Assessment & Plan:   73 year old female who presents with bilateral lower extremity edema of 3-week duration.  She does not describe any orthopnea or dyspnea.  She also has hypertension history with very high blood pressure today which is atypical for her.  She did not take her medication this morning.  She does have chronic kidney disease which looks like has been stable for quite some time.  No recent change of medication.  Etiology of edema unclear.  Recommend the following  -Watch sodium intake and try to keep less than 2000 mg daily -Elevate legs frequently -Check labs with CMP, TSH, brain natruretic peptide, microalbumin creatinine screen -Start chlorthalidone 25 mg once daily in addition to her lisinopril -Consider echocardiogram if above unrevealing -Set up 3-week follow-up with primary to reassess blood pressure and edema -She also has history of normocytic anemia by most recent CBC back in February and will recheck CBC today   Return in about 2 weeks (around 10/27/2023).    Evelena Peat, MD

## 2023-10-13 NOTE — Telephone Encounter (Signed)
 Patient informed of the message and voiced understanding.

## 2023-10-13 NOTE — Telephone Encounter (Signed)
Pt saw Dr. Caryl Never today for ankle swelling and forgot to ask provider if she should continue to wear her compression socks to help. Please advise pt at: [phone].

## 2023-10-14 NOTE — Addendum Note (Signed)
Addended by: Kristian Covey on: 10/14/2023 08:11 PM   Modules accepted: Orders

## 2023-10-19 NOTE — Addendum Note (Signed)
Addended by: Christy Sartorius on: 10/19/2023 03:37 PM   Modules accepted: Orders

## 2023-10-29 ENCOUNTER — Other Ambulatory Visit: Payer: Self-pay | Admitting: Internal Medicine

## 2023-10-31 ENCOUNTER — Encounter: Payer: Self-pay | Admitting: Internal Medicine

## 2023-10-31 ENCOUNTER — Ambulatory Visit (INDEPENDENT_AMBULATORY_CARE_PROVIDER_SITE_OTHER): Payer: Medicare Other | Admitting: Internal Medicine

## 2023-10-31 VITALS — BP 130/70 | HR 100 | Temp 97.8°F | Ht 62.8 in | Wt 104.0 lb

## 2023-10-31 DIAGNOSIS — D649 Anemia, unspecified: Secondary | ICD-10-CM

## 2023-10-31 DIAGNOSIS — R634 Abnormal weight loss: Secondary | ICD-10-CM

## 2023-10-31 DIAGNOSIS — N1832 Chronic kidney disease, stage 3b: Secondary | ICD-10-CM | POA: Diagnosis not present

## 2023-10-31 DIAGNOSIS — R519 Headache, unspecified: Secondary | ICD-10-CM

## 2023-10-31 DIAGNOSIS — Z79899 Other long term (current) drug therapy: Secondary | ICD-10-CM

## 2023-10-31 DIAGNOSIS — R600 Localized edema: Secondary | ICD-10-CM

## 2023-10-31 DIAGNOSIS — F411 Generalized anxiety disorder: Secondary | ICD-10-CM

## 2023-10-31 DIAGNOSIS — E559 Vitamin D deficiency, unspecified: Secondary | ICD-10-CM

## 2023-10-31 DIAGNOSIS — I10 Essential (primary) hypertension: Secondary | ICD-10-CM | POA: Diagnosis not present

## 2023-10-31 DIAGNOSIS — E876 Hypokalemia: Secondary | ICD-10-CM

## 2023-10-31 DIAGNOSIS — R310 Gross hematuria: Secondary | ICD-10-CM

## 2023-10-31 MED ORDER — BUTALBITAL-APAP-CAFFEINE 50-325-40 MG PO TABS: ORAL_TABLET | ORAL | 0 refills | Status: DC

## 2023-10-31 NOTE — Patient Instructions (Addendum)
 Concern about some of the weight loss .  And need to recheck the  potassium level . And anemia  recheck .  We will plan  to see if echo test and any cardiology consult could be  [place].  Limit  advil aleve. Can decrease kidney function and cause  swelling and fluid retention.   Then plan FU   Make follow up appt for 2 -3 months or as indicated

## 2023-10-31 NOTE — Progress Notes (Signed)
 Chief Complaint  Patient presents with   Follow-up    Pt is here for follow up from office visit with Dr. Micheal. And lab work. Pt reports she was having swelling when she had visit with Dr. Micheal. New BP was added. Pt update swelling has resolved.     HPI: Brooke Pennington 73 y.o. come in for Chronic disease management  Absence from mu visits for a while but did see colleague  jan 17 when repsented with leg edema  was iven plan and chlorthatlidone  and fortunately  is  much better today and  edema is gone  and bp seem ok  She had been under care of OA from ortho  had used advil aleve since the fall off and on  Otho  also from swelling.  Not pain just tight .  Less activity  since husband diet   not motivated .  Just not hungry but not a terribly restricted diet  Has only taken sertraline  off and onprn  will go weeks without taking.  Only supplements were vits ca d  vit d and over 50 women's vits  should she continue?  Sleep  ok some early Reading   leisure has lots os stress that  causes sm sx  exp sinc husband passed  Had a few has in jan right sided and then left sided  that she took extra butalbital  with help so running out of this  realized was not supposed to over use but  had  a problem  no hx of ha in  past  and fortunately better now .  Asks for early refill   Denies cv dyspnea sx  no exercise  cause of  motivation and no physical  Has a fu renal doctor in the summer  Dr Junette      73 year old female who presents with bilateral lower extremity edema of 3-week duration.  She does not describe any orthopnea or dyspnea.  She also has hypertension history with very high blood pressure today which is atypical for her.  She did not take her medication this morning.  She does have chronic kidney disease which looks like has been stable for quite some time.  No recent change of medication.  Etiology of edema unclear.  Recommend the following   -Watch sodium intake and try to keep  less than 2000 mg daily -Elevate legs frequently -Check labs with CMP, TSH, brain natruretic peptide, microalbumin creatinine screen -Start chlorthalidone  25 mg once daily in addition to her lisinopril  -Consider echocardiogram if above unrevealing -Set up 3-week follow-up with primary to reassess blood pressure and edema -She also has history of normocytic anemia by most recent CBC back in February and will recheck CBC today   Return in about 2 weeks (around 10/27/2023).    Wolm Micheal, MD ROS: See pertinent positives and negatives per HPI.  Past Medical History:  Diagnosis Date   Allergy    Anal fissure    hx of same   Dependence, barbiturates ? 05/01/2013   Have come to the conclusion that she is dependent on this medication based on her history and the fact that she feels better when she takes it    Eczema    H/O rectal polypectomy    ?  uncertain  if colonic or rectal colonsocopy 8 2007 [facility]   Headache(784.0)    Hypertension    Lymphocytic gastritis    neg SBBX for celiac  in 8 2007  Macrocytosis 11/29/2011   Borderline  Check b12 folate at next visit    Medication management failed tox screen 11/27/2013   Neg for xanax  and butalbital  on urine screen . 2 /15    Menopause    age 87 with sx no hrt   Mild anemia 02/28/2014   PMR (polymyalgia rheumatica) (HCC)    Possible diagnoses by primary care physician with her anemia and elevated sedimentation rate of 60 was on prednisone but stopped it herself and then felt fine    Family History  Problem Relation Age of Onset   Coronary artery disease Father    Heart disease Father    COPD Mother    Anemia Sister        needing transfusion some> vit b12 ?    Social History   Socioeconomic History   Marital status: Married    Spouse name: Not on file   Number of children: Not on file   Years of education: Not on file   Highest education level: Not on file  Occupational History   Not on file  Tobacco Use    Smoking status: Never   Smokeless tobacco: Never   Tobacco comments:    never used tobacco  Vaping Use   Vaping status: Never Used  Substance and Sexual Activity   Alcohol use: Not Currently    Alcohol/week: 1.0 standard drink of alcohol    Types: 1 Glasses of wine per week    Comment: ocassionally   Drug use: No   Sexual activity: Yes  Other Topics Concern   Not on file  Social History Narrative   Married remarried   he is self-employed   hhof 2    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  Has smoke detector and wears seat belts.  No firearms. No excess sun exposure. Sees dentist regularly . No depression      Only 1 car at this time   No etoh   Father passed dec 23 15 chf heart related age 51      Social Drivers of Corporate Investment Banker Strain: Low Risk  (02/28/2023)   Received from [facility], [facility]   Overall Financial Resource Strain (CARDIA)    Difficulty of Paying Living Expenses: Not hard at all  Food Insecurity: No Food Insecurity (02/28/2023)   Received from [facility], [facility]   Hunger Vital Sign    Worried About Running Out of Food in the Last Year: Never true    Ran Out of Food in the Last Year: Never true  Transportation Needs: No Transportation Needs (02/28/2023)   Received from [facility], [facility]   PRAPARE - Transportation    Lack of Transportation (Medical): No    Lack of Transportation (Non-Medical): No  Physical Activity: Not on file  Stress: Not on file  Social Connections: Unknown (02/04/2022)   Received from [facility], [facility]   Social Network    Social Network: Not on file    Outpatient Medications Prior to Visit  Medication Sig Dispense Refill   ALPRAZolam  (XANAX ) 1 MG tablet TAKE 1 TABLET BY MOUTH FOUR TIMES A DAY AS NEEDED 120 tablet 1   busPIRone  (BUSPAR ) 10 MG tablet TAKE 1 AND 1/2 TABLETS BY MOUTH 3 TIMES A DAY 405 tablet 1   chlorthalidone  (HYGROTON ) 25 MG tablet Take  1 tablet (25 mg total) by mouth daily. 30 tablet 1   dexamethasone   0.5 MG/5ML elixir Take by mouth. As needed.     finasteride (PROSCAR) 5 MG tablet Take 5 mg by mouth daily.     latanoprost (XALATAN) 0.005 % ophthalmic solution Place 1 drop into both eyes at bedtime.   3   LATISSE 0.03 % ophthalmic solution APPLY 1 DROP AT THE BASE OF THE UPPER EYELASHES EVERY DAY     lisinopril  (ZESTRIL ) 10 MG tablet TAKE 1 TABLET BY MOUTH EVERY DAY 90 tablet 1   MULTIPLE VITAMIN PO Take 1 tablet by mouth daily.     sertraline  (ZOLOFT ) 25 MG tablet TAKE 1 TABLET BY MOUTH DAILY FOR DEPRESSION. MAY INCREASE TO 50 MG AS INDICATED AFTER 4-6 WEEKS 90 tablet 1   tretinoin (RETIN-A) 0.1 % cream Apply 1 application topically at bedtime.   3   butalbital -acetaminophen-caffeine  (FIORICET) 50-325-40 MG tablet TAKE 1 TO 2 TABLETS BY MOUTH AS NEEDED AS DIRECTED 12 tablet 1   hydroquinone 4 % cream Apply topically 2 (two) times daily. (Patient not taking: Reported on 10/31/2023)     triamcinolone  cream (KENALOG) 0.1 %  (Patient not taking: Reported on 11/03/2022)     No facility-administered medications prior to visit.     EXAM:  BP 130/70 (BP Location: Right Arm, Patient Position: Sitting, Cuff Size: Normal)   Pulse 100   Temp 97.8 F (36.6 C) (Oral)   Ht 5' 2.8 (1.595 m)   Wt 104 lb (47.2 kg)   SpO2 98%   BMI 18.54 kg/m   Body mass index is 18.54 kg/m. Wt Readings from Last 3 Encounters:  10/31/23 104 lb (47.2 kg)  10/13/23 119 lb 6.4 oz (54.2 kg)  11/03/22 116 lb 6.4 oz (52.8 kg)    GENERAL: vitals reviewed and listed above, alert, oriented, appears well hydrated and in no acute distress HEENT: atraumatic, conjunctiva  clear, no obvious abnormalities on inspection of external nose and ears  NECK: no obvious masses on inspection palpation  LUNGS: clear to auscultation bilaterally, no wheezes, rales or rhonchi, good air movement CV: HRRR, no clubbing cyanosis or  peripheral edema nl cap refill  MS: moves  all extremities without noticeable focal  abnormality PSYCH: pleasant and cooperative, no obvious depression or anxiety Lab Results  Component Value Date   WBC 9.4 10/13/2023   HGB 10.1 (L) 10/13/2023   HCT 30.7 (L) 10/13/2023   PLT 325.0 10/13/2023   GLUCOSE 85 10/13/2023   CHOL 237 (H) 11/03/2022   TRIG 110.0 11/03/2022   HDL 64.90 11/03/2022   LDLDIRECT 168.2 12/25/2012   LDLCALC 150 (H) 11/03/2022   ALT 15 10/13/2023   AST 20 10/13/2023   NA 141 10/13/2023   K 3.0 (L) 10/13/2023   CL 99 10/13/2023   CREATININE 1.18 10/13/2023   BUN 21 10/13/2023   CO2 30 10/13/2023   TSH 1.36 10/13/2023   HGBA1C 5.5 11/03/2022   MICROALBUR 5.2 (H) 10/13/2023   BP Readings from Last 3 Encounters:  10/31/23 130/70  10/13/23 (!) 180/80  11/03/22 120/70   Bnp was 415  echo advised  and ordered  ? Not done  ASSESSMENT AND PLAN:  Discussed the following assessment and plan:  Medication management - Plan: Basic metabolic panel, VITAMIN D  25 Hydroxy (Vit-D Deficiency, Fractures), CBC with Differential/Platelet, Vitamin B12, Iron, TIBC and Ferritin Panel, Sedimentation rate  Anemia, unspecified type - Plan: Basic metabolic panel, VITAMIN D  25 Hydroxy (Vit-D Deficiency, Fractures), CBC with Differential/Platelet, Vitamin B12, Iron, TIBC and Ferritin Panel, Sedimentation rate, Vitamin B12, IBC  panel, Ferritin, CBC with Differential/Platelet, Urinalysis, Complete  Weight loss - Plan: Basic metabolic panel, VITAMIN D  25 Hydroxy (Vit-D Deficiency, Fractures), CBC with Differential/Platelet, Vitamin B12, Iron, TIBC and Ferritin Panel  Stage 3b chronic kidney disease (HCC) - Plan: Basic metabolic panel, VITAMIN D  25 Hydroxy (Vit-D Deficiency, Fractures), CBC with Differential/Platelet, Vitamin B12, Iron, TIBC and Ferritin Panel  Low blood potassium - uncertain cause pre  chlorthalidone  - Plan: Basic metabolic panel, VITAMIN D  25 Hydroxy (Vit-D Deficiency, Fractures), CBC with Differential/Platelet,  Vitamin B12, Iron, TIBC and Ferritin Panel  HYPERTENSION, BENIGN - Plan: Basic metabolic panel, VITAMIN D  25 Hydroxy (Vit-D Deficiency, Fractures), CBC with Differential/Platelet, Vitamin B12, Iron, TIBC and Ferritin Panel  Vitamin D  deficiency - Plan: Basic metabolic panel, VITAMIN D  25 Hydroxy (Vit-D Deficiency, Fractures), CBC with Differential/Platelet, Vitamin B12, Iron, TIBC and Ferritin Panel  One-sided headache - Plan: Sedimentation rate  hematuris hx of same - Plan: Vitamin B12, IBC panel, Ferritin, CBC with Differential/Platelet, Urinalysis, Complete  Bilateral leg edema resolved - had elevated bnp rx with cholorthalidone  Anxiety state  Hx of renal stones  Aleve, advil  off an on  since November.   Could have added to   problem  Not sure if has seen  nephrology and will follow dec gfr .   For now  do not restart the sertraline   until all figured out   and works best if taken regularly Did advise rethink counsel help for the stress issues. Fortunately the has are better  Cannot explain the  low K  she stated the dex meth rinse is ocass use for a week for oral problem and not regular use  . Denis other supplements that could be contributing ( just vits)  Asks to get echo card in [place] cause of location and referrals close to her  residence  No evidence of current dec lvf but needs ehco and probably cardiac consult    Suppose elevated bnp could be from other causes. Concern about nsaid use.  Her weight loss since I saw her last over a year ago is significant and she doesn't think a problem feels from not hungry and stress.  Some of los from less edema  Many issues  addressed today . Fu bmp anemia check  ok to take multivits and then go from there  Will refill butalbital  x 1 today   -Patient advised to return or notify health care team  if  new concerns arise. Record review visit counsel  50 minutes  and plan fu . Patient Instructions  Concern about some of the weight  loss .  And need to recheck the  potassium level . And anemia  recheck .  We will plan  to see if echo test and any cardiology consult could be [place].  Limit  advil aleve. Can decrease kidney function and cause  swelling and fluid retention.   Then plan FU   Make follow up appt for 2 -3 months or as indicated   Christofer Shen K. Jax Abdelrahman M.D.

## 2023-11-01 LAB — URINALYSIS, COMPLETE
Bacteria, UA: NONE SEEN /[HPF]
Bilirubin Urine: NEGATIVE
Glucose, UA: NEGATIVE
Hgb urine dipstick: NEGATIVE
Hyaline Cast: NONE SEEN /[LPF]
Ketones, ur: NEGATIVE
Nitrite: NEGATIVE
Protein, ur: NEGATIVE
RBC / HPF: NONE SEEN /[HPF] (ref 0–2)
Specific Gravity, Urine: 1.015 (ref 1.001–1.035)
Squamous Epithelial / HPF: NONE SEEN /[HPF] (ref <=5)
pH: 5.5 (ref 5.0–8.0)

## 2023-11-01 LAB — CBC WITH DIFFERENTIAL/PLATELET
Basophils Absolute: 0.1 10*3/uL (ref 0.0–0.1)
Basophils Relative: 1.2 % (ref 0.0–3.0)
Eosinophils Absolute: 0 10*3/uL (ref 0.0–0.7)
Eosinophils Relative: 0.3 % (ref 0.0–5.0)
HCT: 34.5 % — ABNORMAL LOW (ref 36.0–46.0)
Hemoglobin: 11.5 g/dL — ABNORMAL LOW (ref 12.0–15.0)
Lymphocytes Relative: 21.2 % (ref 12.0–46.0)
Lymphs Abs: 1.4 10*3/uL (ref 0.7–4.0)
MCHC: 33.3 g/dL (ref 30.0–36.0)
MCV: 95.7 fL (ref 78.0–100.0)
Monocytes Absolute: 0.3 10*3/uL (ref 0.1–1.0)
Monocytes Relative: 5.3 % (ref 3.0–12.0)
Neutro Abs: 4.7 10*3/uL (ref 1.4–7.7)
Neutrophils Relative %: 72 % (ref 43.0–77.0)
Platelets: 300 10*3/uL (ref 150.0–400.0)
RBC: 3.61 Mil/uL — ABNORMAL LOW (ref 3.87–5.11)
RDW: 13.5 % (ref 11.5–15.5)
WBC: 6.5 10*3/uL (ref 4.0–10.5)

## 2023-11-01 LAB — BASIC METABOLIC PANEL
BUN: 38 mg/dL — ABNORMAL HIGH (ref 6–23)
CO2: 29 meq/L (ref 19–32)
Calcium: 10 mg/dL (ref 8.4–10.5)
Chloride: 96 meq/L (ref 96–112)
Creatinine, Ser: 1.88 mg/dL — ABNORMAL HIGH (ref 0.40–1.20)
GFR: 26.74 mL/min — ABNORMAL LOW (ref >60.00)
Glucose, Bld: 77 mg/dL (ref 70–99)
Potassium: 3.8 meq/L (ref 3.5–5.1)
Sodium: 135 meq/L (ref 135–145)

## 2023-11-01 LAB — IRON,TIBC AND FERRITIN PANEL
%SAT: 38 % (ref 16–45)
Ferritin: 71 ng/mL (ref 16–288)
Iron: 138 ug/dL (ref 45–160)
TIBC: 366 ug/dL (ref 250–450)

## 2023-11-01 LAB — VITAMIN D 25 HYDROXY (VIT D DEFICIENCY, FRACTURES): VITD: 44.64 ng/mL (ref 30.00–100.00)

## 2023-11-01 LAB — SEDIMENTATION RATE: Sed Rate: 21 mm/h (ref 0–30)

## 2023-11-01 LAB — VITAMIN B12: Vitamin B-12: 549 pg/mL (ref 211–911)

## 2023-11-04 ENCOUNTER — Other Ambulatory Visit: Payer: Self-pay | Admitting: Family Medicine

## 2023-11-10 NOTE — Progress Notes (Signed)
 So I am concerned about the  recent decrease in your kidney function . I want  you to see a nephrologist  ( I think you saw a urologist but not sure if nephrologist )   Please refer to  nephology Location closer  to her  residence  [place]  or maybe [place] for CKD decreasing gfr   and send info to her urologist also ( dr Junette)  B12 and iron vit D levels are ok  anemia a bit better

## 2023-11-16 ENCOUNTER — Other Ambulatory Visit: Payer: Self-pay | Admitting: Internal Medicine

## 2023-11-20 ENCOUNTER — Other Ambulatory Visit: Payer: Self-pay | Admitting: Internal Medicine

## 2023-11-27 ENCOUNTER — Other Ambulatory Visit: Payer: Self-pay

## 2023-11-27 DIAGNOSIS — N1832 Chronic kidney disease, stage 3b: Secondary | ICD-10-CM

## 2023-11-28 ENCOUNTER — Other Ambulatory Visit: Payer: Self-pay | Admitting: Family

## 2023-12-01 ENCOUNTER — Other Ambulatory Visit: Payer: Self-pay | Admitting: Internal Medicine

## 2023-12-01 ENCOUNTER — Telehealth: Payer: Self-pay

## 2023-12-01 NOTE — Telephone Encounter (Signed)
 Copied from CRM [id number]. Topic: Clinical - Medication Refill >> Dec 01, 2023  2:12 PM [place] wrote: Most Recent Primary Care Visit:  Provider: Berniece Andreas K  Department: [facility]  Visit Type: OFFICE VISIT  Date: 10/31/2023  Medication: ALPRAZolam Prudy Feeler) 1 MG tablet  Has the patient contacted their pharmacy? Yes (Agent: If no, request that the patient contact the pharmacy for the refill. If patient does not wish to contact the pharmacy document the reason why and proceed with request.) (Agent: If yes, when and what did the pharmacy advise?)  Is this the correct pharmacy for this prescription? Yes If no, delete pharmacy and type the correct one.  This is the patient's preferred pharmacy:  [id number] - [place], [place] - [address] [place] [address] Phone: [phone] Fax: [phone]   Has the prescription been filled recently? Yes  Is the patient out of the medication? Yes  Has the patient been seen for an appointment in the last year OR does the patient have an upcoming appointment? Yes  Can we respond through MyChart? Yes  Agent: Please be advised that Rx refills may take up to 3 business days. We ask that you follow-up with your pharmacy.

## 2023-12-01 NOTE — Telephone Encounter (Signed)
 Copied from CRM [id number]. Topic: General - Inquiry >> Dec 01, 2023  2:15 PM [place] wrote: Reason for CRM: Patient asking for  Vickii Chafe, CMA to call her back when she is available

## 2023-12-04 ENCOUNTER — Telehealth: Payer: Self-pay | Admitting: Internal Medicine

## 2023-12-04 MED ORDER — ALPRAZOLAM 1 MG PO TABS: ORAL_TABLET | ORAL | 1 refills | Status: DC

## 2023-12-04 NOTE — Telephone Encounter (Signed)
 Copied from CRM [id number]. Topic: Appointments - Scheduling Inquiry for Clinic >> Dec 04, 2023  3:13 PM Taleah C wrote: Reason for CRM: pt called and asked if Dr. Rosezella [place] nurse could give her a call back. She said its regarding an appointment she's been wanting to have. Patient did not want to go into details with me and preferred to only discuss with the nurse. Please call and advise.

## 2023-12-05 NOTE — Telephone Encounter (Signed)
 Attempted to reach pt to inform her the Rx has sent. Left a voicemail to call us back.

## 2023-12-06 NOTE — Telephone Encounter (Signed)
 Attempted to reach pt. Left a voicemail to call us back.

## 2023-12-06 NOTE — Telephone Encounter (Signed)
 Spoke to pt. Pt is following up on a message that was discuss on lab result.   Pt reports she received two calls from the nephrology and she has not scheduled the appt.   Pt states she is still confused about why she is needing to see a nephrology when she does not have any pain or symptoms. She went over on her seeing urology and they did x-ray, [place], cystoscopy with her urology. They found kidney stone and encourage to drink more water. Pt states she has a follow up with them in July.   Pt ask if Dr. Fabian Sharp has seen the reports from Urology last year. Inform pt, Dr. Fabian Sharp probably already aware of it since we are able to get see information from [facility].   Pt would like to discuss with Dr. Fabian Sharp about her referral at her follow up in May. Inform her I would let pcp know.   Relay message to provider on conversation with patient.   Provider encourages pt to go with the referral and is aware pt has no sx and her history with the urology. Provider continues pt would not notice her kidney function goes down and encourage pt to go before it gets worse and to get information and advise on best medication for pt.    Attempted to reach pt. Left a detail message that provider encourage her to go and call us back to discuss on detail information.

## 2023-12-06 NOTE — Telephone Encounter (Signed)
 Please see other encounter.

## 2023-12-07 NOTE — Telephone Encounter (Signed)
 Attempted to reach pt. Left a voicemail to call us back.

## 2023-12-14 ENCOUNTER — Other Ambulatory Visit: Payer: Self-pay | Admitting: Internal Medicine

## 2023-12-18 NOTE — Telephone Encounter (Signed)
 Attempted to follow up pt on the referral. Left a voicemail to call us back.

## 2024-01-01 ENCOUNTER — Other Ambulatory Visit: Payer: Self-pay | Admitting: Family

## 2024-01-02 NOTE — Telephone Encounter (Signed)
 Attempted to reach pt. Left a voicemail to call us back.

## 2024-01-08 NOTE — Telephone Encounter (Signed)
 Patient called. Pharmacy sent request on the 7th and it has not yet been completed she was told to contact provide. Only has a few left. Called [facility], spoke with [place]. Was sent to incorrect provider. Will route to Dr Ethel Henry. I let patient know. Thank you

## 2024-01-10 DIAGNOSIS — M170 Bilateral primary osteoarthritis of knee: Secondary | ICD-10-CM | POA: Diagnosis not present

## 2024-01-10 NOTE — Telephone Encounter (Signed)
 Sent in today  this s early but  holiday this weekend

## 2024-01-17 DIAGNOSIS — R634 Abnormal weight loss: Secondary | ICD-10-CM | POA: Diagnosis not present

## 2024-01-17 DIAGNOSIS — Z1231 Encounter for screening mammogram for malignant neoplasm of breast: Secondary | ICD-10-CM | POA: Diagnosis not present

## 2024-01-17 DIAGNOSIS — L900 Lichen sclerosus et atrophicus: Secondary | ICD-10-CM | POA: Diagnosis not present

## 2024-01-17 DIAGNOSIS — Z01411 Encounter for gynecological examination (general) (routine) with abnormal findings: Secondary | ICD-10-CM | POA: Diagnosis not present

## 2024-01-17 DIAGNOSIS — Z1331 Encounter for screening for depression: Secondary | ICD-10-CM | POA: Diagnosis not present

## 2024-01-17 LAB — HM MAMMOGRAPHY

## 2024-01-23 ENCOUNTER — Other Ambulatory Visit: Payer: Self-pay | Admitting: Internal Medicine

## 2024-01-25 ENCOUNTER — Other Ambulatory Visit: Payer: Self-pay | Admitting: Internal Medicine

## 2024-01-25 NOTE — Telephone Encounter (Signed)
 Copied from CRM [id number]. Topic: Clinical - Medication Refill >> Jan 25, 2024 10:38 AM Howard Macho wrote: Most Recent Primary Care Visit:  Provider: Daphine Eagle K  Department: [facility]  Visit Type: OFFICE VISIT  Date: 10/31/2023  Medication: ALPRAZolam  (XANAX ) 1 MG tablet and butalbital -acetaminophen-caffeine  (FIORICET) 50-325-40 MG tablet  Has the patient contacted their pharmacy? Yes (Agent: If no, request that the patient contact the pharmacy for the refill. If patient does not wish to contact the pharmacy document the reason why and proceed with request.) (Agent: If yes, when and what did the pharmacy advise?)  Is this the correct pharmacy for this prescription? Yes If no, delete pharmacy and type the correct one.  This is the patient's preferred pharmacy:  [id number] [place] [place] [facility] [address]  [place] [address] Phone: [phone] Fax: [phone]   Has the prescription been filled recently? No  Is the patient out of the medication? Yes  Has the patient been seen for an appointment in the last year OR does the patient have an upcoming appointment? Yes  Can we respond through MyChart? no  Agent: Please be advised that Rx refills may take up to 3 business days. We ask that you follow-up with your pharmacy.

## 2024-01-26 ENCOUNTER — Other Ambulatory Visit: Payer: Self-pay

## 2024-01-26 MED ORDER — ALPRAZOLAM 1 MG PO TABS: ORAL_TABLET | ORAL | 1 refills | Status: DC

## 2024-01-29 ENCOUNTER — Telehealth: Payer: Self-pay | Admitting: Internal Medicine

## 2024-01-29 ENCOUNTER — Telehealth: Payer: Self-pay

## 2024-01-29 NOTE — Telephone Encounter (Signed)
 Copied from CRM [id number]. Topic: Clinical - Medication Question >> Jan 29, 2024  3:52 PM Aisha D wrote: Reason for CRM: [facility] wanted to know when if would be the correct time to send in a refill request for the butalbital -acetaminophen-caffeine  (FIORICET) 50-325-40 MG tablet. I informed the Kaitlyn that the original request was refused on 5/2 because it was too early for that medication. Kaitlyn stated that she needs a date on when to resend the medication refill request and provided her call back number [phone].

## 2024-01-29 NOTE — Telephone Encounter (Signed)
 Spoke pt. Please see other note encounter.

## 2024-01-29 NOTE — Telephone Encounter (Signed)
 Copied from CRM [id number]. Topic: Clinical - Medication Question >> Jan 29, 2024  2:28 PM Orien Bird wrote: Reason for CRM: Patient is wondering when this medication will get refilled she stated she has Alprazplam but this is the only one that didn't get sent to the pharmacy.    butalbital -acetaminophen-caffeine  (FIORICET) 50-325-40 MG tablet

## 2024-01-30 MED ORDER — BUTALBITAL-APAP-CAFFEINE 50-325-40 MG PO TABS: ORAL_TABLET | ORAL | 0 refills | Status: DC

## 2024-01-30 NOTE — Telephone Encounter (Signed)
 Attempted to reach pt. Left a voicemail to call us back.

## 2024-01-30 NOTE — Addendum Note (Signed)
 Addended byDaphine Eagle K on: 01/30/2024 11:11 AM   Modules accepted: Orders

## 2024-01-31 ENCOUNTER — Ambulatory Visit: Payer: Medicare Other | Admitting: Internal Medicine

## 2024-01-31 NOTE — Telephone Encounter (Signed)
Attempted to reach pt. Left a voicemail to call back.

## 2024-02-07 ENCOUNTER — Ambulatory Visit: Admitting: Internal Medicine

## 2024-02-21 ENCOUNTER — Encounter: Payer: Self-pay | Admitting: Internal Medicine

## 2024-02-21 ENCOUNTER — Ambulatory Visit (INDEPENDENT_AMBULATORY_CARE_PROVIDER_SITE_OTHER): Admitting: Internal Medicine

## 2024-02-21 VITALS — BP 120/68 | HR 69 | Temp 97.5°F | Ht 62.8 in | Wt 108.0 lb

## 2024-02-21 DIAGNOSIS — Z79899 Other long term (current) drug therapy: Secondary | ICD-10-CM | POA: Diagnosis not present

## 2024-02-21 DIAGNOSIS — R7989 Other specified abnormal findings of blood chemistry: Secondary | ICD-10-CM | POA: Diagnosis not present

## 2024-02-21 DIAGNOSIS — N1832 Chronic kidney disease, stage 3b: Secondary | ICD-10-CM

## 2024-02-21 DIAGNOSIS — E559 Vitamin D deficiency, unspecified: Secondary | ICD-10-CM

## 2024-02-21 DIAGNOSIS — I10 Essential (primary) hypertension: Secondary | ICD-10-CM

## 2024-02-21 DIAGNOSIS — D649 Anemia, unspecified: Secondary | ICD-10-CM | POA: Diagnosis not present

## 2024-02-21 DIAGNOSIS — F411 Generalized anxiety disorder: Secondary | ICD-10-CM

## 2024-02-21 LAB — CBC WITH DIFFERENTIAL/PLATELET
Basophils Absolute: 0 10*3/uL (ref 0.0–0.1)
Basophils Relative: 0.6 % (ref 0.0–3.0)
Eosinophils Absolute: 0.2 10*3/uL (ref 0.0–0.7)
Eosinophils Relative: 2.3 % (ref 0.0–5.0)
HCT: 28.5 % — ABNORMAL LOW (ref 36.0–46.0)
Hemoglobin: 9.5 g/dL — ABNORMAL LOW (ref 12.0–15.0)
Lymphocytes Relative: 21.1 % (ref 12.0–46.0)
Lymphs Abs: 1.5 10*3/uL (ref 0.7–4.0)
MCHC: 33.2 g/dL (ref 30.0–36.0)
MCV: 99.6 fl (ref 78.0–100.0)
Monocytes Absolute: 0.4 10*3/uL (ref 0.1–1.0)
Monocytes Relative: 6.3 % (ref 3.0–12.0)
Neutro Abs: 4.9 10*3/uL (ref 1.4–7.7)
Neutrophils Relative %: 69.7 % (ref 43.0–77.0)
Platelets: 237 10*3/uL (ref 150.0–400.0)
RBC: 2.86 Mil/uL — ABNORMAL LOW (ref 3.87–5.11)
RDW: 13.9 % (ref 11.5–15.5)
WBC: 7 10*3/uL (ref 4.0–10.5)

## 2024-02-21 LAB — IBC + FERRITIN
Ferritin: 57.1 ng/mL (ref 10.0–291.0)
Iron: 104 ug/dL (ref 42–145)
Saturation Ratios: 32.6 % (ref 20.0–50.0)
TIBC: 319.2 ug/dL (ref 250.0–450.0)
Transferrin: 228 mg/dL (ref 212.0–360.0)

## 2024-02-21 LAB — BASIC METABOLIC PANEL WITH GFR
BUN: 16 mg/dL (ref 6–23)
CO2: 30 meq/L (ref 19–32)
Calcium: 9.7 mg/dL (ref 8.4–10.5)
Chloride: 103 meq/L (ref 96–112)
Creatinine, Ser: 1.6 mg/dL — ABNORMAL HIGH (ref 0.40–1.20)
GFR: 32.38 mL/min — ABNORMAL LOW (ref >60.00)
Glucose, Bld: 90 mg/dL (ref 70–99)
Potassium: 4.7 meq/L (ref 3.5–5.1)
Sodium: 139 meq/L (ref 135–145)

## 2024-02-21 LAB — VITAMIN B12: Vitamin B-12: 385 pg/mL (ref 211–911)

## 2024-02-21 LAB — VITAMIN D 25 HYDROXY (VIT D DEFICIENCY, FRACTURES): VITD: 37.4 ng/mL (ref 30.00–100.00)

## 2024-02-21 MED ORDER — BUTALBITAL-APAP-CAFFEINE 50-325-40 MG PO TABS: ORAL_TABLET | ORAL | 1 refills | Status: DC

## 2024-02-21 NOTE — Patient Instructions (Addendum)
 Good to see you today  We can update  lab today .  Still limit  butalbital  for reasons discussed.  Stay on centrum silver    for now and not the other supplements  . Ok to  not take the sertraline . If  you do take at least  4 weeks   at a given dose  25 mg    if  needed can try 50 mg per day  and see how helps after   3-4 weeks   . If helpful then can continue   medication and let us  know .   Bp is good today .

## 2024-02-21 NOTE — Progress Notes (Signed)
 Chief Complaint  Patient presents with   Medical Management of Chronic Issues    Pt is here for 3 months f/u    HPI: Brooke Pennington 73 y.o. come in for Chronic disease management  See last notes   Sleep  about 7-8  active around house but no reg exercise  Vit d  only taking  centrum silver at this time  BP seems  controlled  Didn't see nephrology and didn't do echo  but swelling has not returned  ( see notes  dr B when inc BNP  denies sob or recurrence  now on  diuretic . Not eating a whole lot   but ok   Centrum silver .  For now  no gi sx tends to eat peanut butter for protein  Butabitol  .    Asks for 14 and refill cause of stress and more headaches .Brooke Pennington Took sertraline  for a short while not a lot should she take this?  Has been on 4 x per day alprazolan for years and we have tried to wean in past and not successful.    Stress   from sisters predicament  [facility] and son sis ed use and medical issues ROS: See pertinent positives and negatives per HPI.  Past Medical History:  Diagnosis Date   Allergy    Anal fissure    hx of same   Dependence, barbiturates ? 05/01/2013   Have come to the conclusion that she is dependent on this medication based on her history and the fact that she feels better when she takes it    Eczema    H/O rectal polypectomy    ?  uncertain  if colonic or rectal colonsocopy 8 2007 gi [place]   Headache(784.0)    Hypertension    Lymphocytic gastritis    neg SBBX for celiac  in 8 2007    Macrocytosis 11/29/2011   Borderline  Check b12 folate at next visit    Medication management failed tox screen 11/27/2013   Neg for xanax  and butalbital  on urine screen . 2 /15    Menopause    age 55 with sx no hrt   Mild anemia 02/28/2014   PMR (polymyalgia rheumatica) (HCC)    Possible diagnoses by primary care physician with her anemia and elevated sedimentation rate of 60 was on prednisone but stopped it herself and then felt fine    Family History   Problem Relation Age of Onset   Coronary artery disease Father    Heart disease Father    COPD Mother    Anemia Sister        needing transfusion some> vit b12 ?    Social History   Socioeconomic History   Marital status: Married    Spouse name: Not on file   Number of children: Not on file   Years of education: Not on file   Highest education level: Not on file  Occupational History   Not on file  Tobacco Use   Smoking status: Never   Smokeless tobacco: Never   Tobacco comments:    never used tobacco  Vaping Use   Vaping status: Never Used  Substance and Sexual Activity   Alcohol use: Not Currently    Alcohol/week: 1.0 standard drink of alcohol    Types: 1 Glasses of wine per week    Comment: ocassionally   Drug use: No   Sexual activity: Yes  Other Topics Concern   Not  on file  Social History Narrative   Married remarried   he is self-employed   hhof 2    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  Has smoke detector and wears seat belts.  No firearms. No excess sun exposure. Sees dentist regularly . No depression      Only 1 car at this time   No etoh   Father passed dec 23 15 chf heart related age 8      Social Drivers of Corporate investment banker Strain: Low Risk  (02/28/2023)   Received from [facility], [facility]   Overall Financial Resource Strain (CARDIA)    Difficulty of Paying Living Expenses: Not hard at all  Food Insecurity: No Food Insecurity (02/28/2023)   Received from [facility], [facility]   Hunger Vital Sign    Worried About Running Out of Food in the Last Year: Never true    Ran Out of Food in the Last Year: Never true  Transportation Needs: No Transportation Needs (02/28/2023)   Received from [facility], [facility]   PRAPARE - Transportation    Lack of Transportation (Medical): No    Lack of Transportation (Non-Medical): No  Physical Activity: Not on file  Stress: Not on file  Social  Connections: Unknown (02/04/2022)   Received from [facility], [facility]   Social Network    Social Network: Not on file    Outpatient Medications Prior to Visit  Medication Sig Dispense Refill   ALPRAZolam  (XANAX ) 1 MG tablet TAKE 1 TABLET BY MOUTH FOUR TIMES A DAY AS NEEDED 120 tablet 1   busPIRone  (BUSPAR ) 10 MG tablet TAKE 1 AND 1/2 TABLETS BY MOUTH 3 TIMES A DAY 405 tablet 1   chlorthalidone  (HYGROTON ) 25 MG tablet TAKE 1 TABLET (25 MG TOTAL) BY MOUTH DAILY. 90 tablet 1   dexamethasone  0.5 MG/5ML elixir Take by mouth. As needed.     finasteride (PROSCAR) 5 MG tablet Take 5 mg by mouth daily.     latanoprost (XALATAN) 0.005 % ophthalmic solution Place 1 drop into both eyes at bedtime.   3   LATISSE 0.03 % ophthalmic solution APPLY 1 DROP AT THE BASE OF THE UPPER EYELASHES EVERY DAY     lisinopril  (ZESTRIL ) 10 MG tablet TAKE 1 TABLET BY MOUTH EVERY DAY 90 tablet 1   MULTIPLE VITAMIN PO Take 1 tablet by mouth daily.     nystatin-triamcinolone  (MYCOLOG II) cream SMARTSIG:sparingly Topical Twice Daily     tretinoin (RETIN-A) 0.1 % cream Apply 1 application topically at bedtime.   3   butalbital -acetaminophen-caffeine  (FIORICET) 50-325-40 MG tablet TAKE 1 TO 2 TABLETS BY MOUTH AS NEEDED AS DIRECTED 12 tablet 0   hydroquinone 4 % cream Apply topically 2 (two) times daily. (Patient not taking: Reported on 11/03/2022)     sertraline  (ZOLOFT ) 25 MG tablet TAKE 1 TABLET BY MOUTH DAILY FOR DEPRESSION. MAY INCREASE TO 50 MG AS INDICATED AFTER 4-6 WEEKS (Patient not taking: Reported on 02/21/2024) 90 tablet 1   triamcinolone  cream (KENALOG) 0.1 %  (Patient not taking: Reported on 02/21/2024)     No facility-administered medications prior to visit.     EXAM:  BP 120/68 (BP Location: Right Arm, Patient Position: Sitting, Cuff Size: Normal)   Pulse 69   Temp (!) 97.5 F (36.4 C) (Oral)   Ht 5' 2.8" (1.595 m)   Wt 108 lb (49 kg)   SpO2 98%  BMI 19.25 kg/m   Body mass index is 19.25  kg/m. Wt Readings from Last 3 Encounters:  02/21/24 108 lb (49 kg)  10/31/23 104 lb (47.2 kg)  10/13/23 119 lb 6.4 oz (54.2 kg)    GENERAL: vitals reviewed and listed above, alert, oriented, appears well hydrated and in no acute distress slender but well  HEENT: atraumatic, conjunctiva  clear, no obvious abnormalities on inspection of external nose and ears  NECK: no obvious masses on inspection palpation  LUNGS: clear to auscultation bilaterally, no wheezes, rales or rhonchi, good air movement CV: HRRR, no clubbing cyanosis or  peripheral edema nl cap refill  MS: moves all extremities without noticeable focal  abnormality PSYCH: pleasant and cooperative, no obvious depression  nl anxiety base line  Skin no bruising  Lab Results  Component Value Date   WBC 7.0 02/21/2024   HGB 9.5 (L) 02/21/2024   HCT 28.5 (L) 02/21/2024   PLT 237.0 02/21/2024   GLUCOSE 90 02/21/2024   CHOL 237 (H) 11/03/2022   TRIG 110.0 11/03/2022   HDL 64.90 11/03/2022   LDLDIRECT 168.2 12/25/2012   LDLCALC 150 (H) 11/03/2022   ALT 15 10/13/2023   AST 20 10/13/2023   NA 139 02/21/2024   K 4.7 02/21/2024   CL 103 02/21/2024   CREATININE 1.60 (H) 02/21/2024   BUN 16 02/21/2024   CO2 30 02/21/2024   TSH 1.36 10/13/2023   HGBA1C 5.5 11/03/2022   MICROALBUR 5.2 (H) 10/13/2023   BP Readings from Last 3 Encounters:  02/21/24 120/68  10/31/23 130/70  10/13/23 (!) 180/80   Last vitamin D  Lab Results  Component Value Date   VD25OH 37.40 02/21/2024     ASSESSMENT AND PLAN:  Discussed the following assessment and plan:  Stage 3b chronic kidney disease (HCC) - Plan: CBC with Differential/Platelet, Basic metabolic panel with GFR, IBC + Ferritin, Vitamin B12, VITAMIN D  25 Hydroxy (Vit-D Deficiency, Fractures)  Essential hypertension  Medication management - Plan: CBC with Differential/Platelet, Basic metabolic panel with GFR, IBC + Ferritin, Vitamin B12, VITAMIN D  25 Hydroxy (Vit-D Deficiency,  Fractures)  Anxiety state  Anemia, unspecified type - Plan: CBC with Differential/Platelet, Basic metabolic panel with GFR, IBC + Ferritin, Vitamin B12, VITAMIN D  25 Hydroxy (Vit-D Deficiency, Fractures)  Low vitamin D  level - Plan: CBC with Differential/Platelet, Basic metabolic panel with GFR, IBC + Ferritin, Vitamin B12, VITAMIN D  25 Hydroxy (Vit-D Deficiency, Fractures)  Vitamin D  deficiency She is reluctant to see any more specialists   and didn't do the echo or nephrology constul  foturately doing better  but need updated labs. Weight has maintained but this may be intentional weight loss  She may try sertraline  again since she didn't take very long  would try low dose 25 and then up to 50  Declined to inc  butalbital   Fioricet  per month but would rx 12 and 1 refill to be filled any time  Disc my reluctance to extend these meds  cause of  risk  more than benefit  Need to optimize  protein and diet  to avoid sarcopenia and   low weight  -Patient advised to return or notify health care team  if  new concerns arise.  Patient Instructions  Good to see you today  We can update  lab today .  Still limit  butalbital  for reasons discussed.  Stay on centrum silver    for now and not the other supplements  . Ok to  not take  the sertraline . If  you do take at least  4 weeks   at a given dose  25 mg    if  needed can try 50 mg per day  and see how helps after   3-4 weeks   . If helpful then can continue   medication and let us  know .   Bp is good today . Brooke Pennington K. Hertha Gergen M.D.

## 2024-02-25 ENCOUNTER — Ambulatory Visit: Payer: Self-pay | Admitting: Internal Medicine

## 2024-02-25 DIAGNOSIS — D649 Anemia, unspecified: Secondary | ICD-10-CM

## 2024-02-25 DIAGNOSIS — N1832 Chronic kidney disease, stage 3b: Secondary | ICD-10-CM

## 2024-02-25 NOTE — Progress Notes (Signed)
 Vit d is ok  But you have become more anemic Kidney function is about the same.  Stay on vit d that you are taking ready. Add b12 1000 once a day   Check cbcdiff ferritin ibc  panel vit b12  and bmp  urine  protein microalbumin. in  about 1 -2months Brooke Harada do not have to fast Dx anemia  and CKD

## 2024-03-19 ENCOUNTER — Other Ambulatory Visit: Payer: Self-pay | Admitting: Family

## 2024-03-22 NOTE — Telephone Encounter (Unsigned)
 Copied from CRM [id number]. Topic: General - Other >> Mar 22, 2024  3:31 PM Adrionna Y wrote: Reason for CRM: [facility] , since they have not heard anything and the patients almost out

## 2024-04-02 ENCOUNTER — Other Ambulatory Visit: Payer: Self-pay | Admitting: Internal Medicine

## 2024-04-10 DIAGNOSIS — M170 Bilateral primary osteoarthritis of knee: Secondary | ICD-10-CM | POA: Diagnosis not present

## 2024-04-17 ENCOUNTER — Other Ambulatory Visit: Payer: Self-pay | Admitting: Family

## 2024-05-07 ENCOUNTER — Other Ambulatory Visit: Payer: Self-pay | Admitting: Internal Medicine

## 2024-05-07 MED ORDER — LISINOPRIL 10 MG PO TABS: 10.0000 mg | ORAL_TABLET | Freq: Every day | ORAL | 1 refills | Status: DC

## 2024-05-07 NOTE — Telephone Encounter (Signed)
 Copied from CRM [id number]. Topic: Clinical - Medication Refill >> May 07, 2024  1:26 PM Leah C wrote: Medication: chlorthalidone  (HYGROTON ) 25 MG tablet; lisinopril  (ZESTRIL ) 10 MG tablet  Has the patient contacted their pharmacy? [facility] that [facility] over refill requests and have not heard anything back from the clinic. They are asking if refill can be expedited because patient is without.    This is the patient's preferred pharmacy:  [id number] [place], [place] - [address] [place] [address] Phone: [phone] Fax: [phone]  Is this the correct pharmacy for this prescription? Yes If no, delete pharmacy and type the correct one.   Has the prescription been filled recently? No  Is the patient out of the medication? Yes  Has the patient been seen for an appointment in the last year OR does the patient have an upcoming appointment? Yes  Can we respond through MyChart?   Agent: Please be advised that Rx refills may take up to 3 business days. We ask that you follow-up with your pharmacy.

## 2024-05-07 NOTE — Telephone Encounter (Signed)
 FYI Only or Action Required?: Action required by provider: medication refill request.  Patient was last seen in primary care on 02/21/2024 by Panosh, Apolinar POUR, MD.  Called Nurse Triage reporting No chief complaint on file..  Symptoms began today.  Interventions attempted: Nothing.  Symptoms are: stable.  Triage Disposition: No disposition on file.  Patient/caregiver understands and will follow disposition?:

## 2024-05-08 ENCOUNTER — Other Ambulatory Visit: Payer: Self-pay | Admitting: Internal Medicine

## 2024-05-08 MED ORDER — CHLORTHALIDONE 25 MG PO TABS: 25.0000 mg | ORAL_TABLET | Freq: Every day | ORAL | 1 refills | Status: DC

## 2024-05-08 NOTE — Telephone Encounter (Signed)
 Copied from CRM [id number]. Topic: Clinical - Medication Refill >> May 08, 2024  1:10 PM Frederich PARAS wrote: Medication:  chlorthalidone  (HYGROTON ) 25 MG tablet  Has the patient contacted their pharmacy? Yes  [facility] that they sent over refill requests and have not heard anything back from the clinic. They are asking if refill can be expedited because patient is without.     This is the patient's preferred pharmacy:  [facility] [place], [place] - [address] [place] [address] Phone: [phone] Fax: [phone]  Is this the correct pharmacy for this prescription? Yes If no, delete pharmacy and type the correct one.   Has the prescription been filled recently? No  Is the patient out of the medication? Yes  Has the patient been seen for an appointment in the last year OR does the patient have an upcoming appointment? Yes  Can we respond through MyChart? No  Agent: Please be advised that Rx refills may take up to 3 business days. We ask that you follow-up with your pharmacy.

## 2024-05-08 NOTE — Telephone Encounter (Signed)
 Copied from CRM #[id number]. Topic: Clinical - Medication Refill >> May 08, 2024  1:25 PM Frederich PARAS wrote: Medication: ALPRAZolam  (XANAX ) 1 MG  Has the patient contacted their pharmacy? Yes (Agent: If no, request that the patient contact the pharmacy for the refill. If patient does not wish to contact the pharmacy document the reason why and proceed with request.) (Agent: If yes, when and what did the pharmacy advise?)  This is the patient's preferred pharmacy:  [id number] [place], [place] - [address] [place] [address] Phone: [phone] Fax: [phone]  Is this the correct pharmacy for this prescription? Yes If no, delete pharmacy and type the correct one.   Has the prescription been filled recently? No  Is the patient out of the medication? No  Has the patient been seen for an appointment in the last year OR does the patient have an upcoming appointment? Yes  Can we respond through MyChart? No  Agent: Please be advised that Rx refills may take up to 3 business days. We ask that you follow-up with your pharmacy.

## 2024-05-18 ENCOUNTER — Other Ambulatory Visit: Payer: Self-pay | Admitting: Family

## 2024-05-20 ENCOUNTER — Telehealth: Payer: Self-pay

## 2024-05-20 NOTE — Telephone Encounter (Signed)
 Copied from CRM #[id number]. Topic: Appointments - Appointment Info/Confirmation >> May 20, 2024  3:50 PM Rosina BIRCH wrote: Patient/patient representative is calling for information regarding an appointment.   Patient called wanting to knowing she need to fast for her appointment on 9/24

## 2024-05-21 NOTE — Telephone Encounter (Signed)
 Attempted to reach pt. Left a voicemail to call us back.

## 2024-05-23 ENCOUNTER — Ambulatory Visit: Admitting: Internal Medicine

## 2024-05-28 ENCOUNTER — Other Ambulatory Visit: Payer: Self-pay | Admitting: Internal Medicine

## 2024-05-28 MED ORDER — BUSPIRONE HCL 10 MG PO TABS
ORAL_TABLET | ORAL | 1 refills | Status: AC
Start: 2024-05-28 — End: ?

## 2024-05-28 NOTE — Telephone Encounter (Signed)
 Copied from CRM [id number]. Topic: Clinical - Medication Refill >> May 28, 2024 12:47 PM [name] wrote: Medication: busPIRone  (BUSPAR ) 10 MG tablet  Has the patient contacted their pharmacy? Yes (Agent: If no, request that the patient contact the pharmacy for the refill. If patient does not wish to contact the pharmacy document the reason why and proceed with request.) (Agent: If yes, when and what did the pharmacy advise?)  This is the patient's preferred pharmacy:  [id number] [place], [place] - [address] [place] [address] Phone: [phone] Fax: [phone]  Is this the correct pharmacy for this prescription? Yes If no, delete pharmacy and type the correct one.   Has the prescription been filled recently? Yes  Is the patient out of the medication? Yes  Has the patient been seen for an appointment in the last year OR does the patient have an upcoming appointment? Yes  Can we respond through MyChart? Yes  Agent: Please be advised that Rx refills may take up to 3 business days. We ask that you follow-up with your pharmacy.

## 2024-05-29 ENCOUNTER — Telehealth: Payer: Self-pay

## 2024-05-29 DIAGNOSIS — H1013 Acute atopic conjunctivitis, bilateral: Secondary | ICD-10-CM | POA: Diagnosis not present

## 2024-05-29 NOTE — Telephone Encounter (Unsigned)
 Copied from CRM [id number]. Topic: Clinical - Medication Question >> May 29, 2024 10:59 AM [place] J wrote: Reason for CRM: Patient was wondering if her butalbital -acetaminophen-caffeine  (FIORICET) 50-325-40 MG tablet would be approved sometime today since one of her requests were approved yesterday. Patient wanted to let Dr Charlett know that she is completely out of this medication.   Patient is aware of the same day turn around time and she will be looking out for a phone call with any sort of update.

## 2024-05-29 NOTE — Telephone Encounter (Unsigned)
 Copied from CRM [id number]. Topic: Clinical - Medication Question >> May 28, 2024  1:01 PM Burnard DEL wrote: Reason for CRM: Patient had to reschedule her 8/28 appointment,she would like to know will she still be able to get her medications butalbital -acetaminophen-caffeine  (FIORICET) 50-325-40 MG tablet  and busPIRone  (BUSPAR ) 10 MG tablet.

## 2024-05-29 NOTE — Telephone Encounter (Signed)
 I just sent in refill

## 2024-05-30 NOTE — Telephone Encounter (Signed)
 Attempted to reach pt left a voicemail to call us  back.

## 2024-05-30 NOTE — Telephone Encounter (Signed)
 Attempted to reach pt. Left a detail message that Rx was sent.

## 2024-05-30 NOTE — Telephone Encounter (Signed)
 Attempted to reach pt. Left a detail message that Rx sent. If has questions, to call us  back.

## 2024-05-30 NOTE — Telephone Encounter (Unsigned)
 Copied from CRM [id number]. Topic: Clinical - Medication Question >> May 29, 2024 10:59 AM [place] J wrote: Reason for CRM: Patient was wondering if her butalbital -acetaminophen-caffeine  (FIORICET) 50-325-40 MG tablet would be approved sometime today since one of her requests were approved yesterday. Patient wanted to let Dr Charlett know that she is completely out of this medication.   Patient is aware of the same day turn around time and she will be looking out for a phone call with any sort of update. >> May 29, 2024  3:59 PM Larissa S wrote: Patient calling to check the status of medication request. Advised her that medication has not been sent to pharmacy at this time. Patient states she is out of this medication and would like to pickup all of her medications at one time.

## 2024-05-30 NOTE — Telephone Encounter (Signed)
 Contacted pt. Left a detail message that Rx was sent. To call us  back if have any question.

## 2024-06-19 ENCOUNTER — Ambulatory Visit: Admitting: Internal Medicine

## 2024-07-09 ENCOUNTER — Ambulatory Visit (INDEPENDENT_AMBULATORY_CARE_PROVIDER_SITE_OTHER): Admitting: Internal Medicine

## 2024-07-09 ENCOUNTER — Encounter: Payer: Self-pay | Admitting: Internal Medicine

## 2024-07-09 VITALS — BP 110/56 | HR 64 | Temp 97.6°F | Ht 62.8 in | Wt 102.4 lb

## 2024-07-09 DIAGNOSIS — N1832 Chronic kidney disease, stage 3b: Secondary | ICD-10-CM | POA: Diagnosis not present

## 2024-07-09 DIAGNOSIS — D649 Anemia, unspecified: Secondary | ICD-10-CM | POA: Diagnosis not present

## 2024-07-09 DIAGNOSIS — I10 Essential (primary) hypertension: Secondary | ICD-10-CM

## 2024-07-09 DIAGNOSIS — Z79899 Other long term (current) drug therapy: Secondary | ICD-10-CM

## 2024-07-09 DIAGNOSIS — F411 Generalized anxiety disorder: Secondary | ICD-10-CM

## 2024-07-09 DIAGNOSIS — R634 Abnormal weight loss: Secondary | ICD-10-CM

## 2024-07-09 LAB — CBC WITH DIFFERENTIAL/PLATELET
Basophils Absolute: 0 K/uL (ref 0.0–0.1)
Basophils Relative: 0.7 % (ref 0.0–3.0)
Eosinophils Absolute: 0.1 K/uL (ref 0.0–0.7)
Eosinophils Relative: 1.9 % (ref 0.0–5.0)
HCT: 27.3 % — ABNORMAL LOW (ref 36.0–46.0)
Hemoglobin: 9 g/dL — ABNORMAL LOW (ref 12.0–15.0)
Lymphocytes Relative: 24.4 % (ref 12.0–46.0)
Lymphs Abs: 1.4 K/uL (ref 0.7–4.0)
MCHC: 32.9 g/dL (ref 30.0–36.0)
MCV: 100.2 fl — ABNORMAL HIGH (ref 78.0–100.0)
Monocytes Absolute: 0.4 K/uL (ref 0.1–1.0)
Monocytes Relative: 7.2 % (ref 3.0–12.0)
Neutro Abs: 3.8 K/uL (ref 1.4–7.7)
Neutrophils Relative %: 65.8 % (ref 43.0–77.0)
Platelets: 278 K/uL (ref 150.0–400.0)
RBC: 2.72 Mil/uL — ABNORMAL LOW (ref 3.87–5.11)
RDW: 13.8 % (ref 11.5–15.5)
WBC: 5.8 K/uL (ref 4.0–10.5)

## 2024-07-09 LAB — BASIC METABOLIC PANEL WITH GFR
BUN: 18 mg/dL (ref 6–23)
CO2: 30 meq/L (ref 19–32)
Calcium: 9.2 mg/dL (ref 8.4–10.5)
Chloride: 100 meq/L (ref 96–112)
Creatinine, Ser: 1.65 mg/dL — ABNORMAL HIGH (ref 0.40–1.20)
GFR: 31.12 mL/min — ABNORMAL LOW (ref >60.00)
Glucose, Bld: 88 mg/dL (ref 70–99)
Potassium: 4.8 meq/L (ref 3.5–5.1)
Sodium: 137 meq/L (ref 135–145)

## 2024-07-09 LAB — IBC + FERRITIN
Ferritin: 49.7 ng/mL (ref 10.0–291.0)
Iron: 65 ug/dL (ref 42–145)
Saturation Ratios: 19.7 % — ABNORMAL LOW (ref 20.0–50.0)
TIBC: 330.4 ug/dL (ref 250.0–450.0)
Transferrin: 236 mg/dL (ref 212.0–360.0)

## 2024-07-09 LAB — MICROALBUMIN / CREATININE URINE RATIO
Creatinine,U: 97 mg/dL
Microalb Creat Ratio: 23.9 mg/g (ref 0.0–30.0)
Microalb, Ur: 2.3 mg/dL — ABNORMAL HIGH (ref 0.0–1.9)

## 2024-07-09 LAB — VITAMIN B12: Vitamin B-12: 537 pg/mL (ref 211–911)

## 2024-07-09 MED ORDER — BUTALBITAL-APAP-CAFFEINE 50-325-40 MG PO TABS: ORAL_TABLET | ORAL | 0 refills | Status: DC

## 2024-07-09 MED ORDER — ALPRAZOLAM 1 MG PO TABS: ORAL_TABLET | ORAL | 2 refills | Status: DC

## 2024-07-09 NOTE — Progress Notes (Signed)
 Chief Complaint  Patient presents with   Medical Management of Chronic Issues    HPI: Brooke Pennington 73 y.o. come in for Chronic disease management  CKD didn't get nephrology appt cause of location is wiling to go if [place] or even [place]. Asks for refill alprazolam  and  butalbital   asks for refills on rx  had some has recently  Anxiety not depression  meds given in past not that helpful  but buspar  tid not taking sertraline  .  Not hungry a lot so doesn't eat much but denies gi problem attributes to  bereavement but not depressed   Has fu urology soon  has had eval and no cancer  Taking vits  since last visit due for labs   ROS: See pertinent positives and negatives per HPI. Denis cp sob   Past Medical History:  Diagnosis Date   Allergy    Anal fissure    hx of same   Dependence, barbiturates ? 05/01/2013   Have come to the conclusion that she is dependent on this medication based on her history and the fact that she feels better when she takes it    Eczema    H/O rectal polypectomy    ?  uncertain  if colonic or rectal colonsocopy 8 2007 gi [place]   Headache(784.0)    Hypertension    Lymphocytic gastritis    neg SBBX for celiac  in 8 2007    Macrocytosis 11/29/2011   Borderline  Check b12 folate at next visit    Medication management failed tox screen 11/27/2013   Neg for xanax  and butalbital  on urine screen . 2 /15    Menopause    age 34 with sx no hrt   Mild anemia 02/28/2014   PMR (polymyalgia rheumatica)    Possible diagnoses by primary care physician with her anemia and elevated sedimentation rate of 60 was on prednisone but stopped it herself and then felt fine    Family History  Problem Relation Age of Onset   Coronary artery disease Father    Heart disease Father    COPD Mother    Anemia Sister        needing transfusion some> vit b12 ?    Social History   Socioeconomic History   Marital status: Married    Spouse name: Not on file   Number  of children: Not on file   Years of education: Not on file   Highest education level: Not on file  Occupational History   Not on file  Tobacco Use   Smoking status: Never   Smokeless tobacco: Never   Tobacco comments:    never used tobacco  Vaping Use   Vaping status: Never Used  Substance and Sexual Activity   Alcohol use: Not Currently    Alcohol/week: 1.0 standard drink of alcohol    Types: 1 Glasses of wine per week    Comment: ocassionally   Drug use: No   Sexual activity: Yes  Other Topics Concern   Not on file  Social History Narrative   Married remarried   he is self-employed   hhof 2    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  Has smoke detector and wears seat belts.  No firearms. No excess sun exposure. Sees dentist regularly . No depression      Only 1 car at this time   No etoh   Father passed dec 53  15 chf heart related age 43      Social Drivers of Corporate investment banker Strain: Low Risk  (02/28/2023)   Received from [facility]   Overall Financial Resource Strain (CARDIA)    Difficulty of Paying Living Expenses: Not hard at all  Food Insecurity: No Food Insecurity (02/28/2023)   [facility] from [facility]   Hunger Vital Sign    Within the past 12 months, you worried that your food would run out before you got the money to buy more.: Never true    Within the past 12 months, the food you bought just didn't last and you didn't have money to get more.: Never true  Transportation Needs: No Transportation Needs (02/28/2023)   Received from [facility] - Transportation    Lack of Transportation (Medical): No    Lack of Transportation (Non-Medical): No  Physical Activity: Not on file  Stress: Not on file  Social Connections: Unknown (02/04/2022)   Received from [facility]   Social Network    Social Network: Not on file    Outpatient Medications Prior to Visit  Medication Sig Dispense Refill   busPIRone   (BUSPAR ) 10 MG tablet TAKE 1 AND 1/2 TABLETS BY MOUTH 3 TIMES A DAY 405 tablet 1   chlorthalidone  (HYGROTON ) 25 MG tablet Take 1 tablet (25 mg total) by mouth daily. 90 tablet 1   dexamethasone  0.5 MG/5ML elixir Take by mouth. As needed.     finasteride (PROSCAR) 5 MG tablet Take 5 mg by mouth daily.     latanoprost (XALATAN) 0.005 % ophthalmic solution Place 1 drop into both eyes at bedtime.   3   LATISSE 0.03 % ophthalmic solution APPLY 1 DROP AT THE BASE OF THE UPPER EYELASHES EVERY DAY     lisinopril  (ZESTRIL ) 10 MG tablet Take 1 tablet (10 mg total) by mouth daily. 90 tablet 1   MULTIPLE VITAMIN PO Take 1 tablet by mouth daily.     nystatin-triamcinolone  (MYCOLOG II) cream SMARTSIG:sparingly Topical Twice Daily     tretinoin (RETIN-A) 0.1 % cream Apply 1 application topically at bedtime.   3   hydroquinone 4 % cream Apply topically 2 (two) times daily. (Patient not taking: Reported on 07/09/2024)     triamcinolone  cream (KENALOG) 0.1 %  (Patient not taking: Reported on 07/09/2024)     ALPRAZolam  (XANAX ) 1 MG tablet TAKE 1 TABLET BY MOUTH FOUR TIMES A DAY AS NEEDED 120 tablet 1   butalbital -acetaminophen-caffeine  (FIORICET) 50-325-40 MG tablet TAKE 1 TO 2 TABLETS BY MOUTH AS NEEDED AS DIRECTED 12 tablet 1   sertraline  (ZOLOFT ) 25 MG tablet TAKE 1 TABLET BY MOUTH DAILY FOR DEPRESSION. MAY INCREASE TO 50 MG AS INDICATED AFTER 4-6 WEEKS (Patient not taking: Reported on 07/09/2024) 90 tablet 1   No facility-administered medications prior to visit.     EXAM:  BP (!) 110/56 (BP Location: Left Arm, Patient Position: Sitting, Cuff Size: Normal)   Pulse 64   Temp 97.6 F (36.4 C) (Oral)   Ht 5' 2.8 (1.595 m)   Wt 102 lb 6.4 oz (46.4 kg)   SpO2 96%   BMI 18.26 kg/m   Body mass index is 18.26 kg/m. Wt Readings from Last 3 Encounters:  07/09/24 102 lb 6.4 oz (46.4 kg)  02/21/24 108 lb (49 kg)  10/31/23 104 lb (47.2 kg)    GENERAL: vitals reviewed and listed above, alert, oriented,  appears well hydrated and in no acute distress HEENT: atraumatic,  conjunctiva  clear, no obvious abnormalities on inspection of external nose and ears  NECK: no obvious masses on inspection palpation  LUNGS: clear to auscultation bilaterally, no wheezes, rales or rhonchi, good air movement CV: HRRR, no clubbing cyanosis or  peripheral edema nl cap refill  MS: moves all extremities without noticeable focal  abnormality PSYCH: pleasant and cooperative, no obvious depression or anxiety Lab Results  Component Value Date   WBC 5.8 07/09/2024   HGB 9.0 (L) 07/09/2024   HCT 27.3 (L) 07/09/2024   PLT 278.0 07/09/2024   GLUCOSE 88 07/09/2024   CHOL 237 (H) 11/03/2022   TRIG 110.0 11/03/2022   HDL 64.90 11/03/2022   LDLDIRECT 168.2 12/25/2012   LDLCALC 150 (H) 11/03/2022   ALT 15 10/13/2023   AST 20 10/13/2023   NA 137 07/09/2024   K 4.8 07/09/2024   CL 100 07/09/2024   CREATININE 1.65 (H) 07/09/2024   BUN 18 07/09/2024   CO2 30 07/09/2024   TSH 1.36 10/13/2023   HGBA1C 5.5 11/03/2022   MICROALBUR 2.3 (H) 07/09/2024   BP Readings from Last 3 Encounters:  07/09/24 (!) 110/56  02/21/24 120/68  10/31/23 130/70    ASSESSMENT AND PLAN:  Discussed the following assessment and plan:  Stage 3b chronic kidney disease (HCC) - Plan: CBC with Differential/Platelet, Microalbumin/Creatinine Ratio, Urine, Basic Metabolic Panel, Vitamin B12, IBC + Ferritin  Essential hypertension - Plan: CBC with Differential/Platelet  Medication management - Plan: CBC with Differential/Platelet  Anxiety state - Plan: CBC with Differential/Platelet  Anemia, unspecified type - Plan: CBC with Differential/Platelet, Microalbumin/Creatinine Ratio, Urine, Basic Metabolic Panel, Vitamin B12, IBC + Ferritin  Weight loss  Long term prescription benzodiazepine use Declines  pharmacy complex disease consult  thinks wouldn't help .  Encouraged to add protein and calories   declines further work up thinking from  bereaved but active told her risk of low bmi bone health other .  Ht controlled  Last proteinuria   consider adding sglt2  but [facility] [place] [facility] [facility] [place] .  She requests more than one refill for butalbital  and  I declines for now as to avoid  falling into regular use as in past.  Fear of decreasing the alprazolam  and she is aware  she should consider wean( we tried in past)  she could decrease one dose   to 0.5 in med day as takes am and hs and 2 x in between . I am not sure shw wants to wean.  And has fear of weaning   -Patient advised to return or notify health care team  if  new concerns arise.  Patient Instructions  Advise   46  grams of protein per day .  Concern about  weight loss.  Want to optimize bone  health .  Try boost or similar with protein.  Get weight back up .   Update labs  . Today   will look into  more local nephrologist  like in [place].   Want you to try decreasing one dose of  alprazolam   to 1/2 pill one dose a day .   Lab today and then decide of adding me for kidneys .  Quintana Canelo K. Makhari Dovidio M.D.

## 2024-07-09 NOTE — Patient Instructions (Addendum)
 Advise   46  grams of protein per day .  Concern about  weight loss.  Want to optimize bone  health .  Try boost or similar with protein.  Get weight back up .   Update labs  . Today   will look into  more local nephrologist  like in [place].   Want you to try decreasing one dose of  alprazolam   to 1/2 pill one dose a day .   Lab today and then decide of adding me for kidneys .

## 2024-07-12 ENCOUNTER — Telehealth: Payer: Self-pay

## 2024-07-12 DIAGNOSIS — N1832 Chronic kidney disease, stage 3b: Secondary | ICD-10-CM

## 2024-07-12 NOTE — Telephone Encounter (Signed)
 Follow up with the nephrology referral at [facility]. They inform that they attempted to reach pt 3x. Pt didn't call to schedule an appt. The referral is now close.   Re-order nephrology referral dx ckd 3 and noted Per provider- nephrology referral  either [place] or  [place] area  dx ckd 3 b.

## 2024-07-12 NOTE — Telephone Encounter (Signed)
-----   Message from Apolinar Eastern sent at 07/09/2024  6:24 PM EDT ----- K please see what happened to nephrology referral  either [place] or  [place] area  dx ckd 3 b. She said would be willing to go closer to home

## 2024-07-15 DIAGNOSIS — M170 Bilateral primary osteoarthritis of knee: Secondary | ICD-10-CM | POA: Diagnosis not present

## 2024-07-17 ENCOUNTER — Telehealth: Payer: Self-pay

## 2024-07-17 NOTE — Telephone Encounter (Signed)
 Copied from CRM [id number]. Topic: General - Other >> Jul 08, 2024  2:07 PM Paige D wrote: Reason for CRM: Pt calling to ask if she has labs tomorrow and if labs are needing to be fasting labs as she can't remember. Please reach out to pt in regards to labs, pt has her appt tomorrow morning for her follow up with pcp

## 2024-07-19 NOTE — Telephone Encounter (Signed)
 Pt had lab work on 07/09/2024 at her visit. Attempt to follow up with pt. Left a voicemail to call us  back.

## 2024-07-23 DIAGNOSIS — K080 Exfoliation of teeth due to systemic causes: Secondary | ICD-10-CM | POA: Diagnosis not present

## 2024-08-01 ENCOUNTER — Other Ambulatory Visit: Payer: Self-pay | Admitting: Internal Medicine

## 2024-08-07 DIAGNOSIS — H401131 Primary open-angle glaucoma, bilateral, mild stage: Secondary | ICD-10-CM | POA: Diagnosis not present

## 2024-08-26 ENCOUNTER — Other Ambulatory Visit: Payer: Self-pay | Admitting: Internal Medicine

## 2024-09-21 ENCOUNTER — Other Ambulatory Visit: Payer: Self-pay | Admitting: Internal Medicine

## 2024-10-01 ENCOUNTER — Other Ambulatory Visit: Payer: Self-pay | Admitting: Internal Medicine

## 2024-10-03 ENCOUNTER — Telehealth: Payer: Self-pay | Admitting: *Deleted

## 2024-10-03 NOTE — Telephone Encounter (Signed)
 Copied from CRM #[id number]. Topic: Clinical - Medication Question >> Oct 03, 2024  2:05 PM Rea ORN wrote: Reason for CRM: Pt calling to see if PCP will be responding to the Alprazolam  rx refill from [facility] this week. The refill request was sent on 10/02/23. Pt is asking that this message be sent high priority.   Please call back to advise,  [phone]

## 2024-10-07 NOTE — Telephone Encounter (Signed)
 Attempted to reach pt. Left a detail message that Rx sent.

## 2024-10-07 NOTE — Telephone Encounter (Signed)
 Was sent in today

## 2024-10-15 ENCOUNTER — Other Ambulatory Visit: Payer: Self-pay | Admitting: Internal Medicine

## 2024-10-18 ENCOUNTER — Other Ambulatory Visit: Payer: Self-pay | Admitting: Internal Medicine

## 2024-11-12 ENCOUNTER — Ambulatory Visit: Admitting: Internal Medicine
